# Patient Record
Sex: Female | Born: 1940 | Race: White | Hispanic: No | Marital: Married | Smoking: Never smoker
Health system: Southern US, Community
[De-identification: ages and names within clinical notes are randomized; demographics above are authoritative.]

## PROBLEM LIST (undated history)

## (undated) DIAGNOSIS — D329 Benign neoplasm of meninges, unspecified: Secondary | ICD-10-CM

## (undated) DIAGNOSIS — D649 Anemia, unspecified: Secondary | ICD-10-CM

## (undated) DIAGNOSIS — R569 Unspecified convulsions: Secondary | ICD-10-CM

## (undated) DIAGNOSIS — E119 Type 2 diabetes mellitus without complications: Secondary | ICD-10-CM

## (undated) DIAGNOSIS — M1990 Unspecified osteoarthritis, unspecified site: Secondary | ICD-10-CM

## (undated) DIAGNOSIS — E785 Hyperlipidemia, unspecified: Secondary | ICD-10-CM

## (undated) DIAGNOSIS — M109 Gout, unspecified: Secondary | ICD-10-CM

## (undated) DIAGNOSIS — K219 Gastro-esophageal reflux disease without esophagitis: Secondary | ICD-10-CM

## (undated) DIAGNOSIS — M359 Systemic involvement of connective tissue, unspecified: Secondary | ICD-10-CM

## (undated) DIAGNOSIS — L409 Psoriasis, unspecified: Secondary | ICD-10-CM

## (undated) DIAGNOSIS — E039 Hypothyroidism, unspecified: Secondary | ICD-10-CM

## (undated) DIAGNOSIS — I10 Essential (primary) hypertension: Secondary | ICD-10-CM

## (undated) DIAGNOSIS — C801 Malignant (primary) neoplasm, unspecified: Secondary | ICD-10-CM

## (undated) HISTORY — PX: THYROID SURGERY: SHX805

## (undated) HISTORY — PX: BRAIN MENINGIOMA EXCISION: SHX576

## (undated) HISTORY — PX: BRAIN SURGERY: SHX531

## (undated) HISTORY — PX: ESOPHAGOGASTRODUODENOSCOPY: SHX1529

## (undated) HISTORY — PX: PARTIAL NEPHRECTOMY: SHX414

## (undated) HISTORY — PX: APPENDECTOMY: SHX54

## (undated) HISTORY — PX: BACK SURGERY: SHX140

## (undated) HISTORY — PX: ABDOMINAL HYSTERECTOMY: SHX81

## (undated) HISTORY — PX: CRANIOTOMY: SHX93

## (undated) HISTORY — PX: REDUCTION MAMMAPLASTY: SUR839

## (undated) HISTORY — PX: COLONOSCOPY: SHX174

## (undated) HISTORY — PX: HEMORRHOID SURGERY: SHX153

---

## 2007-08-17 ENCOUNTER — Ambulatory Visit: Payer: Self-pay | Admitting: Gastroenterology

## 2007-10-22 ENCOUNTER — Ambulatory Visit: Payer: Self-pay | Admitting: Family Medicine

## 2008-06-13 ENCOUNTER — Ambulatory Visit: Payer: Self-pay | Admitting: Oncology

## 2008-06-20 ENCOUNTER — Inpatient Hospital Stay: Payer: Self-pay | Admitting: Internal Medicine

## 2008-06-24 ENCOUNTER — Ambulatory Visit: Payer: Self-pay | Admitting: Oncology

## 2008-07-04 DIAGNOSIS — K219 Gastro-esophageal reflux disease without esophagitis: Secondary | ICD-10-CM | POA: Insufficient documentation

## 2008-07-04 DIAGNOSIS — E039 Hypothyroidism, unspecified: Secondary | ICD-10-CM | POA: Insufficient documentation

## 2008-07-04 DIAGNOSIS — E7800 Pure hypercholesterolemia, unspecified: Secondary | ICD-10-CM | POA: Insufficient documentation

## 2008-07-11 ENCOUNTER — Ambulatory Visit: Payer: Self-pay | Admitting: Oncology

## 2008-08-05 ENCOUNTER — Ambulatory Visit: Payer: Self-pay | Admitting: Oncology

## 2008-10-25 ENCOUNTER — Ambulatory Visit: Payer: Self-pay | Admitting: Family Medicine

## 2008-12-27 ENCOUNTER — Ambulatory Visit: Payer: Self-pay | Admitting: Urology

## 2009-02-08 ENCOUNTER — Inpatient Hospital Stay: Payer: Self-pay | Admitting: *Deleted

## 2009-02-10 ENCOUNTER — Ambulatory Visit: Payer: Self-pay | Admitting: Oncology

## 2009-02-17 ENCOUNTER — Ambulatory Visit: Payer: Self-pay | Admitting: Oncology

## 2009-03-02 ENCOUNTER — Ambulatory Visit: Payer: Self-pay | Admitting: Family Medicine

## 2009-03-13 ENCOUNTER — Ambulatory Visit: Payer: Self-pay | Admitting: Oncology

## 2009-04-21 ENCOUNTER — Ambulatory Visit: Payer: Self-pay

## 2009-04-21 ENCOUNTER — Ambulatory Visit: Payer: Self-pay | Admitting: Oncology

## 2009-05-13 ENCOUNTER — Ambulatory Visit: Payer: Self-pay

## 2010-01-11 ENCOUNTER — Ambulatory Visit: Payer: Self-pay | Admitting: Oncology

## 2010-01-16 ENCOUNTER — Ambulatory Visit: Payer: Self-pay | Admitting: Family Medicine

## 2010-01-30 ENCOUNTER — Ambulatory Visit: Payer: Self-pay

## 2010-02-02 ENCOUNTER — Ambulatory Visit: Payer: Self-pay | Admitting: Oncology

## 2010-02-23 ENCOUNTER — Ambulatory Visit: Payer: Self-pay | Admitting: Urology

## 2011-01-21 ENCOUNTER — Ambulatory Visit: Payer: Self-pay | Admitting: Family Medicine

## 2011-01-31 ENCOUNTER — Ambulatory Visit: Payer: Self-pay

## 2011-02-25 ENCOUNTER — Ambulatory Visit: Payer: Self-pay | Admitting: Urology

## 2011-02-27 ENCOUNTER — Ambulatory Visit: Payer: Self-pay | Admitting: Oncology

## 2011-03-14 ENCOUNTER — Ambulatory Visit: Payer: Self-pay | Admitting: Oncology

## 2011-03-14 ENCOUNTER — Ambulatory Visit: Payer: Self-pay | Admitting: Urology

## 2011-03-28 DIAGNOSIS — C649 Malignant neoplasm of unspecified kidney, except renal pelvis: Secondary | ICD-10-CM | POA: Insufficient documentation

## 2011-05-14 DIAGNOSIS — C642 Malignant neoplasm of left kidney, except renal pelvis: Secondary | ICD-10-CM

## 2011-05-14 HISTORY — DX: Malignant neoplasm of left kidney, except renal pelvis: C64.2

## 2011-11-25 ENCOUNTER — Ambulatory Visit: Payer: Self-pay | Admitting: Gastroenterology

## 2012-01-22 ENCOUNTER — Ambulatory Visit: Payer: Self-pay | Admitting: Family Medicine

## 2012-02-19 ENCOUNTER — Ambulatory Visit: Payer: Self-pay

## 2012-02-26 ENCOUNTER — Ambulatory Visit: Payer: Self-pay | Admitting: Internal Medicine

## 2012-03-13 ENCOUNTER — Ambulatory Visit: Payer: Self-pay | Admitting: Internal Medicine

## 2012-04-17 DIAGNOSIS — N3946 Mixed incontinence: Secondary | ICD-10-CM | POA: Insufficient documentation

## 2012-04-17 DIAGNOSIS — N281 Cyst of kidney, acquired: Secondary | ICD-10-CM | POA: Insufficient documentation

## 2012-04-17 DIAGNOSIS — N23 Unspecified renal colic: Secondary | ICD-10-CM | POA: Insufficient documentation

## 2013-01-22 ENCOUNTER — Ambulatory Visit: Payer: Self-pay | Admitting: Family Medicine

## 2014-02-25 ENCOUNTER — Ambulatory Visit: Payer: Self-pay | Admitting: Family Medicine

## 2014-04-25 ENCOUNTER — Emergency Department: Payer: Self-pay | Admitting: Emergency Medicine

## 2014-04-25 LAB — COMPREHENSIVE METABOLIC PANEL
ALBUMIN: 3.3 g/dL — AB (ref 3.4–5.0)
AST: 20 U/L (ref 15–37)
Alkaline Phosphatase: 68 U/L
Anion Gap: 9 (ref 7–16)
BILIRUBIN TOTAL: 0.6 mg/dL (ref 0.2–1.0)
BUN: 21 mg/dL — ABNORMAL HIGH (ref 7–18)
Calcium, Total: 8.4 mg/dL — ABNORMAL LOW (ref 8.5–10.1)
Chloride: 94 mmol/L — ABNORMAL LOW (ref 98–107)
Co2: 28 mmol/L (ref 21–32)
Creatinine: 1.19 mg/dL (ref 0.60–1.30)
EGFR (African American): 57 — ABNORMAL LOW
EGFR (Non-African Amer.): 47 — ABNORMAL LOW
Glucose: 102 mg/dL — ABNORMAL HIGH (ref 65–99)
Osmolality: 266 (ref 275–301)
POTASSIUM: 3.5 mmol/L (ref 3.5–5.1)
SGPT (ALT): 37 U/L
SODIUM: 131 mmol/L — AB (ref 136–145)
Total Protein: 6.5 g/dL (ref 6.4–8.2)

## 2014-04-25 LAB — URINALYSIS, COMPLETE
BLOOD: NEGATIVE
Bilirubin,UR: NEGATIVE
Glucose,UR: NEGATIVE mg/dL (ref 0–75)
Hyaline Cast: 3
Ketone: NEGATIVE
LEUKOCYTE ESTERASE: NEGATIVE
Nitrite: NEGATIVE
PH: 5 (ref 4.5–8.0)
Protein: 30
SPECIFIC GRAVITY: 1.026 (ref 1.003–1.030)
Squamous Epithelial: 1
WBC UR: 3 /HPF (ref 0–5)

## 2014-04-25 LAB — TROPONIN I: Troponin-I: <0.02 ng/mL

## 2014-04-25 LAB — CBC WITH DIFFERENTIAL/PLATELET
BASOS PCT: 0.5 %
Basophil #: 0 10*3/uL (ref 0.0–0.1)
EOS ABS: 0.1 10*3/uL (ref 0.0–0.7)
Eosinophil %: 1.6 %
HCT: 35 % (ref 35.0–47.0)
HGB: 11.6 g/dL — ABNORMAL LOW (ref 12.0–16.0)
LYMPHS ABS: 0.9 10*3/uL — AB (ref 1.0–3.6)
Lymphocyte %: 10.6 %
MCH: 26.9 pg (ref 26.0–34.0)
MCHC: 33.1 g/dL (ref 32.0–36.0)
MCV: 81 fL (ref 80–100)
MONO ABS: 0.7 x10 3/mm (ref 0.2–0.9)
MONOS PCT: 8.7 %
NEUTROS ABS: 6.3 10*3/uL (ref 1.4–6.5)
Neutrophil %: 78.6 %
Platelet: 273 10*3/uL (ref 150–440)
RBC: 4.31 10*6/uL (ref 3.80–5.20)
RDW: 16.5 % — ABNORMAL HIGH (ref 11.5–14.5)
WBC: 8.1 10*3/uL (ref 3.6–11.0)

## 2014-05-02 ENCOUNTER — Ambulatory Visit: Payer: Self-pay | Admitting: Adult Health

## 2015-06-21 ENCOUNTER — Other Ambulatory Visit: Payer: Self-pay | Admitting: Family Medicine

## 2015-06-21 DIAGNOSIS — Z1231 Encounter for screening mammogram for malignant neoplasm of breast: Secondary | ICD-10-CM

## 2015-06-22 ENCOUNTER — Ambulatory Visit
Admission: RE | Admit: 2015-06-22 | Discharge: 2015-06-22 | Disposition: A | Discharge status: Home / Self Care | Payer: Medicare Other | Source: Ambulatory Visit | Attending: Family Medicine | Admitting: Family Medicine

## 2015-06-22 DIAGNOSIS — Z1231 Encounter for screening mammogram for malignant neoplasm of breast: Secondary | ICD-10-CM | POA: Diagnosis not present

## 2015-08-09 ENCOUNTER — Other Ambulatory Visit: Payer: Self-pay | Admitting: Orthopedic Surgery

## 2015-08-09 DIAGNOSIS — M25561 Pain in right knee: Secondary | ICD-10-CM

## 2015-08-09 DIAGNOSIS — M1711 Unilateral primary osteoarthritis, right knee: Secondary | ICD-10-CM

## 2015-08-30 ENCOUNTER — Ambulatory Visit
Admission: RE | Admit: 2015-08-30 | Discharge: 2015-08-30 | Disposition: A | Discharge status: Home / Self Care | Payer: Medicare Other | Source: Ambulatory Visit | Attending: Orthopedic Surgery | Admitting: Orthopedic Surgery

## 2015-08-30 DIAGNOSIS — M2241 Chondromalacia patellae, right knee: Secondary | ICD-10-CM | POA: Insufficient documentation

## 2015-08-30 DIAGNOSIS — S83271A Complex tear of lateral meniscus, current injury, right knee, initial encounter: Secondary | ICD-10-CM | POA: Diagnosis not present

## 2015-08-30 DIAGNOSIS — M1711 Unilateral primary osteoarthritis, right knee: Secondary | ICD-10-CM | POA: Insufficient documentation

## 2015-08-30 DIAGNOSIS — M25561 Pain in right knee: Secondary | ICD-10-CM | POA: Insufficient documentation

## 2015-09-12 ENCOUNTER — Other Ambulatory Visit: Payer: Medicare Other

## 2015-09-12 ENCOUNTER — Encounter: Payer: Self-pay | Admitting: *Deleted

## 2015-09-12 NOTE — Patient Instructions (Signed)
  Your procedure is scheduled on: 09-21-15 (THURSDAY) Report to [facility] To find out your arrival time please call [phone] between 1PM - 3PM on 09-20-15 [facility])  Remember: Instructions that are not followed completely may result in serious medical risk, up to and including death, or upon the discretion of your surgeon and anesthesiologist your surgery may need to be rescheduled.    _X___ 1. Do not eat food or drink liquids after midnight. No gum chewing or hard candies.     _X___ 2. No Alcohol for 24 hours before or after surgery.   ____ 3. Bring all medications with you on the day of surgery if instructed.    _X___ 4. Notify your doctor if there is any change in your medical condition     (cold, fever, infections).     Do not wear jewelry, make-up, hairpins, clips or nail polish.  Do not wear lotions, powders, or perfumes. You may wear deodorant.  Do not shave 48 hours prior to surgery. Men may shave face and neck.  Do not bring valuables to the hospital.    [facility] is not responsible for any belongings or valuables.               Contacts, dentures or bridgework may not be worn into surgery.  Leave your suitcase in the car. After surgery it may be brought to your room.  For patients admitted to the hospital, discharge time is determined by your treatment team.   Patients discharged the day of surgery will not be allowed to drive home.   Please read over the following fact sheets that you were given:      _X___ Take these medicines the morning of surgery with A SIP OF WATER:    1. LEVOTHYROXINE  2. KEPPRA  3. PRILOSEC  4.TAKE AN EXTRA PRILOSEC ON Wednesday NIGHT BEFORE BED  5.  6.  ____ Fleet Enema (as directed)   _X___ Use CHG Soap as directed  ____ Use inhalers on the day of surgery  ____ Stop metformin 2 days prior to surgery    ____ Take 1/2 of usual insulin dose the night before surgery and none on the morning of  surgery.   ____ Stop Coumadin/Plavix/aspirin-N/A  _X___ Stop Anti-inflammatories-NO NSAIDS OR ASPIRIN PRODUCTS-TYLENOL OK TO TAKE    ____ Stop supplements until after surgery.    ____ Bring C-Pap to the hospital.

## 2015-09-15 ENCOUNTER — Ambulatory Visit
Admission: RE | Admit: 2015-09-15 | Discharge: 2015-09-15 | Disposition: A | Discharge status: Home / Self Care | Payer: Medicare Other | Source: Ambulatory Visit | Attending: Orthopedic Surgery | Admitting: Orthopedic Surgery

## 2015-09-15 DIAGNOSIS — Z01812 Encounter for preprocedural laboratory examination: Secondary | ICD-10-CM | POA: Diagnosis not present

## 2015-09-15 DIAGNOSIS — Z01810 Encounter for preprocedural cardiovascular examination: Secondary | ICD-10-CM | POA: Diagnosis present

## 2015-09-15 DIAGNOSIS — I10 Essential (primary) hypertension: Secondary | ICD-10-CM | POA: Diagnosis present

## 2015-09-15 LAB — POTASSIUM: POTASSIUM: 3.8 mmol/L (ref 3.5–5.1)

## 2015-09-21 ENCOUNTER — Encounter: Payer: Self-pay | Admitting: *Deleted

## 2015-09-21 ENCOUNTER — Ambulatory Visit: Payer: Medicare Other | Admitting: Anesthesiology

## 2015-09-21 ENCOUNTER — Encounter
Admission: RE | Disposition: A | Discharge status: Home / Self Care | Payer: Self-pay | Source: Ambulatory Visit | Attending: Orthopedic Surgery

## 2015-09-21 ENCOUNTER — Ambulatory Visit
Admission: RE | Admit: 2015-09-21 | Discharge: 2015-09-21 | Disposition: A | Discharge status: Home / Self Care | Payer: Medicare Other | Source: Ambulatory Visit | Attending: Orthopedic Surgery | Admitting: Orthopedic Surgery

## 2015-09-21 DIAGNOSIS — Z803 Family history of malignant neoplasm of breast: Secondary | ICD-10-CM | POA: Diagnosis not present

## 2015-09-21 DIAGNOSIS — G40409 Other generalized epilepsy and epileptic syndromes, not intractable, without status epilepticus: Secondary | ICD-10-CM | POA: Insufficient documentation

## 2015-09-21 DIAGNOSIS — D649 Anemia, unspecified: Secondary | ICD-10-CM | POA: Insufficient documentation

## 2015-09-21 DIAGNOSIS — Z905 Acquired absence of kidney: Secondary | ICD-10-CM | POA: Diagnosis not present

## 2015-09-21 DIAGNOSIS — Z79899 Other long term (current) drug therapy: Secondary | ICD-10-CM | POA: Diagnosis not present

## 2015-09-21 DIAGNOSIS — Z800 Family history of malignant neoplasm of digestive organs: Secondary | ICD-10-CM | POA: Insufficient documentation

## 2015-09-21 DIAGNOSIS — E785 Hyperlipidemia, unspecified: Secondary | ICD-10-CM | POA: Insufficient documentation

## 2015-09-21 DIAGNOSIS — E119 Type 2 diabetes mellitus without complications: Secondary | ICD-10-CM | POA: Insufficient documentation

## 2015-09-21 DIAGNOSIS — Z801 Family history of malignant neoplasm of trachea, bronchus and lung: Secondary | ICD-10-CM | POA: Diagnosis not present

## 2015-09-21 DIAGNOSIS — Z825 Family history of asthma and other chronic lower respiratory diseases: Secondary | ICD-10-CM | POA: Diagnosis not present

## 2015-09-21 DIAGNOSIS — Z8261 Family history of arthritis: Secondary | ICD-10-CM | POA: Diagnosis not present

## 2015-09-21 DIAGNOSIS — L409 Psoriasis, unspecified: Secondary | ICD-10-CM | POA: Diagnosis not present

## 2015-09-21 DIAGNOSIS — Z8553 Personal history of malignant neoplasm of renal pelvis: Secondary | ICD-10-CM | POA: Diagnosis not present

## 2015-09-21 DIAGNOSIS — S83281A Other tear of lateral meniscus, current injury, right knee, initial encounter: Secondary | ICD-10-CM | POA: Diagnosis not present

## 2015-09-21 DIAGNOSIS — Z888 Allergy status to other drugs, medicaments and biological substances status: Secondary | ICD-10-CM | POA: Insufficient documentation

## 2015-09-21 DIAGNOSIS — E669 Obesity, unspecified: Secondary | ICD-10-CM | POA: Insufficient documentation

## 2015-09-21 DIAGNOSIS — Y939 Activity, unspecified: Secondary | ICD-10-CM | POA: Diagnosis not present

## 2015-09-21 DIAGNOSIS — Z808 Family history of malignant neoplasm of other organs or systems: Secondary | ICD-10-CM | POA: Diagnosis not present

## 2015-09-21 DIAGNOSIS — Z833 Family history of diabetes mellitus: Secondary | ICD-10-CM | POA: Diagnosis not present

## 2015-09-21 DIAGNOSIS — X58XXXA Exposure to other specified factors, initial encounter: Secondary | ICD-10-CM | POA: Insufficient documentation

## 2015-09-21 DIAGNOSIS — I10 Essential (primary) hypertension: Secondary | ICD-10-CM | POA: Diagnosis not present

## 2015-09-21 DIAGNOSIS — Z90710 Acquired absence of both cervix and uterus: Secondary | ICD-10-CM | POA: Insufficient documentation

## 2015-09-21 DIAGNOSIS — Z6829 Body mass index (BMI) 29.0-29.9, adult: Secondary | ICD-10-CM | POA: Diagnosis not present

## 2015-09-21 DIAGNOSIS — S83241A Other tear of medial meniscus, current injury, right knee, initial encounter: Secondary | ICD-10-CM | POA: Diagnosis not present

## 2015-09-21 DIAGNOSIS — Z7984 Long term (current) use of oral hypoglycemic drugs: Secondary | ICD-10-CM | POA: Insufficient documentation

## 2015-09-21 DIAGNOSIS — E890 Postprocedural hypothyroidism: Secondary | ICD-10-CM | POA: Diagnosis not present

## 2015-09-21 HISTORY — DX: Psoriasis, unspecified: L40.9

## 2015-09-21 HISTORY — DX: Anemia, unspecified: D64.9

## 2015-09-21 HISTORY — DX: Hyperlipidemia, unspecified: E78.5

## 2015-09-21 HISTORY — DX: Type 2 diabetes mellitus without complications: E11.9

## 2015-09-21 HISTORY — DX: Gastro-esophageal reflux disease without esophagitis: K21.9

## 2015-09-21 HISTORY — DX: Essential (primary) hypertension: I10

## 2015-09-21 HISTORY — DX: Unspecified convulsions: R56.9

## 2015-09-21 HISTORY — DX: Unspecified osteoarthritis, unspecified site: M19.90

## 2015-09-21 HISTORY — DX: Hypothyroidism, unspecified: E03.9

## 2015-09-21 HISTORY — DX: Malignant (primary) neoplasm, unspecified: C80.1

## 2015-09-21 HISTORY — PX: KNEE ARTHROSCOPY: SHX127

## 2015-09-21 LAB — GLUCOSE, CAPILLARY
GLUCOSE-CAPILLARY: 80 mg/dL (ref 65–99)
GLUCOSE-CAPILLARY: 81 mg/dL (ref 65–99)

## 2015-09-21 SURGERY — ARTHROSCOPY, KNEE
Anesthesia: General | Site: Knee | Laterality: Right

## 2015-09-21 MED ORDER — LACTATED RINGERS IV SOLN
INTRAVENOUS | Status: DC | PRN
  Administered 2015-09-21: 11:00:00 via INTRAVENOUS

## 2015-09-21 MED ORDER — KETOROLAC TROMETHAMINE 30 MG/ML IJ SOLN
INTRAMUSCULAR | Status: DC | PRN
  Administered 2015-09-21: 30 mg via INTRAVENOUS

## 2015-09-21 MED ORDER — BUPIVACAINE-EPINEPHRINE (PF) 0.5% -1:200000 IJ SOLN
INTRAMUSCULAR | Status: AC
  Filled 2015-09-21: qty 30

## 2015-09-21 MED ORDER — SODIUM CHLORIDE 0.9 % IV SOLN
INTRAVENOUS | Status: DC
  Administered 2015-09-21: 11:00:00 via INTRAVENOUS

## 2015-09-21 MED ORDER — FENTANYL CITRATE (PF) 100 MCG/2ML IJ SOLN
INTRAMUSCULAR | Status: DC | PRN
  Administered 2015-09-21: 100 ug via INTRAVENOUS

## 2015-09-21 MED ORDER — PROPOFOL 10 MG/ML IV BOLUS
INTRAVENOUS | Status: DC | PRN
Start: 2015-09-21 — End: 2015-09-21
  Administered 2015-09-21: 40 mg via INTRAVENOUS
  Administered 2015-09-21: 150 mg via INTRAVENOUS

## 2015-09-21 MED ORDER — FENTANYL CITRATE (PF) 100 MCG/2ML IJ SOLN: 25.0000 ug | INTRAMUSCULAR | Status: DC | PRN

## 2015-09-21 MED ORDER — BUPIVACAINE-EPINEPHRINE (PF) 0.5% -1:200000 IJ SOLN
INTRAMUSCULAR | Status: DC | PRN
  Administered 2015-09-21: 20 mL

## 2015-09-21 MED ORDER — FENTANYL CITRATE (PF) 100 MCG/2ML IJ SOLN
25.0000 ug | INTRAMUSCULAR | Status: DC | PRN
  Administered 2015-09-21 (×2): 25 ug via INTRAVENOUS

## 2015-09-21 MED ORDER — LIDOCAINE HCL (CARDIAC) 20 MG/ML IV SOLN
INTRAVENOUS | Status: DC | PRN
  Administered 2015-09-21: 100 mg via INTRAVENOUS

## 2015-09-21 MED ORDER — ONDANSETRON HCL 4 MG/2ML IJ SOLN
4.0000 mg | Freq: Once | INTRAMUSCULAR | Status: AC | PRN
  Administered 2015-09-21: 4 mg via INTRAVENOUS

## 2015-09-21 MED ORDER — HYDROCODONE-ACETAMINOPHEN 5-325 MG PO TABS: 1.0000 | ORAL_TABLET | Freq: Four times a day (QID) | ORAL | Status: DC | PRN

## 2015-09-21 MED ORDER — ONDANSETRON HCL 4 MG/2ML IJ SOLN: 4.0000 mg | Freq: Once | INTRAMUSCULAR | Status: DC | PRN

## 2015-09-21 MED ORDER — FENTANYL CITRATE (PF) 100 MCG/2ML IJ SOLN
INTRAMUSCULAR | Status: AC
  Administered 2015-09-21: 25 ug via INTRAVENOUS
  Filled 2015-09-21: qty 2

## 2015-09-21 SURGICAL SUPPLY — 27 items
BANDAGE ACE 4X5 VEL STRL LF (GAUZE/BANDAGES/DRESSINGS) ×3 IMPLANT
BANDAGE ELASTIC 4 LF NS (GAUZE/BANDAGES/DRESSINGS) ×3 IMPLANT
BLADE FULL RADIUS 3.5 (BLADE) IMPLANT
BLADE INCISOR PLUS 4.5 (BLADE) ×3 IMPLANT
BLADE SHAVER 4.5 DBL SERAT CV (CUTTER) IMPLANT
BLADE SHAVER 4.5X7 STR FR (MISCELLANEOUS) IMPLANT
CHLORAPREP W/TINT 26ML (MISCELLANEOUS) ×3 IMPLANT
CUFF TOURN 24 STER (MISCELLANEOUS) ×3 IMPLANT
CUFF TOURN 30 STER DUAL PORT (MISCELLANEOUS) IMPLANT
CUTTER AGGRESSIVE+ 3.5 (CUTTER) ×3 IMPLANT
GAUZE SPONGE 4X4 12PLY STRL (GAUZE/BANDAGES/DRESSINGS) ×3 IMPLANT
GLOVE SURG ORTHO 9.0 STRL STRW (GLOVE) ×3 IMPLANT
GOWN STRL REUS W/ TWL LRG LVL3 (GOWN DISPOSABLE) ×1 IMPLANT
GOWN STRL REUS W/TWL LRG LVL3 (GOWN DISPOSABLE) ×2
GOWN SURG XXL (GOWNS) ×3 IMPLANT
IV LACTATED RINGER IRRG 3000ML (IV SOLUTION) ×8
IV LR IRRIG 3000ML ARTHROMATIC (IV SOLUTION) ×4 IMPLANT
KIT RM TURNOVER STRD PROC AR (KITS) ×3 IMPLANT
MANIFOLD NEPTUNE II (INSTRUMENTS) ×3 IMPLANT
PACK ARTHROSCOPY KNEE (MISCELLANEOUS) ×3 IMPLANT
SET TUBE SUCT SHAVER OUTFL 24K (TUBING) ×3 IMPLANT
SET TUBE TIP INTRA-ARTICULAR (MISCELLANEOUS) ×3 IMPLANT
SUT ETHILON 4-0 (SUTURE) ×2
SUT ETHILON 4-0 FS2 18XMFL BLK (SUTURE) ×1
SUTURE ETHLN 4-0 FS2 18XMF BLK (SUTURE) ×1 IMPLANT
TUBING ARTHRO INFLOW-ONLY STRL (TUBING) ×3 IMPLANT
WAND HAND CNTRL MULTIVAC 50 (MISCELLANEOUS) ×3 IMPLANT

## 2015-09-21 NOTE — H&P (Signed)
Reviewed paper H+P, will be scanned into chart. No changes noted.  

## 2015-09-21 NOTE — Anesthesia Postprocedure Evaluation (Signed)
Anesthesia Post Note  Patient: Vanessa Esparza  Procedure(s) Performed: Procedure(s) (LRB): ARTHROSCOPY KNEE, LATERAL AND MEDIAL MENISECTOMY (Right)  Patient location during evaluation: PACU Anesthesia Type: General Level of consciousness: awake Pain management: pain level controlled Vital Signs Assessment: post-procedure vital signs reviewed and stable Respiratory status: spontaneous breathing Cardiovascular status: blood pressure returned to baseline Anesthetic complications: no    Last Vitals:  Filed Vitals:   09/21/15 1028 09/21/15 1147  BP: 164/71 103/60  Pulse: 63 68  Temp: 36.5 C 36.1 C  Resp: 20 13    Last Pain:  Filed Vitals:   09/21/15 1154  PainSc: Asleep                 VAN STAVEREN,Umaiza Matusik

## 2015-09-21 NOTE — Anesthesia Procedure Notes (Signed)
Procedure Name: LMA Insertion Date/Time: 09/21/2015 11:20 AM Performed by: Nelda Marseille Pre-anesthesia Checklist: Patient identified, Patient being monitored, Timeout performed, Emergency Drugs available and Suction available Patient Re-evaluated:Patient Re-evaluated prior to inductionOxygen Delivery Method: Circle system utilized Preoxygenation: Pre-oxygenation with 100% oxygen Intubation Type: IV induction Ventilation: Mask ventilation without difficulty LMA: LMA inserted LMA Size: 3.5 Tube type: Oral Number of attempts: 1 Placement Confirmation: positive ETCO2 and breath sounds checked- equal and bilateral Tube secured with: Tape Dental Injury: Teeth and Oropharynx as per pre-operative assessment

## 2015-09-21 NOTE — Transfer of Care (Signed)
Immediate Anesthesia Transfer of Care Note  Patient: Vanessa Esparza  Procedure(s) Performed: Procedure(s): ARTHROSCOPY KNEE, LATERAL AND MEDIAL MENISECTOMY (Right)  Patient Location: PACU  Anesthesia Type:General  Level of Consciousness: sedated  Airway & Oxygen Therapy: Patient Spontanous Breathing and Patient connected to face mask oxygen  Post-op Assessment: Report given to RN and Post -op Vital signs reviewed and stable  Post vital signs: Reviewed and stable  Last Vitals:  Filed Vitals:   09/21/15 1028  BP: 164/71  Pulse: 63  Temp: 36.5 C  Resp: 20    Last Pain: There were no vitals filed for this visit.       Complications: No apparent anesthesia complications

## 2015-09-21 NOTE — Anesthesia Preprocedure Evaluation (Addendum)
Anesthesia Evaluation  Patient identified by MRN, date of birth, ID band Patient awake    Reviewed: Allergy & Precautions, NPO status , Patient's Chart, lab work & pertinent test results  History of Anesthesia Complications Negative for: history of anesthetic complications  Airway Mallampati: III       Dental  (+) Teeth Intact   Pulmonary neg pulmonary ROS,    breath sounds clear to auscultation       Cardiovascular Exercise Tolerance: Good hypertension, Pt. on medications  Rhythm:Regular Rate:Normal     Neuro/Psych Seizures -, Well Controlled,     GI/Hepatic Neg liver ROS, GERD  Medicated,  Endo/Other  diabetes, Well Controlled, Type 2, Oral Hypoglycemic AgentsHypothyroidism   Renal/GU Partial Nephrectomy     Musculoskeletal   Abdominal (+) + obese,   Peds  Hematology  (+) anemia ,   Anesthesia Other Findings   Reproductive/Obstetrics                            Anesthesia Physical Anesthesia Plan  ASA: II  Anesthesia Plan: General   Post-op Pain Management:    Induction: Intravenous  Airway Management Planned: LMA  Additional Equipment:   Intra-op Plan:   Post-operative Plan: Extubation in OR  Informed Consent: I have reviewed the patients History and Physical, chart, labs and discussed the procedure including the risks, benefits and alternatives for the proposed anesthesia with the patient or authorized representative who has indicated his/her understanding and acceptance.     Plan Discussed with: CRNA  Anesthesia Plan Comments:         Anesthesia Quick Evaluation

## 2015-09-21 NOTE — Op Note (Signed)
09/21/2015  11:45 AM  PATIENT:  Vanessa Esparza  75 y.o. female  PRE-OPERATIVE DIAGNOSIS:  LATERAL MENISCUS TEAR, POSSIBLE MEDIAL  POST-OPERATIVE DIAGNOSIS:  LATERAL AND MEDIAL MENISCUS TEAR  PROCEDURE:  Procedure(s): ARTHROSCOPY KNEE, LATERAL AND MEDIAL MENISECTOMY (Right)  SURGEON: Laurene Footman, MD  ASSISTANTS: None  ANESTHESIA:   general  EBL:     BLOOD ADMINISTERED:none  DRAINS: none   LOCAL MEDICATIONS USED:  MARCAINE     SPECIMEN:  No Specimen  DISPOSITION OF SPECIMEN:  N/A  COUNTS:  YES  TOURNIQUET:    IMPLANTS: None  DICTATION: .Dragon Dictation patient was brought to the operating room and after adequate general anesthesia was obtained the right leg was prepped draped in sterile fashion with an arthroscopic leg holder applied. After prepping and draping in the usual sterile manner appropriate patient identification and timeout procedure were completed. An inferior lateral portal was made and then initial inspection the patellofemoral joint appeared normal. Coming around medially and inferior medial portal was made and there is moderate synovitis and a small tear at the middle third that was debrided with use of a ArthriCare wand the anterior cruciate ligament was intact them articular cartilage was relatively normal the medial compartment laterally there was some articular cartilage loss superficially on the femur and extensive fibrillation of the tibial side there was huge tear of the lateral meniscus bucket-handle tear which is displaced and attached this was debrided with a shaver getting the bulk of the meniscus removed and then ArthriCare wand was used to shave the meniscus getting back to stable margins with essentially the entire meniscus having been involved in the tear after adequate debridement and addressing the meniscus tear circumferentially the knee was irrigated until clear the gutters were checked and are no loose bodies after thorough irrigation  argentation was withdrawn. Wounds were closed with simple 4-0 nylon and 10 cc of half percent Sensorcaine with epinephrine infiltrated into each portal. Xeroform 4 x 4 web roll and Ace wrap applied  PLAN OF CARE: Discharge to home after PACU  PATIENT DISPOSITION:  PACU - hemodynamically stable.

## 2015-09-21 NOTE — Discharge Instructions (Signed)
Take aspirin 325 mg a day until walking normally. Keep dressing clean and dry. If bandage slides down the leg, remove entire bandage put a Band-Aid over each of the 2 incisions and then reapply the Ace wrap only. Pain medicine as directed

## 2015-09-22 ENCOUNTER — Encounter: Payer: Self-pay | Admitting: Orthopedic Surgery

## 2016-08-16 ENCOUNTER — Ambulatory Visit (INDEPENDENT_AMBULATORY_CARE_PROVIDER_SITE_OTHER): Payer: Medicare Other | Admitting: Podiatry

## 2016-08-16 ENCOUNTER — Encounter: Payer: Self-pay | Admitting: Podiatry

## 2016-08-16 DIAGNOSIS — L409 Psoriasis, unspecified: Secondary | ICD-10-CM | POA: Diagnosis not present

## 2016-08-16 DIAGNOSIS — E785 Hyperlipidemia, unspecified: Secondary | ICD-10-CM | POA: Insufficient documentation

## 2016-08-16 DIAGNOSIS — M79675 Pain in left toe(s): Secondary | ICD-10-CM | POA: Diagnosis not present

## 2016-08-16 DIAGNOSIS — I10 Essential (primary) hypertension: Secondary | ICD-10-CM | POA: Insufficient documentation

## 2016-08-16 DIAGNOSIS — E079 Disorder of thyroid, unspecified: Secondary | ICD-10-CM | POA: Insufficient documentation

## 2016-08-16 DIAGNOSIS — D649 Anemia, unspecified: Secondary | ICD-10-CM | POA: Insufficient documentation

## 2016-08-16 DIAGNOSIS — E119 Type 2 diabetes mellitus without complications: Secondary | ICD-10-CM | POA: Insufficient documentation

## 2016-08-16 DIAGNOSIS — L603 Nail dystrophy: Secondary | ICD-10-CM

## 2016-08-16 MED ORDER — DOXYCYCLINE HYCLATE 100 MG PO TABS: 100.0000 mg | ORAL_TABLET | Freq: Two times a day (BID) | ORAL | 0 refills | Status: DC

## 2016-08-16 NOTE — Progress Notes (Signed)
   Subjective:    Patient ID: Vanessa Esparza, female    DOB: [birth year], 76 y.o.   MRN: [id number]  HPI    Review of Systems  Skin: Positive for rash.  All other systems reviewed and are negative.      Objective:   Physical Exam        Assessment & Plan:

## 2016-08-16 NOTE — Patient Instructions (Signed)

## 2016-08-16 NOTE — Progress Notes (Signed)
Patient ID: Vanessa Esparza, female   DOB: 1941-04-02, 76 y.o.   MRN: [id number]   Subjective:  Patient presents today as a new patient for evaluation of thickened, painful toenails 2-5 of the left foot. Patient states that they've been thick and painful for several years now. Onset is gradual. Aggravating factors include shoe gear. Patient is unable to walk in shoes without pain. Patient tries to trim her nails however she cannot due to the thickness. She is requesting removal of the toenails. Patient states that she does have a history of psoriasis.    Objective/Physical Exam General: The patient is alert and oriented x3 in no acute distress.  Dermatology: Thickened, discolored nails noted 2-5 of the left foot.  Vascular: Palpable pedal pulses bilaterally. No edema or erythema noted. Capillary refill within normal limits.  Neurological: Epicritic and protective threshold grossly intact bilaterally.   Musculoskeletal Exam: Range of motion within normal limits to all pedal and ankle joints bilateral. Muscle strength 5/5 in all groups bilateral.    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 #2 today the decision was made to perform total permanent nail avulsions to digits 2-3 of the left foot. We will perform total permanent nail avulsions to digits 4-5 left foot on her follow-up appointment in 2 weeks. #3 local anesthesia infiltration was utilized consisting of a 50-50 mixture of 2% lidocaine plain with 0.5% Marcaine plain as a digital block 2-3 left foot. #4 nails were avulsed followed by 782 seconds applications of phenol and alcohol flush. #5 dry sterile dressings applied #6 prescription for doxycycline #7 post-care instructions were provided #8 return to clinic in 2 weeks for removal of toenails digits 4-5 left foot   Edrick Kins, DPM [facility]  Dr. Edrick Kins,  [address]                                        [place], [place] [address]                Office [phone]  Fax [phone]

## 2016-09-03 ENCOUNTER — Ambulatory Visit: Payer: Medicare Other | Admitting: Podiatry

## 2016-09-10 ENCOUNTER — Ambulatory Visit (INDEPENDENT_AMBULATORY_CARE_PROVIDER_SITE_OTHER): Payer: Medicare Other | Admitting: Podiatry

## 2016-09-10 DIAGNOSIS — L409 Psoriasis, unspecified: Secondary | ICD-10-CM | POA: Diagnosis not present

## 2016-09-10 DIAGNOSIS — L603 Nail dystrophy: Secondary | ICD-10-CM

## 2016-09-10 DIAGNOSIS — M79675 Pain in left toe(s): Secondary | ICD-10-CM | POA: Diagnosis not present

## 2016-09-10 MED ORDER — DOXYCYCLINE HYCLATE 100 MG PO TABS: 100.0000 mg | ORAL_TABLET | Freq: Two times a day (BID) | ORAL | 0 refills | Status: DC

## 2016-09-11 NOTE — Progress Notes (Signed)
Patient ID: Vanessa Esparza, female   DOB: [birth year], 76 y.o.   MRN: [id number]   Subjective:  Patient presents today for follow up evaluation of thickened, painful toenails 4-5 of the left foot. Patient states that they've been thick and painful for several years now. Onset is gradual. Aggravating factors include shoe gear. Patient is unable to walk in shoes without pain. Patient tries to trim her nails however she cannot due to the thickness. She is requesting removal of the toenails. She had toenails 2 and 3 removed at last visit and reports some continued tenderness with palpation of the areas. She has been using antibiotic ointment as direct.    Objective/Physical Exam General: The patient is alert and oriented x3 in no acute distress.  Dermatology: Thickened, discolored nails noted 4-5 of the left foot.  Vascular: Palpable pedal pulses bilaterally. No edema or erythema noted. Capillary refill within normal limits.  Neurological: Epicritic and protective threshold grossly intact bilaterally.   Musculoskeletal Exam: Range of motion within normal limits to all pedal and ankle joints bilateral. Muscle strength 5/5 in all groups bilateral.    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 #2 today the decision was made to perform total permanent nail avulsions to digits 4-5 of the left foot. #3 local anesthesia infiltration was utilized consisting of a 50-50 mixture of 2% lidocaine plain with 0.5% Marcaine plain as a digital block to toes 4-5 left foot. #4 nails were avulsed followed by 629 seconds applications of phenol and alcohol flush. #5 dry sterile dressings applied #6 prescription for doxycycline #14 given #7 post-care instructions were provided #8 return to clinic in 2 weeks   Edrick Kins, DPM [facility]  Dr. Edrick Kins, [address]                                          [place], [place] [address]                Office [phone]  Fax [phone]

## 2016-09-17 ENCOUNTER — Other Ambulatory Visit: Payer: Self-pay | Admitting: Gastroenterology

## 2016-09-17 DIAGNOSIS — R1310 Dysphagia, unspecified: Secondary | ICD-10-CM

## 2016-09-20 ENCOUNTER — Ambulatory Visit
Admission: RE | Admit: 2016-09-20 | Discharge: 2016-09-20 | Disposition: A | Discharge status: Home / Self Care | Payer: Medicare Other | Source: Ambulatory Visit | Attending: Gastroenterology | Admitting: Gastroenterology

## 2016-09-20 DIAGNOSIS — K449 Diaphragmatic hernia without obstruction or gangrene: Secondary | ICD-10-CM | POA: Insufficient documentation

## 2016-09-20 DIAGNOSIS — R1310 Dysphagia, unspecified: Secondary | ICD-10-CM | POA: Diagnosis not present

## 2016-09-24 ENCOUNTER — Ambulatory Visit (INDEPENDENT_AMBULATORY_CARE_PROVIDER_SITE_OTHER): Payer: Medicare Other | Admitting: Podiatry

## 2016-09-24 DIAGNOSIS — S91209D Unspecified open wound of unspecified toe(s) with damage to nail, subsequent encounter: Secondary | ICD-10-CM

## 2016-09-24 DIAGNOSIS — S91109D Unspecified open wound of unspecified toe(s) without damage to nail, subsequent encounter: Secondary | ICD-10-CM

## 2016-09-24 DIAGNOSIS — M79676 Pain in unspecified toe(s): Secondary | ICD-10-CM | POA: Diagnosis not present

## 2016-09-25 NOTE — Progress Notes (Signed)
   Subjective: Patient presents today 2 weeks post ingrown nail total permanent nail avulsion procedure. Patient states that the toes and nail fold is feeling much better. She reports mild drainage.  Objective: Skin is warm, dry and supple. Nail and respective nail fold appears to be healing appropriately. Open wound to the associated nail fold with a granular wound base and moderate amount of fibrotic tissue. Minimal drainage noted. Mild erythema around the periungual region likely due to phenol chemical matricectomy.  Assessment: #1 postop permanent total nail avulsion digits 2-5 left #2 open wound periungual nail fold of respective digit.   Plan of care: #1 patient was evaluated  #2 debridement of open wound was performed to the periungual border of the respective toe using a currette. Antibiotic ointment and Band-Aid was applied. #3 patient is to return to clinic on a PRN  basis.   Edrick Kins, DPM [facility]  Dr. Edrick Kins, [address]                                        [place], [place] [address]                Office [phone]  Fax [phone]

## 2016-09-30 ENCOUNTER — Other Ambulatory Visit: Payer: Self-pay | Admitting: Family Medicine

## 2016-09-30 DIAGNOSIS — Z85528 Personal history of other malignant neoplasm of kidney: Secondary | ICD-10-CM | POA: Insufficient documentation

## 2016-09-30 DIAGNOSIS — Z1231 Encounter for screening mammogram for malignant neoplasm of breast: Secondary | ICD-10-CM

## 2016-10-01 DIAGNOSIS — M0609 Rheumatoid arthritis without rheumatoid factor, multiple sites: Secondary | ICD-10-CM | POA: Insufficient documentation

## 2016-10-17 ENCOUNTER — Ambulatory Visit
Admission: RE | Admit: 2016-10-17 | Discharge: 2016-10-17 | Disposition: A | Discharge status: Home / Self Care | Payer: Medicare Other | Source: Ambulatory Visit | Attending: Family Medicine | Admitting: Family Medicine

## 2016-10-17 DIAGNOSIS — Z1231 Encounter for screening mammogram for malignant neoplasm of breast: Secondary | ICD-10-CM | POA: Diagnosis not present

## 2017-01-28 ENCOUNTER — Ambulatory Visit
Admission: RE | Admit: 2017-01-28 | Discharge: 2017-01-28 | Disposition: A | Discharge status: Home / Self Care | Payer: Medicare Other | Source: Ambulatory Visit | Attending: Gastroenterology | Admitting: Gastroenterology

## 2017-01-28 ENCOUNTER — Ambulatory Visit: Payer: Medicare Other | Admitting: Anesthesiology

## 2017-01-28 ENCOUNTER — Encounter
Admission: RE | Disposition: A | Discharge status: Home / Self Care | Payer: Self-pay | Source: Ambulatory Visit | Attending: Gastroenterology

## 2017-01-28 DIAGNOSIS — K449 Diaphragmatic hernia without obstruction or gangrene: Secondary | ICD-10-CM | POA: Insufficient documentation

## 2017-01-28 DIAGNOSIS — K635 Polyp of colon: Secondary | ICD-10-CM | POA: Diagnosis not present

## 2017-01-28 DIAGNOSIS — Z7984 Long term (current) use of oral hypoglycemic drugs: Secondary | ICD-10-CM | POA: Diagnosis not present

## 2017-01-28 DIAGNOSIS — Z79899 Other long term (current) drug therapy: Secondary | ICD-10-CM | POA: Insufficient documentation

## 2017-01-28 DIAGNOSIS — R569 Unspecified convulsions: Secondary | ICD-10-CM | POA: Diagnosis not present

## 2017-01-28 DIAGNOSIS — Q398 Other congenital malformations of esophagus: Secondary | ICD-10-CM | POA: Diagnosis not present

## 2017-01-28 DIAGNOSIS — D123 Benign neoplasm of transverse colon: Secondary | ICD-10-CM | POA: Insufficient documentation

## 2017-01-28 DIAGNOSIS — K5730 Diverticulosis of large intestine without perforation or abscess without bleeding: Secondary | ICD-10-CM | POA: Insufficient documentation

## 2017-01-28 DIAGNOSIS — Z800 Family history of malignant neoplasm of digestive organs: Secondary | ICD-10-CM | POA: Diagnosis not present

## 2017-01-28 DIAGNOSIS — K2950 Unspecified chronic gastritis without bleeding: Secondary | ICD-10-CM | POA: Insufficient documentation

## 2017-01-28 DIAGNOSIS — L409 Psoriasis, unspecified: Secondary | ICD-10-CM | POA: Insufficient documentation

## 2017-01-28 DIAGNOSIS — K640 First degree hemorrhoids: Secondary | ICD-10-CM | POA: Diagnosis not present

## 2017-01-28 DIAGNOSIS — E119 Type 2 diabetes mellitus without complications: Secondary | ICD-10-CM | POA: Insufficient documentation

## 2017-01-28 DIAGNOSIS — R1310 Dysphagia, unspecified: Secondary | ICD-10-CM | POA: Insufficient documentation

## 2017-01-28 DIAGNOSIS — Z1211 Encounter for screening for malignant neoplasm of colon: Secondary | ICD-10-CM | POA: Diagnosis not present

## 2017-01-28 DIAGNOSIS — K219 Gastro-esophageal reflux disease without esophagitis: Secondary | ICD-10-CM | POA: Insufficient documentation

## 2017-01-28 DIAGNOSIS — I10 Essential (primary) hypertension: Secondary | ICD-10-CM | POA: Diagnosis not present

## 2017-01-28 DIAGNOSIS — K319 Disease of stomach and duodenum, unspecified: Secondary | ICD-10-CM | POA: Insufficient documentation

## 2017-01-28 DIAGNOSIS — E039 Hypothyroidism, unspecified: Secondary | ICD-10-CM | POA: Insufficient documentation

## 2017-01-28 HISTORY — PX: ESOPHAGOGASTRODUODENOSCOPY (EGD) WITH PROPOFOL: SHX5813

## 2017-01-28 HISTORY — PX: COLONOSCOPY WITH PROPOFOL: SHX5780

## 2017-01-28 LAB — GLUCOSE, CAPILLARY: Glucose-Capillary: 108 mg/dL — ABNORMAL HIGH (ref 65–99)

## 2017-01-28 SURGERY — COLONOSCOPY WITH PROPOFOL
Anesthesia: General

## 2017-01-28 MED ORDER — GLYCOPYRROLATE 0.2 MG/ML IJ SOLN
INTRAMUSCULAR | Status: DC | PRN
  Administered 2017-01-28: 0.2 mg via INTRAVENOUS

## 2017-01-28 MED ORDER — LIDOCAINE HCL (PF) 2 % IJ SOLN
INTRAMUSCULAR | Status: AC
  Filled 2017-01-28: qty 4

## 2017-01-28 MED ORDER — PHENYLEPHRINE HCL 10 MG/ML IJ SOLN
INTRAMUSCULAR | Status: DC | PRN
  Administered 2017-01-28: 200 ug via INTRAVENOUS
  Administered 2017-01-28: 100 ug via INTRAVENOUS
  Administered 2017-01-28 (×2): 200 ug via INTRAVENOUS

## 2017-01-28 MED ORDER — GLYCOPYRROLATE 0.2 MG/ML IJ SOLN
INTRAMUSCULAR | Status: AC
  Filled 2017-01-28: qty 1

## 2017-01-28 MED ORDER — SODIUM CHLORIDE 0.9 % IV SOLN
INTRAVENOUS | Status: DC
Start: 2017-01-28 — End: 2017-01-28

## 2017-01-28 MED ORDER — PROPOFOL 500 MG/50ML IV EMUL
INTRAVENOUS | Status: AC
  Filled 2017-01-28: qty 50

## 2017-01-28 MED ORDER — LIDOCAINE HCL (CARDIAC) 20 MG/ML IV SOLN
INTRAVENOUS | Status: DC | PRN
Start: 2017-01-28 — End: 2017-01-28
  Administered 2017-01-28: 80 mg via INTRAVENOUS

## 2017-01-28 MED ORDER — SODIUM CHLORIDE 0.9 % IV SOLN
INTRAVENOUS | Status: DC
Start: 2017-01-28 — End: 2017-01-28
  Administered 2017-01-28: 08:00:00 via INTRAVENOUS

## 2017-01-28 MED ORDER — PROPOFOL 500 MG/50ML IV EMUL
INTRAVENOUS | Status: DC | PRN
  Administered 2017-01-28: 150 ug/kg/min via INTRAVENOUS

## 2017-01-28 MED ORDER — PROPOFOL 10 MG/ML IV BOLUS
INTRAVENOUS | Status: DC | PRN
  Administered 2017-01-28: 80 mg via INTRAVENOUS

## 2017-01-28 NOTE — Anesthesia Postprocedure Evaluation (Signed)
Anesthesia Post Note  Patient: Vanessa Esparza  Procedure(s) Performed: Procedure(s) (LRB): COLONOSCOPY WITH PROPOFOL (N/A) ESOPHAGOGASTRODUODENOSCOPY (EGD) WITH PROPOFOL (N/A)  Patient location during evaluation: Endoscopy Anesthesia Type: General Level of consciousness: awake and alert Pain management: pain level controlled Vital Signs Assessment: post-procedure vital signs reviewed and stable Respiratory status: spontaneous breathing, nonlabored ventilation, respiratory function stable and patient connected to nasal cannula oxygen Cardiovascular status: blood pressure returned to baseline and stable Postop Assessment: no apparent nausea or vomiting Anesthetic complications: no     Last Vitals:  Vitals:   01/28/17 1007 01/28/17 1017  BP: (!) 120/56 130/73  Pulse: 75 68  Resp: 14 16  Temp:    SpO2: 100% 97%    Last Pain:  Vitals:   01/28/17 0947  TempSrc: Tympanic                 Martha Clan

## 2017-01-28 NOTE — Anesthesia Preprocedure Evaluation (Signed)
Anesthesia Evaluation  Patient identified by MRN, date of birth, ID band Patient awake    Reviewed: Allergy & Precautions, H&P , NPO status , Patient's Chart, lab work & pertinent test results, reviewed documented beta blocker date and time   History of Anesthesia Complications Negative for: history of anesthetic complications  Airway Mallampati: I  TM Distance: >3 FB Neck ROM: full    Dental  (+) Dental Advidsory Given, Caps, Teeth Intact, Missing   Pulmonary neg pulmonary ROS,           Cardiovascular Exercise Tolerance: Good hypertension, (-) angina(-) CAD, (-) Past MI, (-) Cardiac Stents and (-) CABG (-) dysrhythmias (-) Valvular Problems/Murmurs     Neuro/Psych Seizures -,  negative psych ROS   GI/Hepatic Neg liver ROS, GERD  ,  Endo/Other  diabetes, Well ControlledHypothyroidism   Renal/GU Renal disease  negative genitourinary   Musculoskeletal   Abdominal   Peds  Hematology negative hematology ROS (+)   Anesthesia Other Findings Past Medical History: No date: Anemia No date: Arthritis No date: Cancer ([place])     Comment:  RENAL CELL No date: Diabetes mellitus without complication (HCC) No date: GERD (gastroesophageal reflux disease) No date: Hyperlipidemia No date: Hypertension No date: Hypothyroidism No date: Psoriasis No date: Seizures ([place])     Comment:  LAST SEIZURE ON 02-08-09   Reproductive/Obstetrics negative OB ROS                             Anesthesia Physical Anesthesia Plan  ASA: III  Anesthesia Plan: General   Post-op Pain Management:    Induction: Intravenous  PONV Risk Score and Plan: 3 and Propofol infusion  Airway Management Planned: Natural Airway and Nasal Cannula  Additional Equipment:   Intra-op Plan:   Post-operative Plan:   Informed Consent: I have reviewed the patients History and Physical, chart, labs and discussed the procedure  including the risks, benefits and alternatives for the proposed anesthesia with the patient or authorized representative who has indicated his/her understanding and acceptance.   Dental Advisory Given  Plan Discussed with: Anesthesiologist, CRNA and Surgeon  Anesthesia Plan Comments:         Anesthesia Quick Evaluation

## 2017-01-28 NOTE — Anesthesia Post-op Follow-up Note (Signed)
Anesthesia QCDR form completed.        

## 2017-01-28 NOTE — H&P (Signed)
Outpatient short stay form Pre-procedure 01/28/2017 8:29 AM Vanessa Sails MD  Primary Physician: Dr. [name]  Reason for visit:  EGD and colonoscopy  History of present illness:  Patient is a 76 year old female presenting today as above. She has family history of colon cancer primary relative, father. Her last colonoscopy was about 5 years ago. Has been having some very mild dysphagia issues. A barium swallow indicated a possible narrowing at the GE junction. She does not regurgitate foods. She does take a proton pump inhibitor.  She takes no aspirin or blood thinning agents. She tolerated her prep well.    Current Facility-Administered Medications:  .  0.9 %  sodium chloride infusion, , Intravenous, Continuous, Vanessa Sails, MD, Last Rate: 20 mL/hr at 01/28/17 0747 .  0.9 %  sodium chloride infusion, , Intravenous, Continuous, Vanessa Sails, MD  Prescriptions Prior to Admission  Medication Sig Dispense Refill Last Dose  . ALLOPURINOL PO Take by mouth daily.   01/26/2017  . colchicine 0.6 MG tablet Take 0.6 mg by mouth 2 (two) times daily.   01/26/2017  . levETIRAcetam (KEPPRA) 500 MG tablet Take 500 mg by mouth 2 (two) times daily.   01/28/2017 at Unknown time  . levothyroxine (SYNTHROID, LEVOTHROID) 150 MCG tablet Take 150 mcg by mouth daily before breakfast.   01/28/2017 at Unknown time  . lisinopril-hydrochlorothiazide (PRINZIDE,ZESTORETIC) 20-25 MG tablet Take 1 tablet by mouth daily.   01/28/2017 at Unknown time  . methotrexate 2.5 MG tablet Take 10 mg by mouth once a week.   01/24/2017  . omeprazole (PRILOSEC) 20 MG capsule Take 20 mg by mouth every morning.   01/28/2017 at Unknown time  . calcium carbonate (OS-CAL) 600 MG TABS tablet Take 600 mg by mouth daily.   01/25/2017  . doxycycline (VIBRA-TABS) 100 MG tablet Take 1 tablet (100 mg total) by mouth 2 (two) times daily. (Patient not taking: Reported on 01/28/2017) 14 tablet 0 Completed Course at Unknown time  .  folic acid (FOLVITE) 1 MG tablet Take 1 mg by mouth daily.   01/21/2017  . glipiZIDE (GLUCOTROL) 5 MG tablet Take 5 mg by mouth daily before breakfast.   01/26/2017     Allergies  Allergen Reactions  . Statins Other (See Comments)    BLOATED FEELING, CHEST FULLNESS     Past Medical History:  Diagnosis Date  . Anemia   . Arthritis   . Cancer (HCC)    RENAL CELL  . Diabetes mellitus without complication ([place])   . GERD (gastroesophageal reflux disease)   . Hyperlipidemia   . Hypertension   . Hypothyroidism   . Psoriasis   . Seizures ([place])    LAST SEIZURE ON 02-08-09    Review of systems:      Physical Exam    Heart and lungs: Regular rate and rhythm without rub or gallop, lungs are bilaterally clear.    HEENT: Normocephalic atraumatic eyes are anicteric    Other:     Pertinant exam for procedure: Soft nontender nondistended bowel sounds positive normoactive.    Planned proceedures: EGD, colonoscopy and indicated procedures. I have discussed the risks benefits and complications of procedures to include not limited to bleeding, infection, perforation and the risk of sedation and the patient wishes to proceed.    Vanessa Sails, MD Gastroenterology 01/28/2017  8:29 AM

## 2017-01-28 NOTE — Transfer of Care (Signed)
Immediate Anesthesia Transfer of Care Note  Patient: Vanessa Esparza  Procedure(s) Performed: Procedure(s): COLONOSCOPY WITH PROPOFOL (N/A) ESOPHAGOGASTRODUODENOSCOPY (EGD) WITH PROPOFOL (N/A)  Patient Location: Endoscopy Unit  Anesthesia Type:General  Level of Consciousness: sedated  Airway & Oxygen Therapy: Patient Spontanous Breathing and Patient connected to nasal cannula oxygen  Post-op Assessment: Report given to RN and Post -op Vital signs reviewed and stable  Post vital signs: Reviewed and stable  Last Vitals:  Vitals:   01/28/17 0752  BP: (!) 151/73  Pulse: 74  Resp: 20  Temp: (!) 36 C  SpO2: 100%    Last Pain:  Vitals:   01/28/17 0752  TempSrc: Tympanic         Complications: No apparent anesthesia complications

## 2017-01-28 NOTE — Op Note (Signed)
[facility] Gastroenterology Patient Name: Vanessa Esparza Procedure Date: 01/28/2017 8:32 AM MRN: [id number] Account #: [id number] Date of Birth: January 15, 1941 Admit Type: Outpatient Age: 76 Room: [facility] ENDO ROOM 3 Gender: Female Note Status: Finalized Procedure:            Upper GI endoscopy Indications:          Dysphagia Providers:            Lollie Sails, MD Referring MD:         Irven Easterly. Kary Kos, MD (Referring MD) Medicines:            Monitored Anesthesia Care Complications:        No immediate complications. Procedure:            Pre-Anesthesia Assessment:                       - ASA Grade Assessment: III - A patient with severe                        systemic disease.                       After obtaining informed consent, the endoscope was                        passed under direct vision. Throughout the procedure,                        the patient's blood pressure, pulse, and oxygen                        saturations were monitored continuously. The Endoscope                        was introduced through the mouth, and advanced to the                        third part of duodenum. The upper GI endoscopy was                        accomplished without difficulty. The patient tolerated                        the procedure well. Findings:      The middle third of the esophagus and lower third of the esophagus were       moderately tortuous.      A small hiatal hernia was present.      The Z-line was variable. Biopsies were taken with a cold forceps for       histology.      Patchy mild inflammation characterized by congestion (edema), erythema       and linear erosions was found in the gastric body.      Patchy moderate inflammation characterized by erosions, erythema and       granularity was found at the incisura. Biopsies were taken with a cold       forceps for histology.      The cardia and gastric fundus were normal on retroflexion otherwise.      The examined duodenum was normal. Impression:           - Tortuous esophagus.                       -  Small hiatal hernia.                       - Z-line variable. Biopsied.                       - Erosive gastritis.                       - Erosive gastritis. Biopsied.                       - Normal examined duodenum. Recommendation:       - Use Protonix (pantoprazole) 40 mg PO daily daily.                       - Await pathology results.                       - Return to GI clinic in 6 weeks. Procedure Code(s):    --- Professional ---                       [phone], Esophagogastroduodenoscopy, flexible, transoral;                        with biopsy, single or multiple Diagnosis Code(s):    --- Professional ---                       Q39.9, Congenital malformation of esophagus, unspecified                       K44.9, Diaphragmatic hernia without obstruction or                        gangrene                       K22.8, Other specified diseases of esophagus                       K29.60, Other gastritis without bleeding                       R13.10, Dysphagia, unspecified CPT copyright 2016 American Medical Association. All rights reserved. The codes documented in this report are preliminary and upon coder review may  be revised to meet current compliance requirements. Lollie Sails, MD 01/28/2017 9:09:59 AM This report has been signed electronically. Number of Addenda: 0 Note Initiated On: 01/28/2017 8:32 AM      [facility]

## 2017-01-28 NOTE — Op Note (Signed)
[facility] Gastroenterology Patient Name: Vanessa Esparza Procedure Date: 01/28/2017 8:32 AM MRN: [id number] Account #: [id number] Date of Birth: 1940-08-19 Admit Type: Outpatient Age: 76 Room: [facility] ENDO ROOM 3 Gender: Female Note Status: Finalized Procedure:            Colonoscopy Indications:          Family history of colon cancer in a first-degree                        relative Providers:            Lollie Sails, MD Referring MD:         Irven Easterly. Kary Kos, MD (Referring MD) Medicines:            Monitored Anesthesia Care Complications:        No immediate complications. Procedure:            Pre-Anesthesia Assessment:                       - ASA Grade Assessment: III - A patient with severe                        systemic disease.                       After obtaining informed consent, the colonoscope was                        passed under direct vision. Throughout the procedure,                        the patient's blood pressure, pulse, and oxygen                        saturations were monitored continuously. The                        Colonoscope was introduced through the anus and                        advanced to the the cecum, identified by appendiceal                        orifice and ileocecal valve. The colonoscopy was                        performed with moderate difficulty. Successful                        completion of the procedure was aided by lavage. The                        quality of the bowel preparation was good except the                        cecum was poor. Findings:      A 5 mm polyp was found in the splenic flexure. The polyp was sessile.       The polyp was removed with a cold snare. Resection and retrieval were       complete.      A 2  mm polyp was found in the distal sigmoid colon. The polyp was       sessile. The polyp was removed with a cold biopsy forceps. Resection and       retrieval were complete.  Multiple small-mouthed diverticula were found in the sigmoid colon,       descending colon and transverse colon.      Non-bleeding internal hemorrhoids were found during retroflexion and       during anoscopy. The hemorrhoids were small and Grade I (internal       hemorrhoids that do not prolapse). Impression:           - One 5 mm polyp at the splenic flexure, removed with a                        cold snare. Resected and retrieved.                       - One 2 mm polyp in the distal sigmoid colon, removed                        with a cold biopsy forceps. Resected and retrieved.                       - Diverticulosis in the sigmoid colon, in the                        descending colon and in the transverse colon.                       - Non-bleeding internal hemorrhoids. Recommendation:       - Await pathology results.                       - Telephone GI clinic for pathology results in 1 week. Procedure Code(s):    --- Professional ---                       [phone], Colonoscopy, flexible; with removal of tumor(s),                        polyp(s), or other lesion(s) by snare technique                       45380, 34, Colonoscopy, flexible; with biopsy, single                        or multiple Diagnosis Code(s):    --- Professional ---                       D12.3, Benign neoplasm of transverse colon (hepatic                        flexure or splenic flexure)                       D12.5, Benign neoplasm of sigmoid colon                       K64.0, First degree hemorrhoids  Z80.0, Family history of malignant neoplasm of                        digestive organs                       K57.30, Diverticulosis of large intestine without                        perforation or abscess without bleeding CPT copyright 2016 American Medical Association. All rights reserved. The codes documented in this report are preliminary and upon coder review may  be revised to meet current  compliance requirements. Lollie Sails, MD 01/28/2017 9:46:03 AM This report has been signed electronically. Number of Addenda: 0 Note Initiated On: 01/28/2017 8:32 AM Scope Withdrawal Time: 0 hours 16 minutes 4 seconds  Total Procedure Duration: 0 hours 28 minutes 7 seconds       [facility]

## 2017-01-29 ENCOUNTER — Encounter: Payer: Self-pay | Admitting: Gastroenterology

## 2017-01-30 LAB — SURGICAL PATHOLOGY

## 2017-02-09 ENCOUNTER — Emergency Department
Admission: EM | Admit: 2017-02-09 | Discharge: 2017-02-09 | Disposition: A | Discharge status: Home / Self Care | Payer: Medicare Other | Attending: Emergency Medicine | Admitting: Emergency Medicine

## 2017-02-09 ENCOUNTER — Encounter: Payer: Self-pay | Admitting: Emergency Medicine

## 2017-02-09 ENCOUNTER — Emergency Department: Payer: Medicare Other

## 2017-02-09 DIAGNOSIS — E039 Hypothyroidism, unspecified: Secondary | ICD-10-CM | POA: Diagnosis not present

## 2017-02-09 DIAGNOSIS — R509 Fever, unspecified: Secondary | ICD-10-CM

## 2017-02-09 DIAGNOSIS — I10 Essential (primary) hypertension: Secondary | ICD-10-CM | POA: Insufficient documentation

## 2017-02-09 DIAGNOSIS — Z79899 Other long term (current) drug therapy: Secondary | ICD-10-CM | POA: Diagnosis not present

## 2017-02-09 DIAGNOSIS — E119 Type 2 diabetes mellitus without complications: Secondary | ICD-10-CM | POA: Insufficient documentation

## 2017-02-09 DIAGNOSIS — M791 Myalgia: Secondary | ICD-10-CM | POA: Diagnosis present

## 2017-02-09 DIAGNOSIS — Z85528 Personal history of other malignant neoplasm of kidney: Secondary | ICD-10-CM | POA: Diagnosis not present

## 2017-02-09 LAB — URINALYSIS, COMPLETE (UACMP) WITH MICROSCOPIC
BACTERIA UA: NONE SEEN
Bilirubin Urine: NEGATIVE
Glucose, UA: NEGATIVE mg/dL
KETONES UR: 5 mg/dL — AB
Nitrite: NEGATIVE
PROTEIN: 100 mg/dL — AB
Specific Gravity, Urine: 1.025 (ref 1.005–1.030)
pH: 5 (ref 5.0–8.0)

## 2017-02-09 LAB — CBC WITH DIFFERENTIAL/PLATELET
Basophils Absolute: 0 10*3/uL (ref 0–0.1)
Basophils Relative: 1 %
EOS PCT: 0 %
Eosinophils Absolute: 0 10*3/uL (ref 0–0.7)
HEMATOCRIT: 35.4 % (ref 35.0–47.0)
Hemoglobin: 12.4 g/dL (ref 12.0–16.0)
LYMPHS PCT: 11 %
Lymphs Abs: 0.7 10*3/uL — ABNORMAL LOW (ref 1.0–3.6)
MCH: 26.8 pg (ref 26.0–34.0)
MCHC: 34.9 g/dL (ref 32.0–36.0)
MCV: 76.8 fL — AB (ref 80.0–100.0)
MONO ABS: 0.4 10*3/uL (ref 0.2–0.9)
MONOS PCT: 6 %
NEUTROS ABS: 5.4 10*3/uL (ref 1.4–6.5)
Neutrophils Relative %: 82 %
PLATELETS: 376 10*3/uL (ref 150–440)
RBC: 4.62 MIL/uL (ref 3.80–5.20)
RDW: 15.5 % — AB (ref 11.5–14.5)
WBC: 6.6 10*3/uL (ref 3.6–11.0)

## 2017-02-09 LAB — BASIC METABOLIC PANEL
ANION GAP: 13 (ref 5–15)
BUN: 26 mg/dL — AB (ref 6–20)
CALCIUM: 8.8 mg/dL — AB (ref 8.9–10.3)
CO2: 28 mmol/L (ref 22–32)
Chloride: 91 mmol/L — ABNORMAL LOW (ref 101–111)
Creatinine, Ser: 1.39 mg/dL — ABNORMAL HIGH (ref 0.44–1.00)
GFR calc Af Amer: 42 mL/min — ABNORMAL LOW (ref >60)
GFR calc non Af Amer: 36 mL/min — ABNORMAL LOW (ref >60)
GLUCOSE: 175 mg/dL — AB (ref 65–99)
POTASSIUM: 3.2 mmol/L — AB (ref 3.5–5.1)
Sodium: 132 mmol/L — ABNORMAL LOW (ref 135–145)

## 2017-02-09 LAB — INFLUENZA PANEL BY PCR (TYPE A & B)
INFLAPCR: NEGATIVE
Influenza B By PCR: NEGATIVE

## 2017-02-09 MED ORDER — ACETAMINOPHEN 325 MG PO TABS
650.0000 mg | ORAL_TABLET | Freq: Once | ORAL | Status: AC
  Administered 2017-02-09: 650 mg via ORAL
  Filled 2017-02-09: qty 2

## 2017-02-09 MED ORDER — SODIUM CHLORIDE 0.9 % IV BOLUS (SEPSIS)
500.0000 mL | Freq: Once | INTRAVENOUS | Status: AC
  Administered 2017-02-09: 500 mL via INTRAVENOUS

## 2017-02-09 NOTE — ED Notes (Signed)
Pt states feels much better.

## 2017-02-09 NOTE — ED Provider Notes (Signed)
[facility] Emergency Department Provider Note ____________________________________________   First MD Initiated Contact with Patient 02/09/17 1558     (approximate)  I have reviewed the triage vital signs and the nursing notes.   HISTORY  Chief Complaint Generalized Body Aches    HPI Vanessa Esparza is a 76 y.o. female With past medical history as noted who presents with body aches for approximately one week, gradual onset, generalized to her whole body and her joints, associated with low-grade fevers of about 99 over the same period, and also associated with mild frontal headache. Patient reports some urinary frequency but denies any other focal symptoms. She denies cough or shortness of breath, and has no GI symptoms. Patient saw her primary care doctor this week who thought that it may be a viral syndrome, but she is concerned because her symptoms are not improving.  Past Medical History:  Diagnosis Date  . Anemia   . Arthritis   . Cancer (HCC)    RENAL CELL  . Diabetes mellitus without complication ([place])   . GERD (gastroesophageal reflux disease)   . Hyperlipidemia   . Hypertension   . Hypothyroidism   . Psoriasis   . Seizures ([place])    LAST SEIZURE ON 02-08-09    Patient Active Problem List   Diagnosis Date Noted  . Anemia 08/16/2016  . Diabetes mellitus type 2, uncomplicated ([place]) 40/98/1191  . Hyperlipidemia, unspecified 08/16/2016  . Hypertension 08/16/2016  . Thyroid disease 08/16/2016  . Acquired cyst of kidney 04/17/2012  . Mixed urge and stress incontinence 04/17/2012  . Renal colic 47/82/9562  . Malignant neoplasm of kidney ([place]) 03/28/2011  . 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 Esophageal reflux 07/04/2008  . Hypercholesterolemia 07/04/2008  . Hypertension, benign 07/04/2008  . Hypothyroidism (acquired) 07/04/2008    Past Surgical History:  Procedure Laterality Date  . ABDOMINAL HYSTERECTOMY    . BACK SURGERY       LUMBAR  . BRAIN MENINGIOMA EXCISION    . BRAIN SURGERY     CRANIOTOMY WITH EXCISION OF SUPRATENTORIAL MENINGIOMA  . COLONOSCOPY    . COLONOSCOPY WITH PROPOFOL N/A 01/28/2017   Procedure: COLONOSCOPY WITH PROPOFOL;  Surgeon: Lollie Sails, MD;  Location[facility] ENDOSCOPY;  Service: Endoscopy;  Laterality: N/A;  . ESOPHAGOGASTRODUODENOSCOPY    . ESOPHAGOGASTRODUODENOSCOPY (EGD) WITH PROPOFOL N/A 01/28/2017   Procedure: ESOPHAGOGASTRODUODENOSCOPY (EGD) WITH PROPOFOL;  Surgeon: Lollie Sails, MD;  Location: [facility] ENDOSCOPY;  Service: Endoscopy;  Laterality: N/A;  . HEMORRHOID SURGERY    . KNEE ARTHROSCOPY Right 09/21/2015   Procedure: ARTHROSCOPY KNEE, LATERAL AND MEDIAL MENISECTOMY;  Surgeon: Hessie Knows, MD;  Location: [facility] ORS;  Service: Orthopedics;  Laterality: Right;  . PARTIAL NEPHRECTOMY Left    DUE TO RENAL CELL CARCINOMA  . REDUCTION MAMMAPLASTY Bilateral   . THYROID SURGERY      Prior to Admission medications   Medication Sig Start Date End Date Taking? Authorizing Provider  allopurinol (ZYLOPRIM) 100 MG tablet Take by mouth daily.   Yes [provider]  calcium carbonate (OS-CAL) 600 MG TABS tablet Take 600 mg by mouth daily.   Yes [provider]  colchicine 0.6 MG tablet Take 0.6 mg by mouth 2 (two) times daily.   Yes [provider]  folic acid (FOLVITE) 1 MG tablet Take 1 mg by mouth daily.   Yes [provider]  glipiZIDE (GLUCOTROL) 5 MG tablet Take 5 mg by mouth daily before breakfast.   Yes  [provider]  levETIRAcetam (KEPPRA) 500 MG tablet Take 500 mg by mouth 2 (two) times daily.   Yes [provider]  levothyroxine (SYNTHROID, LEVOTHROID) 150 MCG tablet Take 150 mcg by mouth daily before breakfast.   Yes [provider]  lisinopril-hydrochlorothiazide (PRINZIDE,ZESTORETIC) 20-25 MG tablet Take 1 tablet by mouth daily.   Yes [provider]  methotrexate 2.5 MG tablet Take 10 mg by mouth  once a week.   Yes [provider]  omeprazole (PRILOSEC) 20 MG capsule Take 20 mg by mouth every morning.   Yes [provider]  doxycycline (VIBRA-TABS) 100 MG tablet Take 1 tablet (100 mg total) by mouth 2 (two) times daily. Patient not taking: Reported on 02/09/2017 09/10/16   Edrick Kins, DPM    Allergies Statins  Family History  Problem Relation Age of Onset  . Breast cancer Other   . Breast cancer Mother     Social History Social History  Substance Use Topics  . Smoking status: Never Smoker  . Smokeless tobacco: Never Used  . Alcohol use No    Review of Systems  Constitutional: Positive for low-grade fever Eyes: No redness. ENT: No sore throat. Cardiovascular: Denies chest pain. Respiratory: Denies shortness of breath or cough. Gastrointestinal: No nausea, no vomiting.  No diarrhea.  Genitourinary: Positive for urinary frequency. Musculoskeletal: Positive for myalgias. Skin: Negative for rash. Neurological: Positive for headache.   ____________________________________________   PHYSICAL EXAM:  VITAL SIGNS: ED Triage Vitals  Enc Vitals Group     BP 02/09/17 1550 (!) 132/51     Pulse Rate 02/09/17 1548 (!) 110     Resp 02/09/17 1548 16     Temp 02/09/17 1548 99.3 F (37.4 C)     Temp Source 02/09/17 1548 Oral     SpO2 02/09/17 1548 93 %     Weight --      Height --      Head Circumference --      Peak Flow --      Pain Score --      Pain Loc --      Pain Edu? --      Excl. in [place]? --     Constitutional: Alert and oriented. Well appearing and in no acute distress. Eyes: Conjunctivae are normal.   Head: Atraumatic.  Nose: No congestion/rhinnorhea. Mouth/Throat: Mucous membranes are slightly dry.  Neck: Normal range of motion. No meningeal signs.  Cardiovascular: Normal rate, regular rhythm. Grossly normal heart sounds.  Good peripheral circulation. Respiratory: Normal respiratory effort.  No retractions. Lungs  CTAB. Gastrointestinal: Soft and nontender. No distention.  Genitourinary: No CVA tenderness. Musculoskeletal: No lower extremity edema.  Extremities warm and well perfused.  Neurologic:  Normal speech and language. No gross focal neurologic deficits are appreciated.  Skin:  Skin is warm and dry. No rash noted. Psychiatric: Mood and affect are normal. Speech and behavior are normal.  ____________________________________________   LABS (all labs ordered are listed, but only abnormal results are displayed)  Labs Reviewed  BASIC METABOLIC PANEL - Abnormal; Notable for the following:       Result Value   Sodium 132 (*)    Potassium 3.2 (*)    Chloride 91 (*)    Glucose, Bld 175 (*)    BUN 26 (*)    Creatinine, Ser 1.39 (*)    Calcium 8.8 (*)    GFR calc non Af Amer 36 (*)    GFR calc Af Amer 42 (*)  All other components within normal limits  CBC WITH DIFFERENTIAL/PLATELET - Abnormal; Notable for the following:    MCV 76.8 (*)    RDW 15.5 (*)    Lymphs Abs 0.7 (*)    All other components within normal limits  URINALYSIS, COMPLETE (UACMP) WITH MICROSCOPIC - Abnormal; Notable for the following:    Color, Urine YELLOW (*)    APPearance HAZY (*)    Hgb urine dipstick SMALL (*)    Ketones, ur 5 (*)    Protein, ur 100 (*)    Leukocytes, UA TRACE (*)    Squamous Epithelial / LPF 0-5 (*)    All other components within normal limits  INFLUENZA PANEL BY PCR (TYPE A & B)   ____________________________________________  EKG   ____________________________________________  RADIOLOGY    ____________________________________________   PROCEDURES  Procedure(s) performed: No    Critical Care performed: No ____________________________________________   INITIAL IMPRESSION / ASSESSMENT AND PLAN / ED COURSE  Pertinent labs & imaging results that were available during my care of the patient were reviewed by me and considered in my medical decision making (see chart for  details).  76 year old female with diabetes, hypertension, and other past medical history as noted presents with one week of generalized body aches with low-grade fevers at home. Patient reports mild frontal headache, and some urinary frequency and dysuria but denies other focal symptoms. On exam, patient has borderline temperature and heart rate, but other vital signs are normal. She is relatively well-appearing. Exam is otherwise unremarkable.  differential includes viral syndrome, flu, UTI, less likely pneumonia.  She has no meningeal signs, and generalized symptoms, with no clinical evidence of meningitis.  Plan: basic labs, CXR, UA, fluids, APAP and reassess.  Clinical Course as of Feb 09 1938  Sun Feb 09, 2017  1819 GFR, Est African American: (!) 42 [SS]    Clinical Course User Index [SS] Arta Silence, MD   ----------------------------------------- 7:38 PM on 02/09/2017 -----------------------------------------  Patient's chest x-ray and UA are negative. No elevated WBC. Influenza also negative.BMP reveals slightly low sodium and potassium, although these are only minimally changed from patient's baseline. Creatinine is also slightly elevated. No indication for acute intervention.  At this time given patient's well appearance, negative infectious workup, and normal vital signs, she is safe for discharge home. Overall still suspect most likely viral syndrome. Patient given thorough return precautions, and I answered all of her questions. She agrees to follow-up with her primary care doctor this week, and will take Tylenol for fever and aches.  ____________________________________________   FINAL CLINICAL IMPRESSION(S) / ED DIAGNOSES  Final diagnoses:  Febrile illness      NEW MEDICATIONS STARTED DURING THIS VISIT:  New Prescriptions   No medications on file     Note:  This document was prepared using Dragon voice recognition software and may include unintentional  dictation errors.    Arta Silence, MD 02/09/17 [phone]

## 2017-02-09 NOTE — ED Triage Notes (Signed)
Pt states that she has been having chills and body aches for the past week. Was seen by her PCP and told that she has a virus but she is not feeling any better. Pt tachycardic in triage at 110.

## 2017-02-09 NOTE — ED Notes (Signed)
Pt brought back to room 25 for generalized weakness and chills x 1 week with stable vss. States no one else in the home has been ill. Saw pcp on weds and states "they found nothing wrong".

## 2017-02-09 NOTE — Discharge Instructions (Signed)
Return to the ER for new or worsening fevers, high fevers, weakness, difficulty breathing, urinary symptoms, vomiting, confusion or change in mental status, worsening headache or neck stiffness, or any other new or worsening symptoms that concern you.  Follow up with your primary care doctor.

## 2017-09-23 ENCOUNTER — Other Ambulatory Visit: Payer: Self-pay | Admitting: Family Medicine

## 2017-09-23 DIAGNOSIS — Z1231 Encounter for screening mammogram for malignant neoplasm of breast: Secondary | ICD-10-CM

## 2017-10-22 ENCOUNTER — Ambulatory Visit
Admission: RE | Admit: 2017-10-22 | Discharge: 2017-10-22 | Disposition: A | Discharge status: Home / Self Care | Payer: Medicare Other | Source: Ambulatory Visit | Attending: Family Medicine | Admitting: Family Medicine

## 2017-10-22 DIAGNOSIS — Z1231 Encounter for screening mammogram for malignant neoplasm of breast: Secondary | ICD-10-CM | POA: Insufficient documentation

## 2018-03-26 ENCOUNTER — Encounter: Payer: Self-pay | Admitting: Urology

## 2018-03-26 ENCOUNTER — Ambulatory Visit: Payer: Medicare Other | Admitting: Urology

## 2018-03-26 ENCOUNTER — Other Ambulatory Visit: Payer: Self-pay

## 2018-03-26 VITALS — BP 128/79 | HR 79 | Ht 70.0 in | Wt 198.0 lb

## 2018-03-26 DIAGNOSIS — Z85528 Personal history of other malignant neoplasm of kidney: Secondary | ICD-10-CM

## 2018-03-26 NOTE — Progress Notes (Signed)
03/26/2018 8:49 PM   Vanessa Esparza May 01, 1941 [id number]  Referring provider: [name], MD [address] [facility] Vanessa Esparza [id number]  Chief Complaint  Patient presents with  . Establish Care    history of left kidney cancer 2013   Urologic history: 1. 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  She last saw Dr. Jacqlyn Larsen in August 2018.  Renal ultrasound at that time showed no evidence of recurrent disease.  She presently has no complaints.  She denies flank/abdominal or pelvic pain.  She denies weight loss, fever, chills, night sweats.  Creatinine April 2019 was normal at 1.0.  Comprehensive metabolic panel March 8938 showed no significant abnormalities.  PMH: Past Medical History:  Diagnosis Date  . Anemia   . Arthritis   . Cancer (HCC)    RENAL CELL  . Diabetes mellitus without complication ([place])   . GERD (gastroesophageal reflux disease)   . Hyperlipidemia   . Hypertension   . Hypothyroidism   . Psoriasis   . Seizures ([place])    LAST SEIZURE ON 02-08-09    Surgical History: Past Surgical History:  Procedure Laterality Date  . ABDOMINAL HYSTERECTOMY    . BACK SURGERY     LUMBAR  . BRAIN MENINGIOMA EXCISION    . BRAIN SURGERY     CRANIOTOMY WITH EXCISION OF SUPRATENTORIAL MENINGIOMA  . COLONOSCOPY    . COLONOSCOPY WITH PROPOFOL N/A 01/28/2017   Procedure: COLONOSCOPY WITH PROPOFOL;  Surgeon: Lollie Sails, MD;  Location: [facility] ENDOSCOPY;  Service: Endoscopy;  Laterality: N/A;  . ESOPHAGOGASTRODUODENOSCOPY    . ESOPHAGOGASTRODUODENOSCOPY (EGD) WITH PROPOFOL N/A 01/28/2017   Procedure: ESOPHAGOGASTRODUODENOSCOPY (EGD) WITH PROPOFOL;  Surgeon: Lollie Sails, MD;  Location: [facility] ENDOSCOPY;  Service: Endoscopy;  Laterality: N/A;  . HEMORRHOID SURGERY    . KNEE ARTHROSCOPY Right 09/21/2015   Procedure:  ARTHROSCOPY KNEE, LATERAL AND MEDIAL MENISECTOMY;  Surgeon: Hessie Knows, MD;  Location: [facility] ORS;  Service: Orthopedics;  Laterality: Right;  . PARTIAL NEPHRECTOMY Left    DUE TO RENAL CELL CARCINOMA  . REDUCTION MAMMAPLASTY Bilateral   . THYROID SURGERY      Home Medications:  Allergies as of 03/26/2018      Reactions   Methotrexate Derivatives Swelling   Tongue swelling   Statins Other (See Comments)   BLOATED FEELING, CHEST FULLNESS      Medication List        Accurate as of 03/26/18  8:49 PM. Always use your most recent med list.          allopurinol 100 MG tablet Commonly known as:  ZYLOPRIM Take by mouth daily.   calcium carbonate 600 MG Tabs tablet Commonly known as:  OS-CAL Take 600 mg by mouth daily.   colchicine 0.6 MG tablet Take 0.6 mg by mouth 2 (two) times daily.   folic acid 1 MG tablet Commonly known as:  FOLVITE Take 1 mg by mouth daily.   glipiZIDE 5 MG tablet Commonly known as:  GLUCOTROL Take 5 mg by mouth daily before breakfast.   glucose blood test strip USE 1 STRIP TO CHECK GLUCOSE TWICE DAILY   levETIRAcetam 500 MG tablet Commonly known as:  KEPPRA Take 500 mg by mouth 2 (two) times daily.   levothyroxine 150 MCG tablet Commonly known as:  SYNTHROID, LEVOTHROID Take 150 mcg by mouth daily before breakfast.  lisinopril-hydrochlorothiazide 20-25 MG tablet Commonly known as:  PRINZIDE,ZESTORETIC Take 1 tablet by mouth daily.   omeprazole 20 MG capsule Commonly known as:  PRILOSEC Take 20 mg by mouth every morning.       Allergies:  Allergies  Allergen Reactions  . Methotrexate Derivatives Swelling    Tongue swelling  . Statins Other (See Comments)    BLOATED FEELING, CHEST FULLNESS    Family History: Family History  Problem Relation Age of Onset  . Breast cancer Other   . Breast cancer Mother     Social History:  reports that she has never smoked. She has never used smokeless tobacco. She reports that she does not  drink alcohol or use drugs.  ROS: UROLOGY Frequent Urination?: No Hard to postpone urination?: No Burning/pain with urination?: No Get up at night to urinate?: No Leakage of urine?: No Urine stream starts and stops?: No Trouble starting stream?: No Do you have to strain to urinate?: No Blood in urine?: No Urinary tract infection?: No Sexually transmitted disease?: No Injury to kidneys or bladder?: No Painful intercourse?: No Weak stream?: No Currently pregnant?: No Vaginal bleeding?: No Last menstrual period?: n  Gastrointestinal Nausea?: No Vomiting?: No Indigestion/heartburn?: No Diarrhea?: No Constipation?: No  Constitutional Fever: No Night sweats?: No Weight loss?: No Fatigue?: No  Skin Skin rash/lesions?: No Itching?: No  Eyes Blurred vision?: No Double vision?: No  Ears/Nose/Throat Sore throat?: No Sinus problems?: No  Hematologic/Lymphatic Swollen glands?: No Easy bruising?: No  Cardiovascular Leg swelling?: No Chest pain?: No  Respiratory Cough?: No Shortness of breath?: No  Endocrine Excessive thirst?: No  Musculoskeletal Back pain?: No Joint pain?: No  Neurological Headaches?: No Dizziness?: No  Psychologic Depression?: No Anxiety?: No  Physical Exam: BP 128/79   Pulse 79   Ht 5\' 10"  (1.778 m)   Wt 198 lb (89.8 kg)   BMI 28.41 kg/m   Constitutional:  Alert and oriented, No acute distress. HEENT: Gray AT, moist mucus membranes.  Trachea midline, no masses. Cardiovascular: No clubbing, cyanosis, or edema. Respiratory: Normal respiratory effort, no increased work of breathing. GI: Abdomen is soft, nontender, nondistended, no abdominal masses GU: No CVA tenderness Lymph: No cervical or inguinal lymphadenopathy. Skin: No rashes, bruises or suspicious lesions. Neurologic: Grossly intact, no focal deficits, moving all 4 extremities. Psychiatric: Normal mood and affect.   Assessment & Plan:   77 year old female with a  history of pT1a renal cell carcinoma.  She is almost 7 years out from her surgery.  Routine follow-up abdominal imaging and chest x-ray no longer required.  Would recommend continuing annual follow-up.  For peace of mind she would like to have a periodic renal ultrasound and will have her follow-up in 1 year with a renal ultrasound prior to that visit.  Abbie Sons, [place] [address] [place], [place] [address] [phone]

## 2018-05-29 ENCOUNTER — Other Ambulatory Visit: Payer: Self-pay | Admitting: Orthopedic Surgery

## 2018-05-29 DIAGNOSIS — G8929 Other chronic pain: Secondary | ICD-10-CM

## 2018-05-29 DIAGNOSIS — M25561 Pain in right knee: Principal | ICD-10-CM

## 2018-06-05 ENCOUNTER — Ambulatory Visit: Payer: Medicare Other

## 2018-06-17 ENCOUNTER — Other Ambulatory Visit: Payer: Self-pay

## 2018-06-17 ENCOUNTER — Encounter
Admission: RE | Admit: 2018-06-17 | Discharge: 2018-06-17 | Disposition: A | Discharge status: Home / Self Care | Payer: Medicare Other | Source: Ambulatory Visit | Attending: Orthopedic Surgery | Admitting: Orthopedic Surgery

## 2018-06-17 DIAGNOSIS — R9431 Abnormal electrocardiogram [ECG] [EKG]: Secondary | ICD-10-CM | POA: Insufficient documentation

## 2018-06-17 DIAGNOSIS — I444 Left anterior fascicular block: Secondary | ICD-10-CM | POA: Insufficient documentation

## 2018-06-17 DIAGNOSIS — I517 Cardiomegaly: Secondary | ICD-10-CM | POA: Insufficient documentation

## 2018-06-17 DIAGNOSIS — I10 Essential (primary) hypertension: Secondary | ICD-10-CM | POA: Diagnosis not present

## 2018-06-17 DIAGNOSIS — Z01818 Encounter for other preprocedural examination: Secondary | ICD-10-CM | POA: Diagnosis present

## 2018-06-17 HISTORY — DX: Unspecified convulsions: R56.9

## 2018-06-17 HISTORY — DX: Benign neoplasm of meninges, unspecified: D32.9

## 2018-06-17 HISTORY — DX: Gout, unspecified: M10.9

## 2018-06-17 LAB — CBC
HCT: 35 % — ABNORMAL LOW (ref 36.0–46.0)
Hemoglobin: 11.1 g/dL — ABNORMAL LOW (ref 12.0–15.0)
MCH: 24.8 pg — ABNORMAL LOW (ref 26.0–34.0)
MCHC: 31.7 g/dL (ref 30.0–36.0)
MCV: 78.3 fL — ABNORMAL LOW (ref 80.0–100.0)
NRBC: 0 % (ref 0.0–0.2)
PLATELETS: 367 10*3/uL (ref 150–400)
RBC: 4.47 MIL/uL (ref 3.87–5.11)
RDW: 13.8 % (ref 11.5–15.5)
WBC: 6 10*3/uL (ref 4.0–10.5)

## 2018-06-17 LAB — URINALYSIS, COMPLETE (UACMP) WITH MICROSCOPIC
Bacteria, UA: NONE SEEN
Bilirubin Urine: NEGATIVE
GLUCOSE, UA: NEGATIVE mg/dL
Hgb urine dipstick: NEGATIVE
Ketones, ur: NEGATIVE mg/dL
Leukocytes, UA: NEGATIVE
Nitrite: NEGATIVE
Protein, ur: NEGATIVE mg/dL
Specific Gravity, Urine: 1.014 (ref 1.005–1.030)
pH: 5 (ref 5.0–8.0)

## 2018-06-17 LAB — BASIC METABOLIC PANEL
ANION GAP: 7 (ref 5–15)
BUN: 19 mg/dL (ref 8–23)
CALCIUM: 8.4 mg/dL — AB (ref 8.9–10.3)
CO2: 32 mmol/L (ref 22–32)
CREATININE: 0.92 mg/dL (ref 0.44–1.00)
Chloride: 98 mmol/L (ref 98–111)
GLUCOSE: 115 mg/dL — AB (ref 70–99)
Potassium: 3.6 mmol/L (ref 3.5–5.1)
Sodium: 137 mmol/L (ref 135–145)

## 2018-06-17 LAB — SEDIMENTATION RATE: SED RATE: 34 mm/h — AB (ref 0–30)

## 2018-06-17 LAB — PROTIME-INR
INR: 1.02
Prothrombin Time: 13.3 seconds (ref 11.4–15.2)

## 2018-06-17 LAB — SURGICAL PCR SCREEN
MRSA, PCR: NEGATIVE
Staphylococcus aureus: POSITIVE — AB

## 2018-06-17 LAB — TYPE AND SCREEN
ABO/RH(D): O POS
Antibody Screen: NEGATIVE

## 2018-06-17 LAB — APTT: aPTT: 35 seconds (ref 24–36)

## 2018-06-17 NOTE — Patient Instructions (Signed)
Your procedure is scheduled on: 06/30/18 Tues Report to Same Day Surgery 2nd floor medical mall [facility] Entrance-take elevator on left to 2nd floor.  Check in with surgery information desk.) To find out your arrival time please call [phone] between 1PM - 3PM on 06/29/18 Mon  Remember: Instructions that are not followed completely may result in serious medical risk, up to and including death, or upon the discretion of your surgeon and anesthesiologist your surgery may need to be rescheduled.    _x___ 1. Do not eat food after midnight the night before your procedure. You may drink clear liquids up to 2 hours before you are scheduled to arrive at the hospital for your procedure.  Do not drink clear liquids within 2 hours of your scheduled arrival to the hospital.  Clear liquids include  --Water or Apple juice without pulp  --Clear carbohydrate beverage such as ClearFast or Gatorade  --Black Coffee or Clear Tea (No milk, no creamers, do not add anything to                  the coffee or Tea Type 1 and type 2 diabetics should only drink water.   ____Ensure clear carbohydrate drink on the way to the hospital for bariatric patients  ____Ensure clear carbohydrate drink 3 hours before surgery for Dr Dwyane Luo patients if physician instructed.   No gum chewing or hard candies.     __x__ 2. No Alcohol for 24 hours before or after surgery.   __x__3. No Smoking or e-cigarettes for 24 prior to surgery.  Do not use any chewable tobacco products for at least 6 hour prior to surgery   ____  4. Bring all medications with you on the day of surgery if instructed.    __x__ 5. Notify your doctor if there is any change in your medical condition     (cold, fever, infections).    x___6. On the morning of surgery brush your teeth with toothpaste and water.  You may rinse your mouth with mouth wash if you wish.  Do not swallow any toothpaste or mouthwash.   Do not wear jewelry, make-up, hairpins,  clips or nail polish.  Do not wear lotions, powders, or perfumes. You may wear deodorant.  Do not shave 48 hours prior to surgery. Men may shave face and neck.  Do not bring valuables to the hospital.    [facility] is not responsible for any belongings or valuables.               Contacts, dentures or bridgework may not be worn into surgery.  Leave your suitcase in the car. After surgery it may be brought to your room.  For patients admitted to the hospital, discharge time is determined by your                       treatment team.  _  Patients discharged the day of surgery will not be allowed to drive home.  You will need someone to drive you home and stay with you the night of your procedure.    Please read over the following fact sheets that you were given:   [facility] Preparing for Surgery and or MRSA Information   _x___ Take anti-hypertensive listed below, cardiac, seizure, asthma,     anti-reflux and psychiatric medicines. These include:  1. allopurinol (ZYLOPRIM) 100 MG tablet  2.ALLEGRA) 180 MG tablet  3.levETIRAcetam (KEPPRA) 500 MG tablet  4.levothyroxine (SYNTHROID,  LEVOTHROID) 150 MCG tablet  5.omeprazole (PRILOSEC) 40 MG capsule  6.  ____Fleets enema or Magnesium Citrate as directed.   _x___ Use CHG Soap or sage wipes as directed on instruction sheet   ____ Use inhalers on the day of surgery and bring to hospital day of surgery  ____ Stop Metformin and Janumet 2 days prior to surgery.    ____ Take 1/2 of usual insulin dose the night before surgery and none on the morning     surgery.   _x___ Follow recommendations from Cardiologist, Pulmonologist or PCP regarding          stopping Aspirin, Coumadin, Plavix ,Eliquis, Effient, or Pradaxa, and Pletal.  X____Stop Anti-inflammatories such as Advil, Aleve, Ibuprofen, Motrin, Naproxen, Naprosyn, Goodies powders or aspirin products. OK to take Tylenol and                          Celebrex.   _x___ Stop supplements until  after surgery.  But may continue Vitamin D, Vitamin B,       and multivitamin.   ____ Bring C-Pap to the hospital.

## 2018-06-17 NOTE — Pre-Procedure Instructions (Signed)
Called Dr Menz's office for orders. 

## 2018-06-17 NOTE — Pre-Procedure Instructions (Signed)
Called Dr Rudene Christians for orders.

## 2018-06-19 LAB — URINE CULTURE: Culture: NO GROWTH

## 2018-06-29 MED ORDER — CEFAZOLIN SODIUM-DEXTROSE 2-4 GM/100ML-% IV SOLN
2.0000 g | Freq: Once | INTRAVENOUS | Status: AC
  Administered 2018-06-30: 2 g via INTRAVENOUS

## 2018-06-30 ENCOUNTER — Inpatient Hospital Stay: Payer: Medicare Other | Admitting: Certified Registered"

## 2018-06-30 ENCOUNTER — Inpatient Hospital Stay: Payer: Medicare Other

## 2018-06-30 ENCOUNTER — Other Ambulatory Visit: Payer: Self-pay

## 2018-06-30 ENCOUNTER — Encounter
Admission: AD | Disposition: A | Discharge status: Skilled Nursing Facility | Payer: Self-pay | Source: Ambulatory Visit | Attending: Orthopedic Surgery

## 2018-06-30 ENCOUNTER — Inpatient Hospital Stay
Admission: AD | Admit: 2018-06-30 | Discharge: 2018-07-03 | DRG: 470 | Disposition: A | Discharge status: Skilled Nursing Facility | Payer: Medicare Other | Source: Ambulatory Visit | Attending: Orthopedic Surgery | Admitting: Orthopedic Surgery

## 2018-06-30 ENCOUNTER — Encounter: Payer: Self-pay | Admitting: *Deleted

## 2018-06-30 DIAGNOSIS — E785 Hyperlipidemia, unspecified: Secondary | ICD-10-CM | POA: Diagnosis present

## 2018-06-30 DIAGNOSIS — Z85528 Personal history of other malignant neoplasm of kidney: Secondary | ICD-10-CM

## 2018-06-30 DIAGNOSIS — Z79899 Other long term (current) drug therapy: Secondary | ICD-10-CM

## 2018-06-30 DIAGNOSIS — E876 Hypokalemia: Secondary | ICD-10-CM | POA: Diagnosis not present

## 2018-06-30 DIAGNOSIS — Z79890 Hormone replacement therapy (postmenopausal): Secondary | ICD-10-CM | POA: Diagnosis not present

## 2018-06-30 DIAGNOSIS — Z86011 Personal history of benign neoplasm of the brain: Secondary | ICD-10-CM

## 2018-06-30 DIAGNOSIS — K219 Gastro-esophageal reflux disease without esophagitis: Secondary | ICD-10-CM | POA: Diagnosis present

## 2018-06-30 DIAGNOSIS — Z836 Family history of other diseases of the respiratory system: Secondary | ICD-10-CM | POA: Diagnosis not present

## 2018-06-30 DIAGNOSIS — D649 Anemia, unspecified: Secondary | ICD-10-CM | POA: Diagnosis present

## 2018-06-30 DIAGNOSIS — Z800 Family history of malignant neoplasm of digestive organs: Secondary | ICD-10-CM | POA: Diagnosis not present

## 2018-06-30 DIAGNOSIS — I10 Essential (primary) hypertension: Secondary | ICD-10-CM | POA: Diagnosis present

## 2018-06-30 DIAGNOSIS — Z8261 Family history of arthritis: Secondary | ICD-10-CM

## 2018-06-30 DIAGNOSIS — Z96651 Presence of right artificial knee joint: Secondary | ICD-10-CM

## 2018-06-30 DIAGNOSIS — E039 Hypothyroidism, unspecified: Secondary | ICD-10-CM | POA: Diagnosis present

## 2018-06-30 DIAGNOSIS — D62 Acute posthemorrhagic anemia: Secondary | ICD-10-CM | POA: Diagnosis not present

## 2018-06-30 DIAGNOSIS — E119 Type 2 diabetes mellitus without complications: Secondary | ICD-10-CM | POA: Diagnosis present

## 2018-06-30 DIAGNOSIS — Z7984 Long term (current) use of oral hypoglycemic drugs: Secondary | ICD-10-CM | POA: Diagnosis not present

## 2018-06-30 DIAGNOSIS — Z801 Family history of malignant neoplasm of trachea, bronchus and lung: Secondary | ICD-10-CM | POA: Diagnosis not present

## 2018-06-30 DIAGNOSIS — M1711 Unilateral primary osteoarthritis, right knee: Principal | ICD-10-CM | POA: Diagnosis present

## 2018-06-30 DIAGNOSIS — Z888 Allergy status to other drugs, medicaments and biological substances status: Secondary | ICD-10-CM | POA: Diagnosis not present

## 2018-06-30 DIAGNOSIS — M109 Gout, unspecified: Secondary | ICD-10-CM | POA: Diagnosis present

## 2018-06-30 DIAGNOSIS — M069 Rheumatoid arthritis, unspecified: Secondary | ICD-10-CM | POA: Diagnosis present

## 2018-06-30 DIAGNOSIS — G8929 Other chronic pain: Secondary | ICD-10-CM | POA: Diagnosis present

## 2018-06-30 DIAGNOSIS — Z803 Family history of malignant neoplasm of breast: Secondary | ICD-10-CM | POA: Diagnosis not present

## 2018-06-30 DIAGNOSIS — Z833 Family history of diabetes mellitus: Secondary | ICD-10-CM

## 2018-06-30 DIAGNOSIS — Z808 Family history of malignant neoplasm of other organs or systems: Secondary | ICD-10-CM

## 2018-06-30 DIAGNOSIS — G8918 Other acute postprocedural pain: Secondary | ICD-10-CM

## 2018-06-30 HISTORY — PX: TOTAL KNEE ARTHROPLASTY: SHX125

## 2018-06-30 LAB — CBC
HCT: 38.5 % (ref 36.0–46.0)
Hemoglobin: 12 g/dL (ref 12.0–15.0)
MCH: 24.4 pg — AB (ref 26.0–34.0)
MCHC: 31.2 g/dL (ref 30.0–36.0)
MCV: 78.4 fL — ABNORMAL LOW (ref 80.0–100.0)
Platelets: 431 10*3/uL — ABNORMAL HIGH (ref 150–400)
RBC: 4.91 MIL/uL (ref 3.87–5.11)
RDW: 13.6 % (ref 11.5–15.5)
WBC: 6.8 10*3/uL (ref 4.0–10.5)
nRBC: 0 % (ref 0.0–0.2)

## 2018-06-30 LAB — ABO/RH: ABO/RH(D): O POS

## 2018-06-30 LAB — GLUCOSE, CAPILLARY
Glucose-Capillary: 123 mg/dL — ABNORMAL HIGH (ref 70–99)
Glucose-Capillary: 128 mg/dL — ABNORMAL HIGH (ref 70–99)
Glucose-Capillary: 119 mg/dL — ABNORMAL HIGH (ref 70–99)
Glucose-Capillary: 142 mg/dL — ABNORMAL HIGH (ref 70–99)
Glucose-Capillary: 134 mg/dL — ABNORMAL HIGH (ref 70–99)

## 2018-06-30 LAB — CREATININE, SERUM
Creatinine, Ser: 0.82 mg/dL (ref 0.44–1.00)
GFR calc Af Amer: >60 mL/min (ref >60)
GFR calc non Af Amer: >60 mL/min (ref >60)

## 2018-06-30 SURGERY — ARTHROPLASTY, KNEE, TOTAL
Anesthesia: Spinal | Laterality: Right

## 2018-06-30 MED ORDER — CALCIUM CARBONATE ANTACID 500 MG PO CHEW
1500.0000 mg | CHEWABLE_TABLET | Freq: Every day | ORAL | Status: DC
  Administered 2018-06-30 – 2018-07-03 (×4): 1500 mg via ORAL
  Filled 2018-06-30 (×3): qty 8

## 2018-06-30 MED ORDER — DIPHENHYDRAMINE HCL 12.5 MG/5ML PO ELIX: 12.5000 mg | ORAL_SOLUTION | ORAL | Status: DC | PRN

## 2018-06-30 MED ORDER — METHOCARBAMOL 1000 MG/10ML IJ SOLN
500.0000 mg | Freq: Four times a day (QID) | INTRAVENOUS | Status: DC | PRN
  Filled 2018-06-30: qty 5

## 2018-06-30 MED ORDER — METOCLOPRAMIDE HCL 5 MG/ML IJ SOLN
5.0000 mg | Freq: Three times a day (TID) | INTRAMUSCULAR | Status: DC | PRN
  Administered 2018-06-30: 10 mg via INTRAVENOUS
  Filled 2018-06-30: qty 2

## 2018-06-30 MED ORDER — FOLIC ACID 1 MG PO TABS: 1.0000 mg | ORAL_TABLET | Freq: Every day | ORAL | Status: DC | PRN

## 2018-06-30 MED ORDER — MAGNESIUM HYDROXIDE 400 MG/5ML PO SUSP: 30.0000 mL | Freq: Every day | ORAL | Status: DC | PRN

## 2018-06-30 MED ORDER — SODIUM CHLORIDE 0.9 % IV SOLN
INTRAVENOUS | Status: DC | PRN
  Administered 2018-06-30: 60 mL

## 2018-06-30 MED ORDER — BISACODYL 5 MG PO TBEC: 5.0000 mg | DELAYED_RELEASE_TABLET | Freq: Every day | ORAL | Status: DC | PRN

## 2018-06-30 MED ORDER — ENOXAPARIN SODIUM 30 MG/0.3ML ~~LOC~~ SOLN
30.0000 mg | Freq: Two times a day (BID) | SUBCUTANEOUS | Status: DC
  Administered 2018-07-01 – 2018-07-03 (×5): 30 mg via SUBCUTANEOUS
  Filled 2018-06-30 (×7): qty 0.3

## 2018-06-30 MED ORDER — CEFAZOLIN SODIUM-DEXTROSE 2-4 GM/100ML-% IV SOLN
2.0000 g | Freq: Four times a day (QID) | INTRAVENOUS | Status: AC
  Administered 2018-06-30 (×2): 2 g via INTRAVENOUS
  Filled 2018-06-30 (×2): qty 100

## 2018-06-30 MED ORDER — FENTANYL CITRATE (PF) 100 MCG/2ML IJ SOLN
INTRAMUSCULAR | Status: AC
  Filled 2018-06-30: qty 2

## 2018-06-30 MED ORDER — LEVETIRACETAM 500 MG PO TABS
500.0000 mg | ORAL_TABLET | Freq: Two times a day (BID) | ORAL | Status: DC
  Administered 2018-06-30 – 2018-07-03 (×6): 500 mg via ORAL
  Filled 2018-06-30 (×7): qty 1

## 2018-06-30 MED ORDER — ONDANSETRON HCL 4 MG/2ML IJ SOLN
4.0000 mg | Freq: Four times a day (QID) | INTRAMUSCULAR | Status: DC | PRN
  Administered 2018-06-30 – 2018-07-02 (×2): 4 mg via INTRAVENOUS
  Filled 2018-06-30 (×2): qty 2

## 2018-06-30 MED ORDER — LISINOPRIL 20 MG PO TABS
20.0000 mg | ORAL_TABLET | Freq: Every day | ORAL | Status: DC
  Administered 2018-07-01 – 2018-07-03 (×3): 20 mg via ORAL
  Filled 2018-06-30 (×3): qty 1

## 2018-06-30 MED ORDER — DOCUSATE SODIUM 100 MG PO CAPS
100.0000 mg | ORAL_CAPSULE | Freq: Two times a day (BID) | ORAL | Status: DC
  Administered 2018-06-30 – 2018-07-02 (×6): 100 mg via ORAL
  Filled 2018-06-30 (×7): qty 1

## 2018-06-30 MED ORDER — HYDROCODONE-ACETAMINOPHEN 5-325 MG PO TABS
1.0000 | ORAL_TABLET | ORAL | Status: DC | PRN
  Administered 2018-06-30 – 2018-07-01 (×2): 2 via ORAL
  Filled 2018-06-30: qty 2
  Filled 2018-06-30: qty 1
  Filled 2018-06-30: qty 2

## 2018-06-30 MED ORDER — INSULIN ASPART 100 UNIT/ML ~~LOC~~ SOLN
0.0000 [IU] | Freq: Three times a day (TID) | SUBCUTANEOUS | Status: DC
  Administered 2018-06-30 – 2018-07-02 (×3): 2 [IU] via SUBCUTANEOUS
  Filled 2018-06-30 (×3): qty 1

## 2018-06-30 MED ORDER — CEFAZOLIN SODIUM-DEXTROSE 2-4 GM/100ML-% IV SOLN
INTRAVENOUS | Status: AC
  Filled 2018-06-30: qty 100

## 2018-06-30 MED ORDER — LISINOPRIL-HYDROCHLOROTHIAZIDE 20-25 MG PO TABS: 1.0000 | ORAL_TABLET | Freq: Every day | ORAL | Status: DC

## 2018-06-30 MED ORDER — HYDROCODONE-ACETAMINOPHEN 7.5-325 MG PO TABS
1.0000 | ORAL_TABLET | ORAL | Status: DC | PRN
  Administered 2018-07-01 – 2018-07-02 (×3): 2 via ORAL
  Filled 2018-06-30 (×3): qty 2

## 2018-06-30 MED ORDER — SODIUM CHLORIDE 0.9 % IV SOLN
INTRAVENOUS | Status: DC
  Administered 2018-06-30: 07:00:00 via INTRAVENOUS

## 2018-06-30 MED ORDER — ONDANSETRON HCL 4 MG/2ML IJ SOLN
INTRAMUSCULAR | Status: AC
  Filled 2018-06-30: qty 2

## 2018-06-30 MED ORDER — GLIPIZIDE 5 MG PO TABS
5.0000 mg | ORAL_TABLET | Freq: Every day | ORAL | Status: DC
  Administered 2018-07-01 – 2018-07-03 (×3): 5 mg via ORAL
  Filled 2018-06-30 (×3): qty 1

## 2018-06-30 MED ORDER — ALUM & MAG HYDROXIDE-SIMETH 200-200-20 MG/5ML PO SUSP: 30.0000 mL | ORAL | Status: DC | PRN

## 2018-06-30 MED ORDER — MORPHINE SULFATE (PF) 2 MG/ML IV SOLN
0.5000 mg | INTRAVENOUS | Status: DC | PRN
  Administered 2018-06-30: 1 mg via INTRAVENOUS
  Filled 2018-06-30: qty 1

## 2018-06-30 MED ORDER — ONDANSETRON HCL 4 MG/2ML IJ SOLN
4.0000 mg | Freq: Once | INTRAMUSCULAR | Status: AC | PRN
  Administered 2018-06-30: 4 mg via INTRAVENOUS

## 2018-06-30 MED ORDER — MAGNESIUM CITRATE PO SOLN: 1.0000 | Freq: Once | ORAL | Status: DC | PRN

## 2018-06-30 MED ORDER — ACETAMINOPHEN 325 MG PO TABS
325.0000 mg | ORAL_TABLET | Freq: Four times a day (QID) | ORAL | Status: DC | PRN
  Administered 2018-07-03 (×2): 650 mg via ORAL
  Filled 2018-06-30 (×2): qty 2

## 2018-06-30 MED ORDER — METHOCARBAMOL 500 MG PO TABS
500.0000 mg | ORAL_TABLET | Freq: Four times a day (QID) | ORAL | Status: DC | PRN
  Administered 2018-06-30 – 2018-07-01 (×2): 500 mg via ORAL
  Filled 2018-06-30 (×2): qty 1

## 2018-06-30 MED ORDER — PROPOFOL 10 MG/ML IV BOLUS
INTRAVENOUS | Status: AC
  Filled 2018-06-30: qty 40

## 2018-06-30 MED ORDER — PROPOFOL 500 MG/50ML IV EMUL
INTRAVENOUS | Status: AC
  Filled 2018-06-30: qty 50

## 2018-06-30 MED ORDER — LEVOTHYROXINE SODIUM 150 MCG PO TABS
150.0000 ug | ORAL_TABLET | Freq: Every day | ORAL | Status: DC
  Administered 2018-07-01 – 2018-07-03 (×3): 150 ug via ORAL
  Filled 2018-06-30 (×2): qty 3
  Filled 2018-06-30 (×3): qty 1
  Filled 2018-06-30: qty 3

## 2018-06-30 MED ORDER — ACETAMINOPHEN 10 MG/ML IV SOLN
INTRAVENOUS | Status: AC
  Filled 2018-06-30: qty 100

## 2018-06-30 MED ORDER — MENTHOL 3 MG MT LOZG: 1.0000 | LOZENGE | OROMUCOSAL | Status: DC | PRN

## 2018-06-30 MED ORDER — PROPOFOL 500 MG/50ML IV EMUL
INTRAVENOUS | Status: DC | PRN
  Administered 2018-06-30: 75 ug/kg/min via INTRAVENOUS

## 2018-06-30 MED ORDER — TRAMADOL HCL 50 MG PO TABS
50.0000 mg | ORAL_TABLET | Freq: Four times a day (QID) | ORAL | Status: DC
  Administered 2018-06-30 – 2018-07-02 (×9): 50 mg via ORAL
  Filled 2018-06-30 (×10): qty 1

## 2018-06-30 MED ORDER — PHENOL 1.4 % MT LIQD: 1.0000 | OROMUCOSAL | Status: DC | PRN

## 2018-06-30 MED ORDER — ALLOPURINOL 100 MG PO TABS
100.0000 mg | ORAL_TABLET | Freq: Every day | ORAL | Status: DC
  Administered 2018-07-01 – 2018-07-03 (×3): 100 mg via ORAL
  Filled 2018-06-30 (×3): qty 1

## 2018-06-30 MED ORDER — HYDROCHLOROTHIAZIDE 25 MG PO TABS
25.0000 mg | ORAL_TABLET | Freq: Every day | ORAL | Status: DC
  Administered 2018-07-01 – 2018-07-03 (×3): 25 mg via ORAL
  Filled 2018-06-30 (×3): qty 1

## 2018-06-30 MED ORDER — BUPIVACAINE HCL (PF) 0.5 % IJ SOLN
INTRAMUSCULAR | Status: DC | PRN
  Administered 2018-06-30: 3 mL

## 2018-06-30 MED ORDER — COLCHICINE 0.6 MG PO TABS: 0.6000 mg | ORAL_TABLET | Freq: Every day | ORAL | Status: DC | PRN

## 2018-06-30 MED ORDER — PROPOFOL 10 MG/ML IV BOLUS
INTRAVENOUS | Status: DC | PRN
  Administered 2018-06-30: 30 mg via INTRAVENOUS

## 2018-06-30 MED ORDER — MORPHINE SULFATE 10 MG/ML IJ SOLN
INTRAMUSCULAR | Status: DC | PRN
  Administered 2018-06-30: 10 mg via SUBCUTANEOUS

## 2018-06-30 MED ORDER — ACETAMINOPHEN 10 MG/ML IV SOLN
INTRAVENOUS | Status: DC | PRN
  Administered 2018-06-30: 1000 mg via INTRAVENOUS

## 2018-06-30 MED ORDER — PANTOPRAZOLE SODIUM 40 MG PO TBEC
40.0000 mg | DELAYED_RELEASE_TABLET | Freq: Every day | ORAL | Status: DC
  Administered 2018-07-01 – 2018-07-03 (×3): 40 mg via ORAL
  Filled 2018-06-30 (×3): qty 1

## 2018-06-30 MED ORDER — ACETAMINOPHEN 500 MG PO TABS
500.0000 mg | ORAL_TABLET | Freq: Four times a day (QID) | ORAL | Status: AC
  Administered 2018-06-30 – 2018-07-01 (×4): 500 mg via ORAL
  Filled 2018-06-30 (×4): qty 1

## 2018-06-30 MED ORDER — LORATADINE 10 MG PO TABS
10.0000 mg | ORAL_TABLET | Freq: Every day | ORAL | Status: DC
  Administered 2018-07-01 – 2018-07-03 (×3): 10 mg via ORAL
  Filled 2018-06-30 (×3): qty 1

## 2018-06-30 MED ORDER — BUPIVACAINE-EPINEPHRINE (PF) 0.25% -1:200000 IJ SOLN
INTRAMUSCULAR | Status: DC | PRN
  Administered 2018-06-30: 30 mL

## 2018-06-30 MED ORDER — METOCLOPRAMIDE HCL 10 MG PO TABS: 5.0000 mg | ORAL_TABLET | Freq: Three times a day (TID) | ORAL | Status: DC | PRN

## 2018-06-30 MED ORDER — FENTANYL CITRATE (PF) 100 MCG/2ML IJ SOLN: 25.0000 ug | INTRAMUSCULAR | Status: DC | PRN

## 2018-06-30 MED ORDER — SODIUM CHLORIDE 0.9 % IV SOLN
INTRAVENOUS | Status: DC
  Administered 2018-06-30 – 2018-07-01 (×2): via INTRAVENOUS

## 2018-06-30 MED ORDER — FENTANYL CITRATE (PF) 100 MCG/2ML IJ SOLN
INTRAMUSCULAR | Status: DC | PRN
  Administered 2018-06-30 (×2): 50 ug via INTRAVENOUS
  Administered 2018-06-30: 75 ug via INTRAVENOUS
  Administered 2018-06-30: 25 ug via INTRAVENOUS

## 2018-06-30 MED ORDER — ZOLPIDEM TARTRATE 5 MG PO TABS: 5.0000 mg | ORAL_TABLET | Freq: Every evening | ORAL | Status: DC | PRN

## 2018-06-30 MED ORDER — ONDANSETRON HCL 4 MG PO TABS: 4.0000 mg | ORAL_TABLET | Freq: Four times a day (QID) | ORAL | Status: DC | PRN

## 2018-06-30 SURGICAL SUPPLY — 61 items
BANDAGE ACE 6X5 VEL STRL LF (GAUZE/BANDAGES/DRESSINGS) ×2 IMPLANT
BLADE SAW 1 (BLADE) ×2 IMPLANT
CANISTER SUCT 1200ML W/VALVE (MISCELLANEOUS) ×2 IMPLANT
CANISTER SUCT 3000ML PPV (MISCELLANEOUS) ×4 IMPLANT
CEMENT HV SMART SET (Cement) ×4 IMPLANT
CHLORAPREP W/TINT 26ML (MISCELLANEOUS) ×4 IMPLANT
COOLER POLAR GLACIER W/PUMP (MISCELLANEOUS) ×2 IMPLANT
COVER WAND RF STERILE (DRAPES) ×2 IMPLANT
CUFF TOURN 24 STER (MISCELLANEOUS) ×2 IMPLANT
CUFF TOURN 30 STER DUAL PORT (MISCELLANEOUS) IMPLANT
DRAPE SHEET LG 3/4 BI-LAMINATE (DRAPES) ×4 IMPLANT
ELECT CAUTERY BLADE 6.4 (BLADE) ×2 IMPLANT
ELECT REM PT RETURN 9FT ADLT (ELECTROSURGICAL) ×2
ELECTRODE REM PT RTRN 9FT ADLT (ELECTROSURGICAL) ×1 IMPLANT
FEMORAL COMP SZ4 RIGHT SPHERE (Femur) ×2 IMPLANT
GAUZE PETRO XEROFOAM 1X8 (MISCELLANEOUS) ×2 IMPLANT
GAUZE SPONGE 4X4 12PLY STRL (GAUZE/BANDAGES/DRESSINGS) ×2 IMPLANT
GLOVE BIOGEL PI IND STRL 9 (GLOVE) ×1 IMPLANT
GLOVE BIOGEL PI INDICATOR 9 (GLOVE) ×1
GLOVE INDICATOR 8.0 STRL GRN (GLOVE) ×2 IMPLANT
GLOVE SURG ORTHO 8.0 STRL STRW (GLOVE) ×2 IMPLANT
GLOVE SURG SYN 9.0  PF PI (GLOVE) ×1
GLOVE SURG SYN 9.0 PF PI (GLOVE) ×1 IMPLANT
GOWN SRG 2XL LVL 4 RGLN SLV (GOWNS) ×1 IMPLANT
GOWN STRL NON-REIN 2XL LVL4 (GOWNS) ×1
GOWN STRL REUS W/ TWL LRG LVL3 (GOWN DISPOSABLE) ×1 IMPLANT
GOWN STRL REUS W/ TWL XL LVL3 (GOWN DISPOSABLE) ×1 IMPLANT
GOWN STRL REUS W/TWL LRG LVL3 (GOWN DISPOSABLE) ×1
GOWN STRL REUS W/TWL XL LVL3 (GOWN DISPOSABLE) ×1
HOLDER FOLEY CATH W/STRAP (MISCELLANEOUS) ×2 IMPLANT
HOOD PEEL AWAY FLYTE STAYCOOL (MISCELLANEOUS) ×4 IMPLANT
INSERT TIBIAL SZ4 RIGHT 10MM (Insert) ×2 IMPLANT
KIT TURNOVER KIT A (KITS) ×2 IMPLANT
KNIFE SCULPS 14X20 (INSTRUMENTS) ×2 IMPLANT
NDL SAFETY ECLIPSE 18X1.5 (NEEDLE) ×1 IMPLANT
NEEDLE HYPO 18GX1.5 SHARP (NEEDLE) ×1
NEEDLE SPNL 18GX3.5 QUINCKE PK (NEEDLE) ×2 IMPLANT
NEEDLE SPNL 20GX3.5 QUINCKE YW (NEEDLE) ×2 IMPLANT
NS IRRIG 1000ML POUR BTL (IV SOLUTION) ×2 IMPLANT
PACK TOTAL KNEE (MISCELLANEOUS) ×2 IMPLANT
PAD WRAPON POLAR KNEE (MISCELLANEOUS) ×1 IMPLANT
PATELLA RESURFACING MEDACTA SZ (Bone Implant) ×2 IMPLANT
PULSAVAC PLUS IRRIG FAN TIP (DISPOSABLE) ×2
SCALPEL PROTECTED #10 DISP (BLADE) ×4 IMPLANT
SOL .9 NS 3000ML IRR  AL (IV SOLUTION) ×1
SOL .9 NS 3000ML IRR UROMATIC (IV SOLUTION) ×1 IMPLANT
STAPLER SKIN PROX 35W (STAPLE) ×2 IMPLANT
STEM EXTENSION 11MMX30MM (Stem) ×2 IMPLANT
SUCTION FRAZIER HANDLE 10FR (MISCELLANEOUS) ×1
SUCTION TUBE FRAZIER 10FR DISP (MISCELLANEOUS) ×1 IMPLANT
SUT DVC 2 QUILL PDO  T11 36X36 (SUTURE) ×1
SUT DVC 2 QUILL PDO T11 36X36 (SUTURE) ×1 IMPLANT
SUT V-LOC 90 ABS DVC 3-0 CL (SUTURE) ×2 IMPLANT
SYR 20CC LL (SYRINGE) ×2 IMPLANT
SYR 50ML LL SCALE MARK (SYRINGE) ×4 IMPLANT
TIP FAN IRRIG PULSAVAC PLUS (DISPOSABLE) ×1 IMPLANT
TOWEL OR 17X26 4PK STRL BLUE (TOWEL DISPOSABLE) ×2 IMPLANT
TOWER CARTRIDGE SMART MIX (DISPOSABLE) ×2 IMPLANT
TRAY FOLEY MTR SLVR 16FR STAT (SET/KITS/TRAYS/PACK) ×2 IMPLANT
TRAY TIBIAL FIXED T314 RIGHT (Miscellaneous) ×2 IMPLANT
WRAPON POLAR PAD KNEE (MISCELLANEOUS) ×2

## 2018-06-30 NOTE — Clinical Social Work Note (Signed)
CSW received referral for SNF.  PT recommending home health, plan is to discharge home with home health.  CSW to sign off please re-consult if social work needs arise.  Jones Broom. [place], MSW, [place]

## 2018-06-30 NOTE — H&P (Signed)
Reviewed paper H+P, will be scanned into chart. No changes noted.  

## 2018-06-30 NOTE — Anesthesia Procedure Notes (Signed)
Spinal  Patient location during procedure: OR Start time: 06/30/2018 7:15 AM End time: 06/30/2018 7:25 AM Staffing Anesthesiologist: Alvin Critchley, MD Resident/CRNA: Mashell Sieben, Einar Grad, CRNA Performed: resident/CRNA  Preanesthetic Checklist Completed: patient identified, site marked, surgical consent, pre-op evaluation, timeout performed, IV checked, risks and benefits discussed and monitors and equipment checked Spinal Block Patient position: sitting Prep: DuraPrep Patient monitoring: heart rate, cardiac monitor, continuous pulse ox and blood pressure Approach: midline Location: L3-4 Injection technique: single-shot Needle Needle type: Sprotte  Needle gauge: 24 G Needle length: 9 cm Assessment Sensory level: T8

## 2018-06-30 NOTE — Anesthesia Preprocedure Evaluation (Signed)
Anesthesia Evaluation  Patient identified by MRN, date of birth, ID band Patient awake    Reviewed: Allergy & Precautions, H&P , NPO status , Patient's Chart, lab work & pertinent test results, reviewed documented beta blocker date and time   History of Anesthesia Complications Negative for: history of anesthetic complications  Airway Mallampati: I  TM Distance: >3 FB Neck ROM: full    Dental  (+) Dental Advidsory Given, Caps, Teeth Intact, Missing   Pulmonary neg pulmonary ROS,           Cardiovascular Exercise Tolerance: Good hypertension, (-) angina(-) CAD, (-) Past MI, (-) Cardiac Stents and (-) CABG (-) dysrhythmias (-) Valvular Problems/Murmurs     Neuro/Psych Seizures -,  negative psych ROS   GI/Hepatic Neg liver ROS, GERD  ,  Endo/Other  diabetes, Well ControlledHypothyroidism   Renal/GU Renal disease  negative genitourinary   Musculoskeletal  (+) Arthritis , Rheumatoid disorders,    Abdominal   Peds  Hematology negative hematology ROS (+) anemia ,   Anesthesia Other Findings Past Medical History: No date: Anemia No date: Arthritis No date: Cancer ([place])     Comment:  RENAL CELL No date: Diabetes mellitus without complication (HCC) No date: GERD (gastroesophageal reflux disease) No date: Hyperlipidemia No date: Hypertension No date: Hypothyroidism No date: Psoriasis No date: Seizures ([place])     Comment:  LAST SEIZURE ON 02-08-09   Reproductive/Obstetrics negative OB ROS                             Anesthesia Physical  Anesthesia Plan  ASA: III  Anesthesia Plan: Spinal   Post-op Pain Management:    Induction: Intravenous  PONV Risk Score and Plan: 3  Airway Management Planned: Nasal Cannula  Additional Equipment:   Intra-op Plan:   Post-operative Plan:   Informed Consent: I have reviewed the patients History and Physical, chart, labs and discussed the  procedure including the risks, benefits and alternatives for the proposed anesthesia with the patient or authorized representative who has indicated his/her understanding and acceptance.     Dental Advisory Given  Plan Discussed with: Anesthesiologist, CRNA and Surgeon  Anesthesia Plan Comments:         Anesthesia Quick Evaluation

## 2018-06-30 NOTE — Op Note (Signed)
06/30/2018  9:39 AM  PATIENT:  Vanessa Esparza  78 y.o. female  PRE-OPERATIVE DIAGNOSIS:  CHRONIC PAIN RIGHT KNEE, osteoarthritis  POST-OPERATIVE DIAGNOSIS:  primary osteoarthritis right knee  PROCEDURE:  Procedure(s): TOTAL KNEE ARTHROPLASTY-RIGHT (Right)  SURGEON: Laurene Footman, MD  ASSISTANTS: Rachelle Hora, PA-C  ANESTHESIA:   spinal  EBL:  Total I/O In: -  Out: 175 [Urine:125; Blood:50]  BLOOD ADMINISTERED:none  DRAINS: none   LOCAL MEDICATIONS USED:  MARCAINE    and Exparel and morphine SPECIMEN:  No Specimen  DISPOSITION OF SPECIMEN:  N/A  COUNTS:  YES  TOURNIQUET:   Total Tourniquet Time Documented: Thigh (Right) - 75 minutes Total: Thigh (Right) - 75 minutes   IMPLANTS: Medacta GM K sphere 4 right femur 3TI4 tibial baseplate with short stem and 10 mm insert, 3 patella all components cemented  DICTATION: .Dragon Dictation patient was brought to the operating room and after adequate spinal anesthesia was obtained the right leg was prepped and draped in sterile fashion.  After patient identification and timeout procedures were completed tourniquet was raised to 275,000,000 m of mercury and a midline skin incision was made followed by medial parapatellar arthrotomy.  Inspection revealed exposed bone in all compartments with eburnated bone in the lateral compartment patellofemoral joint.  The ACL and fat pad were excised all the anterior and the menisci and proximal tibia cut carried out using the extra medullary guide.  Next the distal femur drill hole was made and the intramedullary guide used for distal femoral cut resecting additional 4 mm because of an extensive flexion contracture.  The femur sized to a size 4 anterior posterior and chamfer cuts made.  With trial tibial baseplate placed proximal tibial preparation was carried out with proximal tibia reaming and placement of the keel punch with short stem the 4 femur was placed in a 10 mm insert gave good stability  and near full extension osteophytes were removed off the posterior femur and full extension could then be obtained the trochlear groove cut was made for the distal femur for the trochlear groove and the patella was cut using a freehand technique after first measured with a caliper and measured a size 3 patella after 3 drill holes were made the bony surfaces were thoroughly irrigated and dried and the tibial component cemented into place first followed by the polyethylene component and torque screw tightened with a torque screwdriver the 4 femur was placed and the knee held in extension patellar button clamped into place.  After the cemented set excess cement was removed and the patella was stable through range of motion.  Full extension was obtained.  Tourniquet was let down and hemostasis checked electrocautery.  The arthrotomy was repaired with a heavy Quill suture followed by a 3 OV lock subcutaneous skin closure followed by staples and incisional wound VAC.  PLAN OF CARE: Admit to inpatient   PATIENT DISPOSITION:  PACU - hemodynamically stable.

## 2018-06-30 NOTE — Evaluation (Signed)
Physical Therapy Evaluation Patient Details Name: Vanessa Esparza MRN: [id number] DOB: 02/24/1941 Today's Date: 06/30/2018   History of Present Illness  Pt is a 78 yo female diagnosed with R knee OA and is s/p elective R TKA.  PMH includes: seizures (last one 02/08/09), anemia, renal cell CA, DM, HTN, and hypothyroidism.      Clinical Impression  Pt presents with deficits in strength, transfers, mobility, gait, R knee ROM, and activity tolerance but did well during the session especially considering POD#0 status.  Pt required only min A during sup to/from sit and was able to stand from an elevated EOB with CGA and cues for sequencing.  Pt was able to take several steps at the EOB with a RW and good stability but was ultimately limited by nausea, nsg notifed.  Pt should progress well while in acute care and  will benefit from HHPT services upon discharge to safely address above deficits for decreased caregiver assistance and eventual return to PLOF.      Follow Up Recommendations Home health PT;Supervision for mobility/OOB    Equipment Recommendations  None recommended by PT    Recommendations for Other Services       Precautions / Restrictions Precautions Precautions: Knee;Fall Required Braces or Orthoses: (Pt able to complete Ind RLE SLRs without extensor lag) Restrictions Weight Bearing Restrictions: Yes RLE Weight Bearing: Weight bearing as tolerated      Mobility  Bed Mobility Overal bed mobility: Needs Assistance Bed Mobility: Supine to Sit;Sit to Supine     Supine to sit: Min assist Sit to supine: Min assist   General bed mobility comments: Min A for RLE in/out of bed with min verbal cues for general sequencing.   Transfers Overall transfer level: Needs assistance Equipment used: Rolling walker (2 wheeled) Transfers: Sit to/from Stand Sit to Stand: From elevated surface;Min guard         General transfer comment: Mod verbal cues for general transfer sequencing    Ambulation/Gait Ambulation/Gait assistance: Min guard Gait Distance (Feet): 3 Feet Assistive device: Rolling walker (2 wheeled) Gait Pattern/deviations: Step-to pattern;Trunk flexed;Decreased step length - left;Decreased stance time - right;Antalgic Gait velocity: decreased   General Gait Details: Several forward/backward and side steps at the EOB with heavy lean on the RW but steady without LOB or knee buckling  Stairs            Wheelchair Mobility    Modified Rankin (Stroke Patients Only)       Balance Overall balance assessment: No apparent balance deficits (not formally assessed)                                           Pertinent Vitals/Pain Pain Assessment: 0-10 Pain Score: 4  Pain Location: R knee Pain Descriptors / Indicators: Aching;Sore Pain Intervention(s): Monitored during session;Premedicated before session;Repositioned    Home Living Family/patient expects to be discharged to:: Private residence Living Arrangements: Spouse/significant other Available Help at Discharge: Family;Available 24 hours/day;Other (Comment)(Pt's spouse has had multiple orthopedic surgeries and would not be able to provide much physical assistance, children all out of town.) Type of Home: House Home Access: Stairs to enter Entrance Stairs-Rails: Right Entrance Stairs-Number of Steps: 2 Home Layout: One level [place]: Grab bars - toilet;Other (comment);Walker - 2 wheels;Grab bars - tub/shower Additional Comments: Toilet is elevated height with grab bar and arm rests; pt has a shower  seat in storage in the home    Prior Function Level of Independence: Independent         Comments: Pt ind with amb community distances without an AD, no fall history, Ind with ADLs     Hand Dominance   Dominant Hand: Right    Extremity/Trunk Assessment   Upper Extremity Assessment Upper Extremity Assessment: Defer to OT evaluation    Lower Extremity  Assessment Lower Extremity Assessment: Generalized weakness;RLE deficits/detail RLE Deficits / Details: R hip flex strength >/= 3/5 with pt able to perform Ind SLRs without extensor lag; B ankle DF/PF strong against manual resistance with full AROM RLE: Unable to fully assess due to pain RLE Sensation: WNL       Communication   Communication: No difficulties  Cognition Arousal/Alertness: Awake/alert Behavior During Therapy: WFL for tasks assessed/performed Overall Cognitive Status: Within Functional Limits for tasks assessed                                        General Comments      Exercises Total Joint Exercises Ankle Circles/Pumps: AROM;Both;10 reps;Strengthening Quad Sets: AROM;Strengthening;Right;5 reps;10 reps Hip ABduction/ADduction: AROM;AAROM;Both;10 reps Straight Leg Raises: AROM;AAROM;Both;10 reps Long Arc Quad: AROM;Strengthening;Right;10 reps;15 reps Knee Flexion: AROM;Strengthening;Right;10 reps;15 reps Goniometric ROM: R knee AROM: 12-76 deg Marching in Standing: AROM;Both;5 reps;Standing Other Exercises Other Exercises: Pt/family education on RLE positioning to encourage R knee ext PROM Other Exercises: HEP education per handout   Assessment/Plan    PT Assessment Patient needs continued PT services  PT Problem List Decreased strength;Decreased range of motion;Decreased activity tolerance;Decreased balance;Decreased mobility;Decreased knowledge of use of DME       PT Treatment Interventions DME instruction;Gait training;Stair training;Functional mobility training;Therapeutic activities;Therapeutic exercise;Balance training;Patient/family education    PT Goals (Current goals can be found in the Care Plan section)  Acute Rehab PT Goals Patient Stated Goal: To walk again PT Goal Formulation: With patient Time For Goal Achievement: 07/13/18 Potential to Achieve Goals: Good    Frequency BID   Barriers to discharge         Co-evaluation               AM-PAC PT "6 Clicks" Mobility  Outcome Measure Help needed turning from your back to your side while in a flat bed without using bedrails?: A Little Help needed moving from lying on your back to sitting on the side of a flat bed without using bedrails?: A Little Help needed moving to and from a bed to a chair (including a wheelchair)?: A Little Help needed standing up from a chair using your arms (e.g., wheelchair or bedside chair)?: A Little Help needed to walk in hospital room?: A Lot Help needed climbing 3-5 steps with a railing? : A Lot 6 Click Score: 16    End of Session Equipment Utilized During Treatment: Gait belt Activity Tolerance: Other (comment)(Nausea, nursing notified) Patient left: in bed;with SCD's reapplied;with call bell/phone within reach;with bed alarm set;with family/visitor present;Other (comment)(Polar care donned to R knee) Nurse Communication: Mobility status;Other (comment)(Pt requesting nausea meds) PT Visit Diagnosis: Muscle weakness (generalized) (M62.81);Other abnormalities of gait and mobility (R26.89)    Time: 1610-9604 PT Time Calculation (min) (ACUTE ONLY): 45 min   Charges:   PT Evaluation $PT Eval Low Complexity: 1 Low PT Treatments $Therapeutic Exercise: 8-22 mins $Therapeutic Activity: 8-22 mins        D. Scott  Heath Tesler PT, DPT 06/30/18, 4:12 PM

## 2018-06-30 NOTE — Anesthesia Post-op Follow-up Note (Signed)
Anesthesia QCDR form completed.        

## 2018-06-30 NOTE — Transfer of Care (Signed)
Immediate Anesthesia Transfer of Care Note  Patient: Vanessa Esparza  Procedure(s) Performed: TOTAL KNEE ARTHROPLASTY-RIGHT (Right )  Patient Location: PACU  Anesthesia Type:Spinal  Level of Consciousness: awake, alert  and oriented  Airway & Oxygen Therapy: Patient Spontanous Breathing and Patient connected to nasal cannula oxygen  Post-op Assessment: Report given to RN and Post -op Vital signs reviewed and stable  Post vital signs: Reviewed and stable  Last Vitals:  Vitals Value Taken Time  BP    Temp    Pulse    Resp    SpO2      Last Pain:  Vitals:   06/30/18 0622  TempSrc: Tympanic      Patients Stated Pain Goal: 0 (25/05/39 7673)  Complications: No apparent anesthesia complications

## 2018-06-30 NOTE — Progress Notes (Signed)
15 minute call to floor. 

## 2018-06-30 NOTE — Progress Notes (Signed)
Patient resting in bed. IVF infusing. Foley patent and draining clear yellow urine. Pain controlled with pain meds. Bone foam in place. Call bell in reach, bed lowest position. Continue to monitor.

## 2018-07-01 LAB — CBC
HCT: 26.7 % — ABNORMAL LOW (ref 36.0–46.0)
Hemoglobin: 8.5 g/dL — ABNORMAL LOW (ref 12.0–15.0)
MCH: 24.5 pg — ABNORMAL LOW (ref 26.0–34.0)
MCHC: 31.8 g/dL (ref 30.0–36.0)
MCV: 76.9 fL — ABNORMAL LOW (ref 80.0–100.0)
NRBC: 0 % (ref 0.0–0.2)
Platelets: 272 10*3/uL (ref 150–400)
RBC: 3.47 MIL/uL — ABNORMAL LOW (ref 3.87–5.11)
RDW: 13.4 % (ref 11.5–15.5)
WBC: 4.9 10*3/uL (ref 4.0–10.5)

## 2018-07-01 LAB — GLUCOSE, CAPILLARY
Glucose-Capillary: 105 mg/dL — ABNORMAL HIGH (ref 70–99)
Glucose-Capillary: 144 mg/dL — ABNORMAL HIGH (ref 70–99)
Glucose-Capillary: 113 mg/dL — ABNORMAL HIGH (ref 70–99)
Glucose-Capillary: 128 mg/dL — ABNORMAL HIGH (ref 70–99)

## 2018-07-01 LAB — BASIC METABOLIC PANEL
Anion gap: 8 (ref 5–15)
BUN: 13 mg/dL (ref 8–23)
CO2: 29 mmol/L (ref 22–32)
Calcium: 7.6 mg/dL — ABNORMAL LOW (ref 8.9–10.3)
Chloride: 98 mmol/L (ref 98–111)
Creatinine, Ser: 0.8 mg/dL (ref 0.44–1.00)
GFR calc non Af Amer: >60 mL/min (ref >60)
Glucose, Bld: 108 mg/dL — ABNORMAL HIGH (ref 70–99)
Potassium: 3.5 mmol/L (ref 3.5–5.1)
Sodium: 135 mmol/L (ref 135–145)

## 2018-07-01 MED ORDER — FE FUMARATE-B12-VIT C-FA-IFC PO CAPS
1.0000 | ORAL_CAPSULE | Freq: Three times a day (TID) | ORAL | Status: DC
  Administered 2018-07-01 – 2018-07-03 (×7): 1 via ORAL
  Filled 2018-07-01 (×9): qty 1

## 2018-07-01 NOTE — NC FL2 (Signed)
[place] LEVEL OF CARE SCREENING TOOL     IDENTIFICATION  Patient Name: Vanessa Esparza Birthdate: [birth year] Sex: female Admission Date (Current Location): 06/30/2018  [place] and [place] Number:  Engineering geologist and Address:  [facility], [address] [place], [place] [address]      Provider Number: [id number]  Attending Physician Name and Address:  Hessie Knows, MD  Relative Name and Phone Number:       Current Level of Care: Hospital Recommended Level of Care: [place] Prior Approval Number:    Date Approved/Denied:   PASRR Number: [id number])  Discharge Plan: SNF    Current Diagnoses: Patient Active Problem List   Diagnosis Date Noted  . S/P TKR (total knee replacement) using cement, right 06/30/2018  . 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 Personal history of renal cell carcinoma 09/30/2016  . Anemia 08/16/2016  . Diabetes mellitus type 2, uncomplicated ([place]) 62/83/6629  . Hyperlipidemia, unspecified 08/16/2016  . Hypertension 08/16/2016  . Thyroid disease 08/16/2016  . Acquired cyst of kidney 04/17/2012  . Mixed urge and stress incontinence 04/17/2012  . Renal colic 47/65/4650  . Malignant neoplasm of kidney ([place]) 03/28/2011  . Benign neoplasm of brain ([place]) 07/04/2008  . Esophageal reflux 07/04/2008  . Hypercholesterolemia 07/04/2008  . Hypertension, benign 07/04/2008  . Hypothyroidism (acquired) 07/04/2008    Orientation RESPIRATION BLADDER Height & Weight     Self, Time, Situation  Normal Continent Weight: 191 lb (86.6 kg) Height:  5\' 10"  (177.8 cm)  BEHAVIORAL SYMPTOMS/MOOD NEUROLOGICAL BOWEL NUTRITION STATUS      Continent Diet(Diet: Carb Modified. )  AMBULATORY STATUS COMMUNICATION OF NEEDS Skin   Extensive Assist Verbally Surgical wounds(Incision: Right Knee. )                       Personal Care Assistance Level of  Assistance  Bathing, Feeding, Dressing Bathing Assistance: Limited assistance Feeding assistance: Independent Dressing Assistance: Limited assistance     Functional Limitations Info  Sight, Hearing, Speech Sight Info: Adequate Hearing Info: Adequate Speech Info: Adequate    SPECIAL CARE FACTORS FREQUENCY  PT (By licensed PT), OT (By licensed OT)     PT Frequency: (5) OT Frequency: (5)            Contractures      Additional Factors Info  Code Status, Allergies Code Status Info: (Full Code. ) Allergies Info: (Arava Leflunomide, Methotrexate Derivatives, Statins)           Current Medications (07/01/2018):  This is the current hospital active medication list Current Facility-Administered Medications  Medication Dose Route Frequency Provider Last Rate Last Dose  . 0.9 %  sodium chloride infusion   Intravenous Continuous Hessie Knows, MD 75 mL/hr at 07/01/18 [phone]    . acetaminophen (TYLENOL) tablet 325-650 mg  325-650 mg Oral Q6H PRN Hessie Knows, MD      . allopurinol (ZYLOPRIM) tablet 100 mg  100 mg Oral Daily Hessie Knows, MD   100 mg at 07/01/18 1000  . alum & mag hydroxide-simeth (MAALOX/MYLANTA) 200-200-20 MG/5ML suspension 30 mL  30 mL Oral Q4H PRN Hessie Knows, MD      . bisacodyl (DULCOLAX) EC tablet 5 mg  5 mg Oral Daily PRN Hessie Knows, MD      . calcium carbonate (TUMS - dosed in mg elemental calcium) chewable tablet 1,500 mg  1,500 mg Oral Daily Hessie Knows, MD  1,500 mg at 07/01/18 0958  . colchicine tablet 0.6 mg  0.6 mg Oral Daily PRN Hessie Knows, MD      . diphenhydrAMINE (BENADRYL) 12.5 MG/5ML elixir 12.5-25 mg  12.5-25 mg Oral Q4H PRN Hessie Knows, MD      . docusate sodium (COLACE) capsule 100 mg  100 mg Oral BID Hessie Knows, MD   100 mg at 07/01/18 0958  . enoxaparin (LOVENOX) injection 30 mg  30 mg Subcutaneous Q12H Hessie Knows, MD   30 mg at 07/01/18 0957  . ferrous [id number] C-folic acid (TRINSICON / FOLTRIN) capsule 1  capsule  1 capsule Oral TID Johney Maine, MD   1 capsule at 96/75/91 6384  . folic acid (FOLVITE) tablet 1 mg  1 mg Oral Daily PRN Hessie Knows, MD      . glipiZIDE (GLUCOTROL) tablet 5 mg  5 mg Oral QAC breakfast Hessie Knows, MD   5 mg at 07/01/18 0958  . lisinopril (PRINIVIL,ZESTRIL) tablet 20 mg  20 mg Oral Daily Hessie Knows, MD   20 mg at 07/01/18 6659   And  . hydrochlorothiazide (HYDRODIURIL) tablet 25 mg  25 mg Oral Daily Hessie Knows, MD   25 mg at 07/01/18 0958  . HYDROcodone-acetaminophen (NORCO) 7.5-325 MG per tablet 1-2 tablet  1-2 tablet Oral Q4H PRN Hessie Knows, MD      . HYDROcodone-acetaminophen (NORCO/VICODIN) 5-325 MG per tablet 1-2 tablet  1-2 tablet Oral Q4H PRN Hessie Knows, MD   2 tablet at 07/01/18 1421  . insulin aspart (novoLOG) injection 0-15 Units  0-15 Units Subcutaneous TID WC Hessie Knows, MD   2 Units at 07/01/18 1155  . levETIRAcetam (KEPPRA) tablet 500 mg  500 mg Oral BID Hessie Knows, MD   500 mg at 07/01/18 0958  . levothyroxine (SYNTHROID, LEVOTHROID) tablet 150 mcg  150 mcg Oral QAC breakfast Hessie Knows, MD   150 mcg at 07/01/18 0650  . loratadine (CLARITIN) tablet 10 mg  10 mg Oral Daily Hessie Knows, MD   10 mg at 07/01/18 1000  . magnesium citrate solution 1 Bottle  1 Bottle Oral Once PRN Hessie Knows, MD      . magnesium hydroxide (MILK OF MAGNESIA) suspension 30 mL  30 mL Oral Daily PRN Hessie Knows, MD      . menthol-cetylpyridinium (CEPACOL) lozenge 3 mg  1 lozenge Oral PRN Hessie Knows, MD       Or  . phenol (CHLORASEPTIC) mouth spray 1 spray  1 spray Mouth/Throat PRN Hessie Knows, MD      . methocarbamol (ROBAXIN) tablet 500 mg  500 mg Oral Q6H PRN Hessie Knows, MD   500 mg at 07/01/18 1421   Or  . methocarbamol (ROBAXIN) 500 mg in dextrose 5 % 50 mL IVPB  500 mg Intravenous Q6H PRN Hessie Knows, MD      . metoCLOPramide (REGLAN) tablet 5-10 mg  5-10 mg Oral Q8H PRN Hessie Knows, MD       Or  . metoCLOPramide (REGLAN)  injection 5-10 mg  5-10 mg Intravenous Q8H PRN Hessie Knows, MD   10 mg at 06/30/18 1216  . morphine 2 MG/ML injection 0.5-1 mg  0.5-1 mg Intravenous Q2H PRN Hessie Knows, MD   1 mg at 06/30/18 1552  . ondansetron (ZOFRAN) tablet 4 mg  4 mg Oral Q6H PRN Hessie Knows, MD       Or  . ondansetron [facility]) injection 4 mg  4 mg Intravenous Q6H PRN Hessie Knows, MD  4 mg at 06/30/18 1545  . pantoprazole (PROTONIX) EC tablet 40 mg  40 mg Oral Daily Hessie Knows, MD   40 mg at 07/01/18 0959  . traMADol (ULTRAM) tablet 50 mg  50 mg Oral Q6H Hessie Knows, MD   50 mg at 07/01/18 1155  . zolpidem (AMBIEN) tablet 5 mg  5 mg Oral QHS PRN Hessie Knows, MD         Discharge Medications: Please see discharge summary for a list of discharge medications.  Relevant Imaging Results:  Relevant Lab Results:   Additional Information (SSN: [id number])  Mirren Gest, Veronia Beets, LCSW

## 2018-07-01 NOTE — Clinical Social Work Note (Signed)
Clinical Social Work Assessment  Patient Details  Name: Vanessa Esparza MRN: [id number] Date of Birth: 1940-06-06  Date of referral:  07/01/18               Reason for consult:  Facility Placement                Permission sought to share information with:  Chartered certified accountant granted to share information::  Yes, Verbal Permission Granted  Name::      [place]::   [place]   Relationship::     Contact Information:     Housing/Transportation Living arrangements for the past 2 months:  [place] of Information:  Patient, Adult Children, Spouse Patient Interpreter Needed:  None Criminal Activity/Legal Involvement Pertinent to Current Situation/Hospitalization:  No - Comment as needed Significant Relationships:  Adult Children, Spouse Lives with:  Spouse Do you feel safe going back to the place where you live?  Yes Need for family participation in patient care:  Yes (Comment)  Care giving concerns:  Patient lives in [place] with her husband Vanessa Esparza.    Social Worker assessment / plan:  Holiday representative (CSW) received SNF consult. PT is recommending SNF. CSW met with patient and her husband Vanessa Esparza and son Vanessa Esparza were at bedside. Patient was alert and oriented X4 and was laying in the bed. CSW introduced self and explained role of CSW department. Per patient she lives in [place] with her husband. CSW explained SNF process and that [facility] will have to approve it. Patient is agreeable to SNF search in [facility] FL2 complete and faxed out. CSW started [facility] SNF authorization through [place]. CSW will continue to follow and assist as needed.   Employment status:  Disabled (Comment on whether or not currently receiving Disability), Retired Nurse, adult PT Recommendations:  [place] / Referral to community resources:  [place]  Patient/Family's Response to care:  Patient and her husband are agreeable to [facility] in [place].   Patient/Family's Understanding of and Emotional Response to Diagnosis, Current Treatment, and Prognosis:  Patient and her husband were very pleasant and thanked CSW for assistance.   Emotional Assessment Appearance:  Appears stated age Attitude/Demeanor/Rapport:    Affect (typically observed):  Accepting, Adaptable, Pleasant Orientation:  Oriented to Self, Oriented to Place, Oriented to  Time, Oriented to Situation Alcohol / Substance use:  Not Applicable Psych involvement (Current and /or in the community):  No (Comment)  Discharge Needs  Concerns to be addressed:  Discharge Planning Concerns Readmission within the last 30 days:  No Current discharge risk:  Dependent with Mobility Barriers to Discharge:  Continued Medical Work up   [employer], Veronia Beets, LCSW 07/01/2018, 4:39 PM

## 2018-07-01 NOTE — Evaluation (Signed)
Occupational Therapy Evaluation Patient Details Name: Vanessa Esparza MRN: [id number] DOB: 17-Oct-1940 Today's Date: 07/01/2018    History of Present Illness Pt is a 78 yo female diagnosed with R knee OA and is s/p elective R TKA.  PMH includes: seizures (last one 02/08/09), anemia, renal cell CA, DM, HTN, and hypothyroidism.     Clinical Impression   Pt seen for OT evaluation this date, POD#1 from above surgery. Pt was independent in all ADLs prior to surgery and is eager to return to PLOF with less pain and improved safety and independence. Pt currently requires minimal assist for LB dressing while in seated position due to pain and limited AROM of R knee. Pt/family instructed in polar care mgt, falls prevention strategies, home/routines modifications, DME/AE for LB bathing and dressing tasks, and compression stocking mgt. Pt would benefit from skilled OT services including additional instruction in dressing techniques with or without assistive devices for dressing and bathing skills to support recall and carryover prior to discharge and ultimately to maximize safety, independence, and minimize falls risk and caregiver burden. Do not currently anticipate any OT needs following this hospitalization.      Follow Up Recommendations  No OT follow up    Equipment Recommendations  None recommended by OT    Recommendations for Other Services       Precautions / Restrictions Precautions Precautions: Knee;Fall Restrictions Weight Bearing Restrictions: Yes RLE Weight Bearing: Weight bearing as tolerated      Mobility Bed Mobility Overal bed mobility: Needs Assistance Bed Mobility: Supine to Sit     Supine to sit: Supervision     General bed mobility comments: additional time/effort to perform but able to do without physical assist  Transfers Overall transfer level: Needs assistance Equipment used: Rolling walker (2 wheeled) Transfers: Sit to/from Stand Sit to Stand: From elevated  surface;Min guard         General transfer comment: initially unable to complete with bed in lowest position, when bed elevated, pt able to perform with CGA and verbal cues for hand/foot placement     Balance Overall balance assessment: Needs assistance Sitting-balance support: No upper extremity supported;Feet supported Sitting balance-Leahy Scale: Good     Standing balance support: No upper extremity supported;During functional activity Standing balance-Leahy Scale: Fair Standing balance comment: able to let go of RW to complete donning of undergarments over hips without LOB                           ADL either performed or assessed with clinical judgement   ADL Overall ADL's : Needs assistance/impaired                                       General ADL Comments: Min A for initiating LB dressing over feet and for LB bathing; CGA for transfers from elevated services for ADL tasks     Vision Baseline Vision/History: Wears glasses Wears Glasses: At all times Patient Visual Report: No change from baseline       Perception     Praxis      Pertinent Vitals/Pain Pain Assessment: 0-10 Pain Score: 4  Pain Location: R knee with mobility, 0/10 at rest Pain Descriptors / Indicators: Aching;Sore Pain Intervention(s): Limited activity within patient's tolerance;Monitored during session;Repositioned;Ice applied;Premedicated before session     Hand Dominance Right   Extremity/Trunk Assessment Upper Extremity  Assessment Upper Extremity Assessment: Overall WFL for tasks assessed   Lower Extremity Assessment Lower Extremity Assessment: Generalized weakness;RLE deficits/detail RLE Deficits / Details: R hip flex strength >/= 3/5 with pt able to perform Ind SLRs without extensor lag; B ankle DF/PF strong against manual resistance with full AROM RLE Sensation: WNL   Cervical / Trunk Assessment Cervical / Trunk Assessment: Normal   Communication  Communication Communication: No difficulties   Cognition Arousal/Alertness: Awake/alert Behavior During Therapy: WFL for tasks assessed/performed Overall Cognitive Status: Within Functional Limits for tasks assessed                                     General Comments       Exercises Other Exercises Other Exercises: pt/family educated in polar care mgt and compression stocking mgt Other Exercises: pt instructed in home/routines modifications, AE/DME for self care tasks, and falls prevention strategies   Shoulder Instructions      Home Living Family/patient expects to be discharged to:: Private residence Living Arrangements: Spouse/significant other Available Help at Discharge: Family;Available 24 hours/day;Other (Comment)(Pt's spouse has had multiple orthopedic surgeries and would not be able to provide much physical assistance, children all out of town.) Type of Home: House Home Access: Stairs to enter [employer] of Steps: 2 Entrance Stairs-Rails: Right Home Layout: One level     Bathroom Shower/Tub: Teacher, early years/pre: Handicapped height     [place]: Grab bars - toilet;Other (comment);Walker - 2 wheels;Bedside commode;Shower seat;Grab bars - tub/shower   Additional Comments: Toilet is elevated height with grab bar and arm rests; pt has a shower seat in storage in the home      Prior Functioning/Environment Level of Independence: Independent        Comments: Pt ind with amb community distances without an AD, no fall history, Ind with ADLs        OT Problem List: Decreased strength;Decreased range of motion;Impaired balance (sitting and/or standing);Pain;Decreased knowledge of use of DME or AE      OT Treatment/Interventions: Self-care/ADL training;Balance training;Therapeutic exercise;Therapeutic activities;DME and/or AE instruction;Patient/family education    OT Goals(Current goals can be found in the care plan  section) Acute Rehab OT Goals Patient Stated Goal: To walk again OT Goal Formulation: With patient/family Time For Goal Achievement: 07/15/18 Potential to Achieve Goals: Good ADL Goals Pt Will Perform Lower Body Dressing: with supervision;sit to/from stand;with adaptive equipment(AE as needed, LRAD for in standing) Pt Will Transfer to Toilet: with supervision;ambulating(LRAD for amb, comfort height toilet w/ rails) Additional ADL Goal #1: Pt will independently instruct family in compression stocking mgt. Additional ADL Goal #2: Pt will independently instruct family in polar care mgt.  OT Frequency: Min 1X/week   Barriers to D/C:            Co-evaluation              AM-PAC OT "6 Clicks" Daily Activity     Outcome Measure Help from another person eating meals?: None Help from another person taking care of personal grooming?: None Help from another person toileting, which includes using toliet, bedpan, or urinal?: A Little Help from another person bathing (including washing, rinsing, drying)?: A Little Help from another person to put on and taking off regular upper body clothing?: None Help from another person to put on and taking off regular lower body clothing?: A Little 6 Click Score: 21  End of Session Equipment Utilized During Treatment: Gait belt;Rolling walker  Activity Tolerance: Patient tolerated treatment well Patient left: in chair;with call bell/phone within reach;with SCD's reapplied;Other (comment)(polar care in place, rolled towel under R ankle)  OT Visit Diagnosis: Other abnormalities of gait and mobility (R26.89);Muscle weakness (generalized) (M62.81);Pain Pain - Right/Left: Right Pain - part of body: Knee                Time: 1484-0397 OT Time Calculation (min): 34 min Charges:  OT General Charges $OT Visit: 1 Visit OT Evaluation $OT Eval Low Complexity: 1 Low OT Treatments $Self Care/Home Management : 23-37 mins  Jeni Salles, MPH, MS,  OTR/L ascom [phone] 07/01/18, 10:02 AM

## 2018-07-01 NOTE — Progress Notes (Signed)
   Subjective: 1 Day Post-Op Procedure(s) (LRB): TOTAL KNEE ARTHROPLASTY-RIGHT (Right) Patient reports pain as mild.   Patient is well, and has had no acute complaints or problems Denies any CP, SOB, ABD pain. We will continue therapy today.  Plan is to go Home after hospital stay.  Objective: Vital signs in last 24 hours: Temp:  [97.4 F (36.3 C)-98.2 F (36.8 C)] 98.1 F (36.7 C) (02/19 0745) Pulse Rate:  [59-84] 70 (02/19 0745) Resp:  [14-18] 18 (02/19 0745) BP: (114-169)/(58-84) 133/59 (02/19 0745) SpO2:  [95 %-99 %] 95 % (02/19 0745)  Intake/Output from previous day: 02/18 0701 - 02/19 0700 In: 311.1 [I.V.:211.1; IV Piggyback:100] Out: 1225 [XNATF:5732; Blood:50] Intake/Output this shift: No intake/output data recorded.  Recent Labs    06/30/18 0632 07/01/18 0359  HGB 12.0 8.5*   Recent Labs    06/30/18 0632 07/01/18 0359  WBC 6.8 4.9  RBC 4.91 3.47*  HCT 38.5 26.7*  PLT 431* 272   Recent Labs    06/30/18 0632 07/01/18 0359  NA  --  135  K  --  3.5  CL  --  98  CO2  --  29  BUN  --  13  CREATININE 0.82 0.80  GLUCOSE  --  108*  CALCIUM  --  7.6*   No results for input(s): LABPT, INR in the last 72 hours.  EXAM General - Patient is Alert, Appropriate and Oriented Extremity - Neurovascular intact Sensation intact distally Intact pulses distally Dorsiflexion/Plantar flexion intact No cellulitis present Compartment soft Dressing - dressing C/D/I and no drainage, Praveena intact without drainage. Motor Function - intact, moving foot and toes well on exam.   Past Medical History:  Diagnosis Date  . Anemia   . Arthritis   . Cancer (HCC)    RENAL CELL  . Diabetes mellitus without complication ([place])   . GERD (gastroesophageal reflux disease)   . Gout   . Hyperlipidemia   . Hypertension   . Hypothyroidism   . Meningioma ([place])   . Psoriasis   . Seizure ([place])   . Seizures ([place])    LAST SEIZURE ON 02-08-09    Assessment/Plan:   1 Day  Post-Op Procedure(s) (LRB): TOTAL KNEE ARTHROPLASTY-RIGHT (Right) Active Problems:   S/P TKR (total knee replacement) using cement, right   Acute post op blood loss anemia   Estimated body mass index is 27.41 kg/m as calculated from the following:   Height as of this encounter: 5\' 10"  (1.778 m).   Weight as of this encounter: 86.6 kg. Advance diet Up with therapy  Acute post op blood loss anemia -hemoglobin 8.5.  Recheck labs in the morning.  Start iron supplementation. Vital signs are stable Pain well controlled Needs bowel movement Care management to assist with discharge.  Patient prefers to go home with home health PT  DVT Prophylaxis - Lovenox, TED hose and SCDs Weight-Bearing as tolerated to right leg   T. Rachelle Hora, PA-C [place] 07/01/2018, 7:58 AM

## 2018-07-01 NOTE — Clinical Social Work Placement (Signed)
   CLINICAL SOCIAL WORK PLACEMENT  NOTE  Date:  07/01/2018  Patient Details  Name: Vanessa Esparza MRN: [id number] Date of Birth: 1940/12/30  Clinical Social Work is seeking post-discharge placement for this patient at the [place] level of care (*CSW will initial, date and re-position this form in  chart as items are completed):  Yes   Patient/family provided with [place] Work Department's list of facilities offering this level of care within the geographic area requested by the patient (or if unable, by the patient's family).  Yes   Patient/family informed of their freedom to choose among providers that offer the needed level of care, that participate in Medicare, Medicaid or managed care program needed by the patient, have an available bed and are willing to accept the patient.  Yes   Patient/family informed of [place]'s ownership interest in [facility] and [facility], as well as of the fact that they are under no obligation to receive care at these facilities.  PASRR submitted to EDS on 07/01/18     PASRR number received on 07/01/18     Existing PASRR number confirmed on       FL2 transmitted to all facilities in geographic area requested by pt/family on 07/01/18     FL2 transmitted to all facilities within larger geographic area on       Patient informed that his/her managed care company has contracts with or will negotiate with certain facilities, including the following:            Patient/family informed of bed offers received.  Patient chooses bed at       Physician recommends and patient chooses bed at      Patient to be transferred to   on  .  Patient to be transferred to facility by       Patient family notified on   of transfer.  Name of family member notified:        PHYSICIAN       Additional Comment:    _______________________________________________ Jhonnie Aliano, Veronia Beets, LCSW 07/01/2018, 4:38 PM

## 2018-07-01 NOTE — Progress Notes (Signed)
Physical Therapy Treatment Patient Details Name: Vanessa Esparza MRN: [id number] DOB: [birth year] Today's Date: 07/01/2018    History of Present Illness Pt is a 78 yo female diagnosed with R knee OA and is s/p elective R TKA.  PMH includes: seizures (last one 02/08/09), anemia, renal cell CA, DM, HTN, and hypothyroidism.      PT Comments    Pt presents with deficits in strength, transfers, mobility, gait, R knee ROM, and activity tolerance and is making very slow progress towards goals.  Pt required physical assistance with bed mobility tasks and required the EOB to be elevated in order to stand without assistance.  Pt was only able to amb 20' with a RW and CGA before requiring to return to sitting secondary to pain and fatigue.  Pt's ambulated with slow, effortful steps with an antalgic, step-to pattern and cues for proper sequencing.  Pt is not progressing as quickly as was anticipated and would not be safe to return to her prior living situation at this time.  Pt will benefit from PT services in a SNF setting upon discharge to safely address above deficits for decreased caregiver assistance and eventual return to PLOF.     Follow Up Recommendations  SNF     Equipment Recommendations  None recommended by PT    Recommendations for Other Services       Precautions / Restrictions Precautions Precautions: Knee;Fall Restrictions Weight Bearing Restrictions: Yes RLE Weight Bearing: Weight bearing as tolerated    Mobility  Bed Mobility Overal bed mobility: Needs Assistance         Sit to supine: Min assist   General bed mobility comments: Min A during sit to sup for RLE into bed  Transfers Overall transfer level: Needs assistance Equipment used: Rolling walker (2 wheeled) Transfers: Sit to/from Stand Sit to Stand: Min assist         General transfer comment: Min A and cues for sequencing to stand from the recliner  Ambulation/Gait Ambulation/Gait assistance: Min  guard Gait Distance (Feet): 20 Feet Assistive device: Rolling walker (2 wheeled) Gait Pattern/deviations: Step-to pattern;Antalgic;Decreased step length - left;Decreased stance time - right Gait velocity: decreased   General Gait Details: Antalgic, step-to pattern with slow, effortful cadence   Stairs             Wheelchair Mobility    Modified Rankin (Stroke Patients Only)       Balance Overall balance assessment: Needs assistance Sitting-balance support: No upper extremity supported;Feet supported Sitting balance-Leahy Scale: Good     Standing balance support: During functional activity;Bilateral upper extremity supported Standing balance-Leahy Scale: Good                              Cognition Arousal/Alertness: Awake/alert Behavior During Therapy: WFL for tasks assessed/performed Overall Cognitive Status: Within Functional Limits for tasks assessed                                        Exercises Total Joint Exercises Ankle Circles/Pumps: AROM;Both;10 reps;Strengthening Quad Sets: AROM;Strengthening;Right;5 reps;10 reps Gluteal Sets: Strengthening;Both;10 reps Heel Slides: AROM;Left;10 reps Hip ABduction/ADduction: AROM;AAROM;Both;10 reps Straight Leg Raises: AROM;AAROM;Both;10 reps Long Arc Quad: AROM;Right;10 reps;15 reps Knee Flexion: AROM;Right;10 reps;15 reps Goniometric ROM: R knee AROM: 12-70 deg, limited by pain Marching in Standing: AROM;Both;Standing;10 reps Other Exercises Other Exercises: Pt education/review on proper  positioning to encourage R knee ext PROM Other Exercises: HEP education/review per handout    General Comments        Pertinent Vitals/Pain Pain Assessment: 0-10 Pain Score: 7  Pain Location: R knee Pain Descriptors / Indicators: Aching;Sore Pain Intervention(s): RN gave pain meds during session;Monitored during session;Limited activity within patient's tolerance    Home Living                       Prior Function            PT Goals (current goals can now be found in the care plan section) Progress towards PT goals: Progressing toward goals    Frequency    BID      PT Plan Discharge plan needs to be updated    Co-evaluation              AM-PAC PT "6 Clicks" Mobility   Outcome Measure  Help needed turning from your back to your side while in a flat bed without using bedrails?: A Little Help needed moving from lying on your back to sitting on the side of a flat bed without using bedrails?: A Little Help needed moving to and from a bed to a chair (including a wheelchair)?: A Little Help needed standing up from a chair using your arms (e.g., wheelchair or bedside chair)?: A Little Help needed to walk in hospital room?: A Little Help needed climbing 3-5 steps with a railing? : A Lot 6 Click Score: 17    End of Session Equipment Utilized During Treatment: Gait belt Activity Tolerance: Patient limited by pain Patient left: in bed;with SCD's reapplied;with call bell/phone within reach;with bed alarm set;with family/visitor present;Other (comment)(Polar care to R knee; pt declined up in chair) Nurse Communication: Mobility status;Patient requests pain meds;Other (comment)(Pt c/o nausea, requested nausea meds) PT Visit Diagnosis: Muscle weakness (generalized) (M62.81);Other abnormalities of gait and mobility (R26.89)     Time: 1219-7588 PT Time Calculation (min) (ACUTE ONLY): 38 min  Charges:  $Gait Training: 8-22 mins $Therapeutic Exercise: 8-22 mins $Therapeutic Activity: 8-22 mins                     D. Scott Trula Frede PT, DPT 07/01/18, 3:01 PM

## 2018-07-01 NOTE — Care Management Note (Signed)
Case Management Note  Patient Details  Name: Vanessa Esparza MRN: [id number] Date of Birth: 1940-07-01  Subjective/Objective:                  Met with the patient to discuss DC plan and needs The patient has a RW and BSC at home Lives with family [facility] list provided to the patient per CMS.gov [facility] PCP is [employer] is [facility] on [address] check the price of Lovenox before DC I explained the the patient that I will get the price before she leaves Pateitn drives herself but husband will provide the back up transportation   Action/Plan: [facility] list provided to the patient and she has chosen [facility], notified [employer], retail at [facility]    Expected Discharge Date:                  Expected Discharge Plan:     In-House Referral:     Discharge planning Services  CM Consult  Post Acute Care Choice:  Home Health Choice offered to:     DME Arranged:    DME Agency:     HH Arranged:  PT [place]:  [place]  Status of Service:  In process, will continue to follow  If discussed at Long Length of Stay Meetings, dates discussed:    Additional Comments:  Su Hilt, RN 07/01/2018, 9:50 AM

## 2018-07-01 NOTE — Anesthesia Postprocedure Evaluation (Signed)
Anesthesia Post Note  Patient: Vanessa Esparza  Procedure(s) Performed: TOTAL KNEE ARTHROPLASTY-RIGHT (Right )  Patient location during evaluation: Nursing Unit Anesthesia Type: Spinal Level of consciousness: awake, awake and alert and oriented Pain management: pain level controlled Vital Signs Assessment: post-procedure vital signs reviewed and stable Respiratory status: spontaneous breathing, nonlabored ventilation and respiratory function stable Cardiovascular status: blood pressure returned to baseline and stable Postop Assessment: no headache and no backache Anesthetic complications: no     Last Vitals:  Vitals:   06/30/18 2005 07/01/18 0451  BP: 135/68 (!) 114/58  Pulse: 72 66  Resp: 17 14  Temp: 36.6 C 36.6 C  SpO2: 99% 98%    Last Pain:  Vitals:   07/01/18 0649  TempSrc:   PainSc: 4                  [employer]

## 2018-07-01 NOTE — Progress Notes (Signed)
Physical Therapy Treatment Patient Details Name: Vanessa Esparza MRN: [id number] DOB: 03-14-1941 Today's Date: 07/01/2018    History of Present Illness Pt is a 78 yo female diagnosed with R knee OA and is s/p elective R TKA.  PMH includes: seizures (last one 02/08/09), anemia, renal cell CA, DM, HTN, and hypothyroidism.      PT Comments    Pt presents with deficits in strength, transfers, mobility, gait, balance, R knee ROM, and activity tolerance.  Pt required min A with sit to sup for RLE management.  Pt required min A to stand from the recliner with min verbal cues for hand placement and LE positioning.  Pt was able to amb 30' with a slow, effortful cadence with antalgic step-to pattern.  Will continue to recommend HHPT at discharge but pt will need to make considerable progress towards goals prior to discharge secondary to having 2 steps to enter the home and limited physical assistance available at home.  Will continue to assess for possible recommendation change to SNF as appropriate pending pt progress.     Follow Up Recommendations  Home health PT;Supervision for mobility/OOB     Equipment Recommendations  None recommended by PT    Recommendations for Other Services       Precautions / Restrictions Precautions Precautions: Knee;Fall Restrictions Weight Bearing Restrictions: Yes RLE Weight Bearing: Weight bearing as tolerated    Mobility  Bed Mobility Overal bed mobility: Needs Assistance Bed Mobility: Supine to Sit     Supine to sit: Supervision Sit to supine: Min assist   General bed mobility comments: Min A during sit to sup for RLE into bed  Transfers Overall transfer level: Needs assistance Equipment used: Rolling walker (2 wheeled) Transfers: Sit to/from Stand Sit to Stand: Min assist         General transfer comment: Min A and cues for sequencing to stand from the recliner  Ambulation/Gait Ambulation/Gait assistance: Min guard Gait Distance (Feet):  30 Feet Assistive device: Rolling walker (2 wheeled) Gait Pattern/deviations: Step-to pattern;Antalgic;Decreased step length - left;Decreased stance time - right Gait velocity: decreased   General Gait Details: Antalgic, step-to pattern with slow, effortful cadence   Stairs             Wheelchair Mobility    Modified Rankin (Stroke Patients Only)       Balance Overall balance assessment: Needs assistance Sitting-balance support: No upper extremity supported;Feet supported Sitting balance-Leahy Scale: Good     Standing balance support: During functional activity;Bilateral upper extremity supported Standing balance-Leahy Scale: Good Standing balance comment: able to let go of RW to complete donning of undergarments over hips without LOB                            Cognition Arousal/Alertness: Awake/alert Behavior During Therapy: WFL for tasks assessed/performed Overall Cognitive Status: Within Functional Limits for tasks assessed                                        Exercises Total Joint Exercises Ankle Circles/Pumps: AROM;Both;10 reps;Strengthening Quad Sets: AROM;Strengthening;Right;5 reps;10 reps Gluteal Sets: Strengthening;Both;10 reps Straight Leg Raises: AROM;AAROM;10 reps;Right Long Arc Quad: AROM;Strengthening;Right;10 reps;15 reps Knee Flexion: AROM;Strengthening;Right;10 reps;15 reps Goniometric ROM: R knee AROM: 12-70 deg, limited by pain Marching in Standing: AROM;Both;Standing;10 reps Other Exercises Other Exercises: Pt educated on 90 deg right turns to  prevent CKC twisting on the R knee Other Exercises: pt/family educated in polar care mgt and compression stocking mgt Other Exercises: pt instructed in home/routines modifications, AE/DME for self care tasks, and falls prevention strategies    General Comments        Pertinent Vitals/Pain Pain Assessment: 0-10 Pain Score: 5  Pain Location: R knee Pain Descriptors /  Indicators: Aching;Sore Pain Intervention(s): Monitored during session;Premedicated before session;Limited activity within patient's tolerance    Home Living Family/patient expects to be discharged to:: Private residence Living Arrangements: Spouse/significant other Available Help at Discharge: Family;Available 24 hours/day;Other (Comment)(Pt's spouse has had multiple orthopedic surgeries and would not be able to provide much physical assistance, children all out of town.) Type of Home: House Home Access: Stairs to enter Entrance Stairs-Rails: Right Home Layout: One level Home Equipment: Grab bars - toilet;Other (comment);Walker - 2 wheels;Bedside commode;Shower seat;Grab bars - tub/shower Additional Comments: Toilet is elevated height with grab bar and arm rests; pt has a shower seat in storage in the home    Prior Function Level of Independence: Independent      Comments: Pt ind with amb community distances without an AD, no fall history, Ind with ADLs   PT Goals (current goals can now be found in the care plan section) Acute Rehab PT Goals Patient Stated Goal: To walk again Progress towards PT goals: Progressing toward goals    Frequency    BID      PT Plan Current plan remains appropriate    Co-evaluation              AM-PAC PT "6 Clicks" Mobility   Outcome Measure  Help needed turning from your back to your side while in a flat bed without using bedrails?: A Little Help needed moving from lying on your back to sitting on the side of a flat bed without using bedrails?: A Little Help needed moving to and from a bed to a chair (including a wheelchair)?: A Little Help needed standing up from a chair using your arms (e.g., wheelchair or bedside chair)?: A Little Help needed to walk in hospital room?: A Little Help needed climbing 3-5 steps with a railing? : A Lot 6 Click Score: 17    End of Session Equipment Utilized During Treatment: Gait belt Activity  Tolerance: Patient limited by pain Patient left: in bed;with SCD's reapplied;with call bell/phone within reach;with bed alarm set;with family/visitor present;Other (comment)(Polar care to R knee, pt declined up in chair) Nurse Communication: Mobility status PT Visit Diagnosis: Muscle weakness (generalized) (M62.81);Other abnormalities of gait and mobility (R26.89)     Time: 1014-1040 PT Time Calculation (min) (ACUTE ONLY): 26 min  Charges:  $Gait Training: 8-22 mins $Therapeutic Exercise: 8-22 mins                     D. Scott Nimrit Kehres PT, DPT 07/01/18, 11:54 AM

## 2018-07-02 LAB — CBC
HCT: 26.2 % — ABNORMAL LOW (ref 36.0–46.0)
Hemoglobin: 8.3 g/dL — ABNORMAL LOW (ref 12.0–15.0)
MCH: 24.5 pg — ABNORMAL LOW (ref 26.0–34.0)
MCHC: 31.7 g/dL (ref 30.0–36.0)
MCV: 77.3 fL — ABNORMAL LOW (ref 80.0–100.0)
Platelets: 259 10*3/uL (ref 150–400)
RBC: 3.39 MIL/uL — ABNORMAL LOW (ref 3.87–5.11)
RDW: 13.6 % (ref 11.5–15.5)
WBC: 6.2 10*3/uL (ref 4.0–10.5)
nRBC: 0 % (ref 0.0–0.2)

## 2018-07-02 LAB — GLUCOSE, CAPILLARY
Glucose-Capillary: 103 mg/dL — ABNORMAL HIGH (ref 70–99)
Glucose-Capillary: 110 mg/dL — ABNORMAL HIGH (ref 70–99)
Glucose-Capillary: 134 mg/dL — ABNORMAL HIGH (ref 70–99)
Glucose-Capillary: 118 mg/dL — ABNORMAL HIGH (ref 70–99)

## 2018-07-02 LAB — BASIC METABOLIC PANEL
Anion gap: 7 (ref 5–15)
BUN: 11 mg/dL (ref 8–23)
CHLORIDE: 97 mmol/L — AB (ref 98–111)
CO2: 29 mmol/L (ref 22–32)
CREATININE: 0.64 mg/dL (ref 0.44–1.00)
Calcium: 7.7 mg/dL — ABNORMAL LOW (ref 8.9–10.3)
GFR calc Af Amer: >60 mL/min (ref >60)
GFR calc non Af Amer: >60 mL/min (ref >60)
Glucose, Bld: 74 mg/dL (ref 70–99)
Potassium: 3.1 mmol/L — ABNORMAL LOW (ref 3.5–5.1)
Sodium: 133 mmol/L — ABNORMAL LOW (ref 135–145)

## 2018-07-02 MED ORDER — ENOXAPARIN SODIUM 40 MG/0.4ML ~~LOC~~ SOLN: 40.0000 mg | SUBCUTANEOUS | 0 refills | Status: DC

## 2018-07-02 MED ORDER — POTASSIUM CHLORIDE 20 MEQ PO PACK
20.0000 meq | PACK | Freq: Three times a day (TID) | ORAL | Status: AC
  Administered 2018-07-02 (×2): 20 meq via ORAL
  Filled 2018-07-02 (×3): qty 1

## 2018-07-02 MED ORDER — BISACODYL 10 MG RE SUPP
10.0000 mg | Freq: Once | RECTAL | Status: AC
  Administered 2018-07-02: 10 mg via RECTAL
  Filled 2018-07-02: qty 1

## 2018-07-02 MED ORDER — POTASSIUM CHLORIDE CRYS ER 20 MEQ PO TBCR
20.0000 meq | EXTENDED_RELEASE_TABLET | Freq: Two times a day (BID) | ORAL | Status: DC
  Administered 2018-07-02 – 2018-07-03 (×2): 20 meq via ORAL
  Filled 2018-07-02 (×2): qty 1

## 2018-07-02 MED ORDER — BISACODYL 5 MG PO TBEC: 5.0000 mg | DELAYED_RELEASE_TABLET | Freq: Every day | ORAL | 0 refills | Status: DC | PRN

## 2018-07-02 MED ORDER — HYDROCODONE-ACETAMINOPHEN 5-325 MG PO TABS: 1.0000 | ORAL_TABLET | ORAL | 0 refills | Status: DC | PRN

## 2018-07-02 NOTE — Care Management (Signed)
Called [facility] at [phone] to check the price of Lovenox  $8.50, notified the patient of the cost

## 2018-07-02 NOTE — Discharge Summary (Addendum)
Physician Discharge Summary  Patient ID: Vanessa Esparza MRN: [id number] DOB/AGE: 1940/11/05 78 y.o.  Admit date: 06/30/2018 Discharge date: 07/03/2018 Admission Diagnoses:  CHRONIC PAIN RIGHT KNEE   Discharge Diagnoses: Patient Active Problem List   Diagnosis Date Noted  . S/P TKR (total knee replacement) using cement, right 06/30/2018  . 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 Personal history of renal cell carcinoma 09/30/2016  . Anemia 08/16/2016  . Diabetes mellitus type 2, uncomplicated ([place]) 94/49/6759  . Hyperlipidemia, unspecified 08/16/2016  . Hypertension 08/16/2016  . Thyroid disease 08/16/2016  . Acquired cyst of kidney 04/17/2012  . Mixed urge and stress incontinence 04/17/2012  . Renal colic 16/38/4665  . Malignant neoplasm of kidney ([place]) 03/28/2011  . Benign neoplasm of brain ([place]) 07/04/2008  . Esophageal reflux 07/04/2008  . Hypercholesterolemia 07/04/2008  . Hypertension, benign 07/04/2008  . Hypothyroidism (acquired) 07/04/2008    Past Medical History:  Diagnosis Date  . Anemia   . Arthritis   . Cancer (HCC)    RENAL CELL  . Diabetes mellitus without complication ([place])   . GERD (gastroesophageal reflux disease)   . Gout   . Hyperlipidemia   . Hypertension   . Hypothyroidism   . Meningioma ([place])   . Psoriasis   . Seizure ([place])   . Seizures ([place])    LAST SEIZURE ON 02-08-09     Transfusion: none   Consultants (if any):   Discharged Condition: Improved  Hospital Course: Vanessa Esparza is an 78 y.o. female who was admitted 06/30/2018 with a diagnosis of right knee osteoarthritis and went to the operating room on 06/30/2018 and underwent the above named procedures.    Surgeries: Procedure(s): TOTAL KNEE ARTHROPLASTY-RIGHT on 06/30/2018 Patient tolerated the surgery well. Taken to PACU where she was stabilized and then transferred to the orthopedic floor.  Started on Lovenox 30 mg q 24 hrs. Foot  pumps applied bilaterally at 80 mm. Heels elevated on bed with rolled towels. No evidence of DVT. Negative [place]. Physical therapy started on day #1 for gait training and transfer. OT started day #1 for ADL and assisted devices.  Patient's foley was d/c on day #1. Patient's IV  was d/c on day #2.  On post op day #3 patient was stable and ready for discharge to SNF  Implants: Medacta GM K sphere 4 right femur 3TI4 tibial baseplate with short stem and 10 mm insert, 3 patella all components cemented  She was given perioperative antibiotics:  Anti-infectives (From admission, onward)   Start     Dose/Rate Route Frequency Ordered Stop   06/30/18 1400  ceFAZolin (ANCEF) IVPB 2g/100 mL premix     2 g 200 mL/hr over 30 Minutes Intravenous Every 6 hours 06/30/18 1047 06/30/18 2200   06/30/18 0612  ceFAZolin (ANCEF) 2-4 GM/100ML-% IVPB    Note to Pharmacy:  Register, Karen   : cabinet override      06/30/18 0612 06/30/18 0739   06/29/18 2215  ceFAZolin (ANCEF) IVPB 2g/100 mL premix     2 g 200 mL/hr over 30 Minutes Intravenous  Once 06/29/18 2210 06/30/18 0739    .  She was given sequential compression devices, early ambulation, and Lovenox, teds for DVT prophylaxis.  She benefited maximally from the hospital stay and there were no complications.    Recent vital signs:  Vitals:   07/01/18 2324 07/02/18 0744  BP: (!) 123/57 (!) 121/55  Pulse: 84 81  Resp: 16  Temp: 97.9 F (36.6 C) 99.3 F (37.4 C)  SpO2: 95% 94%    Recent laboratory studies:  Lab Results  Component Value Date   HGB 8.3 (L) 07/02/2018   HGB 8.5 (L) 07/01/2018   HGB 12.0 06/30/2018   Lab Results  Component Value Date   WBC 6.2 07/02/2018   PLT 259 07/02/2018   Lab Results  Component Value Date   INR 1.02 06/17/2018   Lab Results  Component Value Date   NA 133 (L) 07/02/2018   K 3.1 (L) 07/02/2018   CL 97 (L) 07/02/2018   CO2 29 07/02/2018   BUN 11 07/02/2018   CREATININE 0.64 07/02/2018   GLUCOSE  74 07/02/2018    Discharge Medications:   Allergies as of 07/02/2018      Reactions   Arava [leflunomide]    Swelling tongue and ulcers in mouth    Methotrexate Derivatives Swelling   Tongue swelling   Statins Other (See Comments)   BLOATED FEELING, CHEST FULLNESS      Medication List    STOP taking these medications   ibuprofen 200 MG tablet Commonly known as:  ADVIL,MOTRIN   naproxen sodium 220 MG tablet Commonly known as:  ALEVE     TAKE these medications   allopurinol 100 MG tablet Commonly known as:  ZYLOPRIM Take 100 mg by mouth daily.   bisacodyl 5 MG EC tablet Commonly known as:  DULCOLAX Take 1 tablet (5 mg total) by mouth daily as needed for moderate constipation.   calcium carbonate 600 MG Tabs tablet Commonly known as:  OS-CAL Take 600 mg by mouth daily.   colchicine 0.6 MG tablet Take 0.6 mg by mouth daily as needed (gout flare).   enoxaparin 40 MG/0.4ML injection Commonly known as:  LOVENOX Inject 0.4 mLs (40 mg total) into the skin daily for 14 days.   fexofenadine 180 MG tablet Commonly known as:  ALLEGRA Take 180 mg by mouth daily as needed for allergies or rhinitis.   folic acid 681 MCG tablet Commonly known as:  FOLVITE Take 800 mcg by mouth daily as needed (upset stomach).   glipiZIDE 5 MG tablet Commonly known as:  GLUCOTROL Take 5 mg by mouth daily before breakfast.   glucose blood test strip USE 1 STRIP TO CHECK GLUCOSE TWICE DAILY   HYDROcodone-acetaminophen 5-325 MG tablet Commonly known as:  NORCO/VICODIN Take 1-2 tablets by mouth every 4 (four) hours as needed for moderate pain (pain score 4-6).   levETIRAcetam 500 MG tablet Commonly known as:  KEPPRA Take 500 mg by mouth 2 (two) times daily.   levothyroxine 150 MCG tablet Commonly known as:  SYNTHROID, LEVOTHROID Take 150 mcg by mouth daily before breakfast.   lisinopril-hydrochlorothiazide 20-25 MG tablet Commonly known as:  PRINZIDE,ZESTORETIC Take 1 tablet by mouth  daily.   omeprazole 40 MG capsule Commonly known as:  PRILOSEC Take 40 mg by mouth daily.            Durable Medical Equipment  (From admission, onward)         Start     Ordered   06/30/18 1048  DME Walker rolling  Once    Question:  Patient needs a walker to treat with the following condition  Answer:  S/P TKR (total knee replacement) using cement, right   06/30/18 1047   06/30/18 1048  DME 3 n 1  Once     06/30/18 1047   06/30/18 1048  DME Bedside commode  Once  Question:  Patient needs a bedside commode to treat with the following condition  Answer:  S/P TKR (total knee replacement) using cement, right   06/30/18 1047          Diagnostic Studies: Dg Knee 1-2 Views Right  Result Date: 06/30/2018 CLINICAL DATA:  Post right knee replacement EXAM: RIGHT KNEE - 1-2 VIEW COMPARISON:  MRI 08/30/2015 FINDINGS: Interval knee arthroplasty. Alignment is normal. Expected postoperative joint space gas and soft tissue swelling. Surgical clips overlie the anterior knee. IMPRESSION: Interval right knee arthroplasty without complication. Electronically Signed   By: Nolon Nations M.D.   On: 06/30/2018 10:31    Disposition:     Follow-up Information    Duanne Guess, PA-C Follow up in 2 week(s).   Specialties:  Orthopedic Surgery, Emergency Medicine Contact information: [place] [address] [phone]            Signed: Feliberto Gottron 07/02/2018, 8:28 AM

## 2018-07-02 NOTE — Progress Notes (Signed)
   Subjective: 2 Days Post-Op Procedure(s) (LRB): TOTAL KNEE ARTHROPLASTY-RIGHT (Right) Patient reports pain as mild.   Patient is well, and has had no acute complaints or problems Denies any CP, SOB, ABD pain. We will continue therapy today.  Plan is to go Home after hospital stay.  Objective: Vital signs in last 24 hours: Temp:  [97.9 F (36.6 C)-99.3 F (37.4 C)] 99.3 F (37.4 C) (02/20 0744) Pulse Rate:  [81-84] 81 (02/20 0744) Resp:  [16] 16 (02/19 2324) BP: (121-148)/(55-65) 121/55 (02/20 0744) SpO2:  [94 %-95 %] 94 % (02/20 0744)  Intake/Output from previous day: 02/19 0701 - 02/20 0700 In: 0  Out: 275 [Urine:275] Intake/Output this shift: No intake/output data recorded.  Recent Labs    06/30/18 0632 07/01/18 0359 07/02/18 0343  HGB 12.0 8.5* 8.3*   Recent Labs    07/01/18 0359 07/02/18 0343  WBC 4.9 6.2  RBC 3.47* 3.39*  HCT 26.7* 26.2*  PLT 272 259   Recent Labs    07/01/18 0359 07/02/18 0343  NA 135 133*  K 3.5 3.1*  CL 98 97*  CO2 29 29  BUN 13 11  CREATININE 0.80 0.64  GLUCOSE 108* 74  CALCIUM 7.6* 7.7*   No results for input(s): LABPT, INR in the last 72 hours.  EXAM General - Patient is Alert, Appropriate and Oriented Extremity - Neurovascular intact Sensation intact distally Intact pulses distally Dorsiflexion/Plantar flexion intact No cellulitis present Compartment soft Dressing - dressing C/D/I and no drainage, Praveena intact without drainage. Motor Function - intact, moving foot and toes well on exam.   Past Medical History:  Diagnosis Date  . Anemia   . Arthritis   . Cancer (HCC)    RENAL CELL  . Diabetes mellitus without complication ([place])   . GERD (gastroesophageal reflux disease)   . Gout   . Hyperlipidemia   . Hypertension   . Hypothyroidism   . Meningioma ([place])   . Psoriasis   . Seizure ([place])   . Seizures ([place])    LAST SEIZURE ON 02-08-09    Assessment/Plan:   2 Days Post-Op Procedure(s) (LRB): TOTAL  KNEE ARTHROPLASTY-RIGHT (Right) Active Problems:   S/P TKR (total knee replacement) using cement, right   Acute post op blood loss anemia    Hypokalemia Estimated body mass index is 27.41 kg/m as calculated from the following:   Height as of this encounter: 5\' 10"  (1.778 m).   Weight as of this encounter: 86.6 kg. Advance diet Up with therapy  Acute post op blood loss anemia -hemoglobin 8.3.  Stable.  Continue iron supplementation. Hypokalemia-start oral potassium supplement Vital signs are stable Pain well controlled Needs bowel movement Care management to assist with discharge to home with home health PT  DVT Prophylaxis - Lovenox, TED hose and SCDs Weight-Bearing as tolerated to right leg   T. Rachelle Hora, PA-C [place] 07/02/2018, 8:22 AM

## 2018-07-02 NOTE — Discharge Instructions (Signed)

## 2018-07-02 NOTE — Progress Notes (Signed)
Physical Therapy Treatment Patient Details Name: Vanessa Esparza MRN: [id number] DOB: 1941-01-06 Today's Date: 07/02/2018    History of Present Illness Pt is a 78 yo female diagnosed with R knee OA and is s/p elective R TKA.  PMH includes: seizures (last one 02/08/09), anemia, renal cell CA, DM, HTN, and hypothyroidism.      PT Comments    Pt presents with deficits in strength, transfers, mobility, gait, balance, R knee ROM, and activity tolerance.  Pt was able to go sup to sit with extra time and effort but not require physical assistance.  Pt was CGA with transfers from an elevated surface with cues for sequencing.  Pt was able to amb 10 feet with very slow cadence and with cues for sequencing but was steady this session without LOB.  Pt will benefit from PT services in a SNF setting upon discharge to safely address above deficits for decreased caregiver assistance and eventual return to PLOF.     Follow Up Recommendations  SNF     Equipment Recommendations  None recommended by PT    Recommendations for Other Services       Precautions / Restrictions Precautions Precautions: Knee;Fall Restrictions Weight Bearing Restrictions: Yes RLE Weight Bearing: Weight bearing as tolerated    Mobility  Bed Mobility Overal bed mobility: Needs Assistance Bed Mobility: Supine to Sit     Supine to sit: Supervision     General bed mobility comments: Extra time and effort with sup to sit but no physical assistance needed  Transfers Overall transfer level: Needs assistance Equipment used: Rolling walker (2 wheeled) Transfers: Sit to/from Stand Sit to Stand: Min guard;From elevated surface         General transfer comment: Extra time and effort with transfers but no physical assistance needed  Ambulation/Gait Ambulation/Gait assistance: Min guard Gait Distance (Feet): 10 Feet Assistive device: Rolling walker (2 wheeled) Gait Pattern/deviations: Step-to pattern;Antalgic;Decreased  step length - left;Decreased stance time - right Gait velocity: decreased   General Gait Details: Antalgic, step-to pattern with slow, effortful cadence, but no LOB this session   Stairs             Wheelchair Mobility    Modified Rankin (Stroke Patients Only)       Balance Overall balance assessment: Needs assistance Sitting-balance support: No upper extremity supported;Feet supported Sitting balance-Leahy Scale: Good     Standing balance support: During functional activity;Bilateral upper extremity supported Standing balance-Leahy Scale: Fair                              Cognition Arousal/Alertness: Awake/alert Behavior During Therapy: WFL for tasks assessed/performed Overall Cognitive Status: Within Functional Limits for tasks assessed                                        Exercises Total Joint Exercises Ankle Circles/Pumps: AROM;Both;10 reps;Strengthening Quad Sets: AROM;Strengthening;Right;5 reps;10 reps Hip ABduction/ADduction: AROM;AAROM;Both;10 reps Straight Leg Raises: AROM;AAROM;Both;10 reps Long Arc Quad: AROM;Right;10 reps Knee Flexion: AROM;Right;10 reps Marching in Standing: AROM;Both;Standing;10 reps Other Exercises Other Exercises: Standing: mini squats x 5 with small amplitude, RLE SLR x 10, and RLE hip abd x 10    General Comments        Pertinent Vitals/Pain Pain Assessment: 0-10 Pain Score: 2  Pain Location: R knee Pain Descriptors / Indicators: Aching;Sore Pain Intervention(s):  Premedicated before session;Monitored during session    Home Living                      Prior Function            PT Goals (current goals can now be found in the care plan section) Progress towards PT goals: Progressing toward goals    Frequency    BID      PT Plan Current plan remains appropriate    Co-evaluation              AM-PAC PT "6 Clicks" Mobility   Outcome Measure  Help needed turning  from your back to your side while in a flat bed without using bedrails?: A Little Help needed moving from lying on your back to sitting on the side of a flat bed without using bedrails?: A Little Help needed moving to and from a bed to a chair (including a wheelchair)?: A Little Help needed standing up from a chair using your arms (e.g., wheelchair or bedside chair)?: A Little Help needed to walk in hospital room?: A Lot Help needed climbing 3-5 steps with a railing? : A Lot 6 Click Score: 16    End of Session Equipment Utilized During Treatment: Gait belt Activity Tolerance: Patient tolerated treatment well Patient left: in chair;with chair alarm set;with call bell/phone within reach;with family/visitor present;with SCD's reapplied;Other (comment)(Polar care to R knee) Nurse Communication: Mobility status PT Visit Diagnosis: Muscle weakness (generalized) (M62.81);Other abnormalities of gait and mobility (R26.89)     Time: 5885-0277 PT Time Calculation (min) (ACUTE ONLY): 26 min  Charges:  $Gait Training: 8-22 mins $Therapeutic Exercise: 8-22 mins                     D. Scott Julena Barbour PT, DPT 07/02/18, 4:04 PM

## 2018-07-02 NOTE — Care Management (Signed)
PT came to me and stated that the patient definitely needs Rehab, she did worse today that yesterday, paged Rachelle Hora to notify to change to Rehab from [facility]

## 2018-07-02 NOTE — Progress Notes (Signed)
Physical Therapy Treatment Patient Details Name: Vanessa Esparza MRN: [id number] DOB: 30-Jul-1940 Today's Date: 07/02/2018    History of Present Illness Pt is a 78 yo female diagnosed with R knee OA and is s/p elective R TKA.  PMH includes: seizures (last one 02/08/09), anemia, renal cell CA, DM, HTN, and hypothyroidism.      PT Comments    Pt presents with deficits in strength, transfers, mobility, gait, balance, R knee ROM, and activity tolerance.  Pt required assistance during sit to sup to get her RLE into bed and positioned properly.  Pt required min A to stand from an elevated surface along with cues for general sequencing.  Pt had difficulty advancing her LLE during amb and required min A on one occasion to correct LOB.  After Amb pt c/o onset of nausea with nursing notified.  Pt will benefit from PT services in a SNF setting upon discharge to safely address above deficits for decreased caregiver assistance and eventual return to PLOF.      Follow Up Recommendations  SNF     Equipment Recommendations  None recommended by PT    Recommendations for Other Services       Precautions / Restrictions Precautions Precautions: Knee;Fall Restrictions Weight Bearing Restrictions: Yes RLE Weight Bearing: Weight bearing as tolerated    Mobility  Bed Mobility Overal bed mobility: Needs Assistance Bed Mobility: Sit to Supine       Sit to supine: Min assist   General bed mobility comments: Min A during sit to sup for RLE into bed  Transfers Overall transfer level: Needs assistance Equipment used: Rolling walker (2 wheeled) Transfers: Sit to/from Stand Sit to Stand: Min assist;From elevated surface         General transfer comment: Min A and cues for sequencing to stand from the EOB  Ambulation/Gait Ambulation/Gait assistance: Min assist Gait Distance (Feet): 10 Feet Assistive device: Rolling walker (2 wheeled) Gait Pattern/deviations: Step-to pattern;Antalgic;Decreased  step length - left;Decreased stance time - right Gait velocity: decreased   General Gait Details: Antalgic, step-to pattern with slow, effortful cadence, difficulty advancing the LLE, and LOB x 1 requiring min A to correct   Stairs             Wheelchair Mobility    Modified Rankin (Stroke Patients Only)       Balance Overall balance assessment: Needs assistance Sitting-balance support: No upper extremity supported;Feet supported Sitting balance-Leahy Scale: Good     Standing balance support: During functional activity;Bilateral upper extremity supported Standing balance-Leahy Scale: Fair Standing balance comment: LOB x 1 during amb requiring min A to correct                            Cognition Arousal/Alertness: Awake/alert Behavior During Therapy: WFL for tasks assessed/performed Overall Cognitive Status: Within Functional Limits for tasks assessed                                        Exercises Total Joint Exercises Ankle Circles/Pumps: AROM;Both;10 reps;Strengthening Quad Sets: AROM;Strengthening;Right;5 reps;10 reps Hip ABduction/ADduction: AROM;AAROM;Both;10 reps Straight Leg Raises: AROM;AAROM;Both;10 reps Long Arc Quad: AROM;Right;10 reps;15 reps Knee Flexion: AROM;Right;10 reps;15 reps Goniometric ROM: R knee AROM: 12-75 deg Marching in Standing: AROM;Both;Standing;10 reps    General Comments        Pertinent Vitals/Pain Pain Assessment: 0-10 Pain Score: 3  Pain Location: R knee Pain Descriptors / Indicators: Aching;Sore Pain Intervention(s): Premedicated before session;Monitored during session    Home Living                      Prior Function            PT Goals (current goals can now be found in the care plan section)      Frequency    BID      PT Plan Current plan remains appropriate    Co-evaluation              AM-PAC PT "6 Clicks" Mobility   Outcome Measure  Help needed  turning from your back to your side while in a flat bed without using bedrails?: A Little Help needed moving from lying on your back to sitting on the side of a flat bed without using bedrails?: A Little Help needed moving to and from a bed to a chair (including a wheelchair)?: A Little Help needed standing up from a chair using your arms (e.g., wheelchair or bedside chair)?: A Little Help needed to walk in hospital room?: A Lot Help needed climbing 3-5 steps with a railing? : A Lot 6 Click Score: 16    End of Session Equipment Utilized During Treatment: Gait belt Activity Tolerance: Other (comment)(Pt ambulation limited by nausea) Patient left: in bed;with SCD's reapplied;with call bell/phone within reach;with bed alarm set;with family/visitor present;Other (comment)(Polar care donned to R knee) Nurse Communication: Mobility status;Other (comment)(Pt requesting nausea meds) PT Visit Diagnosis: Muscle weakness (generalized) (M62.81);Other abnormalities of gait and mobility (R26.89)     Time: 7939-0300 PT Time Calculation (min) (ACUTE ONLY): 25 min  Charges:  $Gait Training: 8-22 mins $Therapeutic Exercise: 8-22 mins                     D. Scott Mishel Sans PT, DPT 07/02/18, 11:20 AM

## 2018-07-02 NOTE — Progress Notes (Signed)
Clinical Education officer, museum (CSW) presented bed offers to patient and her husband Gene and son Fritz Pickerel. CSW discussed the quality measures of the facilities with the patient and her family. They chose [facility]. [facility] Medicare SNF authorization has been received through [facility], authorization # [id number]. Per [facility] liaison they will not have a bed for patient today however they will be able to accept patient tomorrow. Patient and her husband and son are aware of above. Ortho PA aware of above. CSW will continue to follow and assist as needed.   [employer], LCSW [phone]

## 2018-07-03 LAB — BASIC METABOLIC PANEL
Anion gap: 6 (ref 5–15)
BUN: 13 mg/dL (ref 8–23)
CHLORIDE: 100 mmol/L (ref 98–111)
CO2: 28 mmol/L (ref 22–32)
Calcium: 7.4 mg/dL — ABNORMAL LOW (ref 8.9–10.3)
Creatinine, Ser: 0.73 mg/dL (ref 0.44–1.00)
GFR calc Af Amer: >60 mL/min (ref >60)
GFR calc non Af Amer: >60 mL/min (ref >60)
GLUCOSE: 106 mg/dL — AB (ref 70–99)
Potassium: 3.5 mmol/L (ref 3.5–5.1)
Sodium: 134 mmol/L — ABNORMAL LOW (ref 135–145)

## 2018-07-03 LAB — GLUCOSE, CAPILLARY: Glucose-Capillary: 109 mg/dL — ABNORMAL HIGH (ref 70–99)

## 2018-07-03 MED ORDER — HYDROCODONE-ACETAMINOPHEN 5-325 MG PO TABS: 1.0000 | ORAL_TABLET | ORAL | 0 refills | Status: DC | PRN

## 2018-07-03 NOTE — Care Management Important Message (Signed)
Important Message  Patient Details  Name: Vanessa Esparza MRN: [id number] Date of Birth: May 14, 1940   Medicare Important Message Given:  Yes    Juliann Pulse A Ario Mcdiarmid 07/03/2018, 10:40 AM

## 2018-07-03 NOTE — Progress Notes (Signed)
Patient is medically stable for D/C to [facility] today. Blue Medicare SNF authorization through [facility] has been received. Per [facility] liaison patient can come today to [facility] to room 601. RN will call report and arrange EMS for transport. Clinical Education officer, museum (CSW) sent D/C orders to [facility] via [facility]. Patient is aware of above. Patient's husband Gene and son Fritz Pickerel are at bedside and aware of above. Please reconsult if future social work needs arise. CSW signing off.   [employer], LCSW [phone]

## 2018-07-03 NOTE — Clinical Social Work Placement (Signed)
   CLINICAL SOCIAL WORK PLACEMENT  NOTE  Date:  07/03/2018  Patient Details  Name: Vanessa Esparza MRN: [id number] Date of Birth: Oct 05, 1940  Clinical Social Work is seeking post-discharge placement for this patient at the [place] level of care (*CSW will initial, date and re-position this form in  chart as items are completed):  Yes   Patient/family provided with [place] Work Department's list of facilities offering this level of care within the geographic area requested by the patient (or if unable, by the patient's family).  Yes   Patient/family informed of their freedom to choose among providers that offer the needed level of care, that participate in Medicare, Medicaid or managed care program needed by the patient, have an available bed and are willing to accept the patient.  Yes   Patient/family informed of [facility]'s ownership interest in [facility] and [facility], as well as of the fact that they are under no obligation to receive care at these facilities.  PASRR submitted to EDS on 07/01/18     PASRR number received on 07/01/18     Existing PASRR number confirmed on       FL2 transmitted to all facilities in geographic area requested by pt/family on 07/01/18     FL2 transmitted to all facilities within larger geographic area on       Patient informed that his/her managed care company has contracts with or will negotiate with certain facilities, including the following:        Yes   Patient/family informed of bed offers received.  Patient chooses bed at ([facility] )     Physician recommends and patient chooses bed at      Patient to be transferred to ([facility] ) on 07/03/18.  Patient to be transferred to facility by [facility] )     Patient family notified on 07/03/18 of transfer.  Name of family member notified:  (Patient's son Fritz Pickerel and patient's husband Gene are aware of D/C today. )     PHYSICIAN        Additional Comment:    _______________________________________________ Kaari Zeigler, Veronia Beets, LCSW 07/03/2018, 11:21 AM

## 2018-07-03 NOTE — Progress Notes (Signed)
Physical Therapy Treatment Patient Details Name: Vanessa Esparza MRN: [id number] DOB: 1940-07-17 Today's Date: 07/03/2018    History of Present Illness Pt is a 78 yo female diagnosed with R knee OA and is s/p elective R TKA.  PMH includes: seizures (last one 02/08/09), anemia, renal cell CA, DM, HTN, and hypothyroidism.      PT Comments    Pt in recliner, ready to get back to bed for transport to rehab.  She was able to stand with min assist and walk 50' with walker and min assist.  Overall improved gait today and pt and family pleased with progress. Knee ROM remains guarded and limited.  Participated in exercises as described below.   Follow Up Recommendations  SNF     Equipment Recommendations  None recommended by PT    Recommendations for Other Services       Precautions / Restrictions Precautions Precautions: Knee;Fall Restrictions Weight Bearing Restrictions: Yes RLE Weight Bearing: Weight bearing as tolerated    Mobility  Bed Mobility Overal bed mobility: Needs Assistance Bed Mobility: Sit to Supine     Supine to sit: Min assist        Transfers Overall transfer level: Needs assistance Equipment used: Rolling walker (2 wheeled) Transfers: Sit to/from Stand Sit to Stand: Min guard;From elevated surface         General transfer comment: Extra time and effort with transfers but no physical assistance needed  Ambulation/Gait Ambulation/Gait assistance: Min guard Gait Distance (Feet): 50 Feet Assistive device: Rolling walker (2 wheeled) Gait Pattern/deviations: Step-to pattern;Decreased step length - right;Decreased step length - left;Decreased stance time - left Gait velocity: decreased       Stairs             Wheelchair Mobility    Modified Rankin (Stroke Patients Only)       Balance Overall balance assessment: Needs assistance Sitting-balance support: No upper extremity supported;Feet supported Sitting balance-Leahy Scale: Good     Standing balance support: During functional activity;Bilateral upper extremity supported Standing balance-Leahy Scale: Fair                              Cognition Arousal/Alertness: Awake/alert Behavior During Therapy: WFL for tasks assessed/performed Overall Cognitive Status: Within Functional Limits for tasks assessed                                        Exercises Total Joint Exercises Ankle Circles/Pumps: AROM;Both;10 reps;Strengthening Quad Sets: AROM;Strengthening;Right;5 reps;10 reps Gluteal Sets: Strengthening;Both;10 reps Long Arc Quad: AROM;Right;10 reps Knee Flexion: AROM;Right;10 reps Goniometric ROM: 77 flexion    General Comments        Pertinent Vitals/Pain Pain Assessment: 0-10 Pain Score: 2  Pain Location: R knee Pain Descriptors / Indicators: Aching;Sore Pain Intervention(s): Monitored during session    Home Living                      Prior Function            PT Goals (current goals can now be found in the care plan section) Progress towards PT goals: Progressing toward goals    Frequency    BID      PT Plan Current plan remains appropriate    Co-evaluation              AM-PAC  PT "6 Clicks" Mobility   Outcome Measure  Help needed turning from your back to your side while in a flat bed without using bedrails?: A Little Help needed moving from lying on your back to sitting on the side of a flat bed without using bedrails?: A Little Help needed moving to and from a bed to a chair (including a wheelchair)?: A Little Help needed standing up from a chair using your arms (e.g., wheelchair or bedside chair)?: A Little Help needed to walk in hospital room?: A Little Help needed climbing 3-5 steps with a railing? : A Lot 6 Click Score: 17    End of Session Equipment Utilized During Treatment: Gait belt Activity Tolerance: Patient tolerated treatment well Patient left: in bed;with call bell/phone  within reach;with bed alarm set;with family/visitor present Nurse Communication: Mobility status       Time: 4888-9169 PT Time Calculation (min) (ACUTE ONLY): 14 min  Charges:  $Gait Training: 8-22 mins                     Chesley Noon, PTA 07/03/18, 11:57 AM

## 2018-07-03 NOTE — Progress Notes (Signed)
   Subjective: 3 Days Post-Op Procedure(s) (LRB): TOTAL KNEE ARTHROPLASTY-RIGHT (Right) Patient reports pain as mild.   Patient is well, and has had no acute complaints or problems Denies any CP, SOB, ABD pain. We will continue therapy today.  Plan is to go Home after hospital stay.  Objective: Vital signs in last 24 hours: Temp:  [98.1 F (36.7 C)-98.6 F (37 C)] 98.4 F (36.9 C) (02/21 0740) Pulse Rate:  [80-84] 84 (02/21 0740) Resp:  [18] 18 (02/20 2353) BP: (128-140)/(54-67) 140/67 (02/21 0740) SpO2:  [94 %-98 %] 97 % (02/21 0740)  Intake/Output from previous day: 02/20 0701 - 02/21 0700 In: 240 [P.O.:240] Out: 400 [Urine:400] Intake/Output this shift: No intake/output data recorded.  Recent Labs    07/01/18 0359 07/02/18 0343  HGB 8.5* 8.3*   Recent Labs    07/01/18 0359 07/02/18 0343  WBC 4.9 6.2  RBC 3.47* 3.39*  HCT 26.7* 26.2*  PLT 272 259   Recent Labs    07/02/18 0343 07/03/18 0424  NA 133* 134*  K 3.1* 3.5  CL 97* 100  CO2 29 28  BUN 11 13  CREATININE 0.64 0.73  GLUCOSE 74 106*  CALCIUM 7.7* 7.4*   No results for input(s): LABPT, INR in the last 72 hours.  EXAM General - Patient is Alert, Appropriate and Oriented Extremity - Neurovascular intact Sensation intact distally Intact pulses distally Dorsiflexion/Plantar flexion intact No cellulitis present Compartment soft Dressing - dressing C/D/I and no drainage, Praveena intact without drainage. Motor Function - intact, moving foot and toes well on exam.   Past Medical History:  Diagnosis Date  . Anemia   . Arthritis   . Cancer (HCC)    RENAL CELL  . Diabetes mellitus without complication ([place])   . GERD (gastroesophageal reflux disease)   . Gout   . Hyperlipidemia   . Hypertension   . Hypothyroidism   . Meningioma ([place])   . Psoriasis   . Seizure ([place])   . Seizures ([place])    LAST SEIZURE ON 02-08-09    Assessment/Plan:   3 Days Post-Op Procedure(s) (LRB): TOTAL KNEE  ARTHROPLASTY-RIGHT (Right) Active Problems:   S/P TKR (total knee replacement) using cement, right   Acute post op blood loss anemia    Hypokalemia Estimated body mass index is 27.41 kg/m as calculated from the following:   Height as of this encounter: 5\' 10"  (1.778 m).   Weight as of this encounter: 86.6 kg. Advance diet Up with therapy  Acute post op blood loss anemia -hemoglobin 8.3.  Stable.  Continue iron supplementation. Hypokalemia-resolved Vital signs are stable Pain well controlled Care management to assist with discharge to SNF today  DVT Prophylaxis - Lovenox, TED hose and SCDs Weight-Bearing as tolerated to right leg   T. Rachelle Hora, PA-C [place] 07/03/2018, 9:34 AM

## 2018-07-03 NOTE — Progress Notes (Signed)
Patient is being discharged to [facility].  IV removed with cath intact. Wound vac changed over to [place]. Report called. Signed script sent in packet.

## 2019-01-28 ENCOUNTER — Other Ambulatory Visit: Payer: Self-pay | Admitting: Orthopedic Surgery

## 2019-01-28 DIAGNOSIS — M25551 Pain in right hip: Secondary | ICD-10-CM

## 2019-01-28 DIAGNOSIS — M5136 Other intervertebral disc degeneration, lumbar region: Secondary | ICD-10-CM

## 2019-01-28 DIAGNOSIS — M47816 Spondylosis without myelopathy or radiculopathy, lumbar region: Secondary | ICD-10-CM

## 2019-02-10 ENCOUNTER — Other Ambulatory Visit: Payer: Self-pay

## 2019-02-10 ENCOUNTER — Ambulatory Visit
Admission: RE | Admit: 2019-02-10 | Discharge: 2019-02-10 | Disposition: A | Discharge status: Home / Self Care | Payer: Medicare Other | Source: Ambulatory Visit | Attending: Orthopedic Surgery | Admitting: Orthopedic Surgery

## 2019-02-10 DIAGNOSIS — M25551 Pain in right hip: Secondary | ICD-10-CM | POA: Diagnosis present

## 2019-02-10 DIAGNOSIS — M5136 Other intervertebral disc degeneration, lumbar region: Secondary | ICD-10-CM | POA: Insufficient documentation

## 2019-02-10 DIAGNOSIS — M47816 Spondylosis without myelopathy or radiculopathy, lumbar region: Secondary | ICD-10-CM

## 2019-02-17 ENCOUNTER — Other Ambulatory Visit: Payer: Self-pay | Admitting: Orthopedic Surgery

## 2019-02-17 DIAGNOSIS — M25551 Pain in right hip: Secondary | ICD-10-CM

## 2019-02-22 ENCOUNTER — Other Ambulatory Visit: Payer: Self-pay | Admitting: Orthopedic Surgery

## 2019-02-22 DIAGNOSIS — M25551 Pain in right hip: Secondary | ICD-10-CM

## 2019-02-22 DIAGNOSIS — R1900 Intra-abdominal and pelvic swelling, mass and lump, unspecified site: Secondary | ICD-10-CM

## 2019-02-23 ENCOUNTER — Other Ambulatory Visit: Payer: Self-pay

## 2019-02-23 ENCOUNTER — Other Ambulatory Visit: Payer: Self-pay | Admitting: Orthopedic Surgery

## 2019-02-23 ENCOUNTER — Ambulatory Visit
Admission: RE | Admit: 2019-02-23 | Discharge: 2019-02-23 | Disposition: A | Discharge status: Home / Self Care | Payer: Medicare Other | Source: Ambulatory Visit | Attending: Orthopedic Surgery | Admitting: Orthopedic Surgery

## 2019-02-23 DIAGNOSIS — R1900 Intra-abdominal and pelvic swelling, mass and lump, unspecified site: Secondary | ICD-10-CM | POA: Diagnosis present

## 2019-02-23 DIAGNOSIS — M25551 Pain in right hip: Secondary | ICD-10-CM

## 2019-03-01 ENCOUNTER — Telehealth: Payer: Self-pay | Admitting: Urology

## 2019-03-01 NOTE — Telephone Encounter (Signed)
Pt called and states that she had an [place] last week and an MRI was recommended. She has a follow up with [facility] 03/25/2019, but states that she needs to be seen sooner.Please advise.

## 2019-03-01 NOTE — Telephone Encounter (Signed)
Called pt she states that she would like Dr. Bernardo Heater to review and advise on her pelvic u/s. Advised pt that this is not protocol and the ordering physician is who will follow the u/s and give results. Pt voiced understanding.

## 2019-03-05 ENCOUNTER — Other Ambulatory Visit (HOSPITAL_COMMUNITY): Payer: Self-pay | Admitting: Orthopedic Surgery

## 2019-03-05 ENCOUNTER — Other Ambulatory Visit: Payer: Self-pay | Admitting: Orthopedic Surgery

## 2019-03-05 DIAGNOSIS — R1900 Intra-abdominal and pelvic swelling, mass and lump, unspecified site: Secondary | ICD-10-CM

## 2019-03-22 ENCOUNTER — Other Ambulatory Visit: Payer: Self-pay

## 2019-03-22 ENCOUNTER — Ambulatory Visit
Admission: RE | Admit: 2019-03-22 | Discharge: 2019-03-22 | Disposition: A | Discharge status: Home / Self Care | Payer: Medicare Other | Source: Ambulatory Visit | Attending: Urology | Admitting: Urology

## 2019-03-22 DIAGNOSIS — Z85528 Personal history of other malignant neoplasm of kidney: Secondary | ICD-10-CM | POA: Diagnosis not present

## 2019-03-25 ENCOUNTER — Ambulatory Visit: Payer: Medicare Other | Admitting: Urology

## 2019-03-25 ENCOUNTER — Other Ambulatory Visit: Payer: Self-pay

## 2019-03-25 ENCOUNTER — Encounter: Payer: Self-pay | Admitting: Urology

## 2019-03-25 VITALS — BP 152/81 | HR 76 | Ht 67.0 in | Wt 190.0 lb

## 2019-03-25 DIAGNOSIS — Z85528 Personal history of other malignant neoplasm of kidney: Secondary | ICD-10-CM

## 2019-03-25 NOTE — Progress Notes (Signed)
03/25/2019 2:25 PM   Vanessa Esparza 10/29/1940 [id number]  Referring provider: [name], MD [address] [facility] [place],  [place] [id number]  Chief Complaint  Patient presents with  . Follow-up    Urologic history: 1. 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  She has a history of renal cell carcinoma status post partial nephrectomy in 2013.  Although no routine imaging was needed for surveillance she requested periodic renal ultrasound which was performed 03/22/2019 which showed changes consistent with prior left nephrectomy and no evidence of renal mass, calculi or hydronephrosis.  She had an MRI of the right hip performed late September 2020 which incidentally showed a 5.1 x 4.8 cm cystic lesion in the left renal pelvis.  Pelvic ultrasound was subsequently performed which showed a cystic mass of the left adnexa measuring 6.6 x 3.9 x 4.8 cm. She has seen gynecology and is scheduled for bilateral laparoscopic salpingo-oophorectomy on 04/12/19.  She has no bothersome lower urinary tract symptoms.  Denies dysuria or gross hematuria.   PMH: Past Medical History:  Diagnosis Date  . Anemia   . Arthritis   . Cancer (HCC)    RENAL CELL  . Diabetes mellitus without complication ([place])   . GERD (gastroesophageal reflux disease)   . Gout   . Hyperlipidemia   . Hypertension   . Hypothyroidism   . Meningioma ([place])   . Psoriasis   . Seizure ([place])   . Seizures ([place])    LAST SEIZURE ON 02-08-09    Surgical History: Past Surgical History:  Procedure Laterality Date  . ABDOMINAL HYSTERECTOMY    . BACK SURGERY     LUMBAR  . BRAIN MENINGIOMA EXCISION    . BRAIN SURGERY     CRANIOTOMY WITH EXCISION OF SUPRATENTORIAL MENINGIOMA  . COLONOSCOPY    . COLONOSCOPY WITH PROPOFOL N/A 01/28/2017   Procedure: COLONOSCOPY WITH PROPOFOL;  Surgeon: Lollie Sails,  MD;  Location: [facility] ENDOSCOPY;  Service: Endoscopy;  Laterality: N/A;  . ESOPHAGOGASTRODUODENOSCOPY    . ESOPHAGOGASTRODUODENOSCOPY (EGD) WITH PROPOFOL N/A 01/28/2017   Procedure: ESOPHAGOGASTRODUODENOSCOPY (EGD) WITH PROPOFOL;  Surgeon: Lollie Sails, MD;  Location: [facility] ENDOSCOPY;  Service: Endoscopy;  Laterality: N/A;  . HEMORRHOID SURGERY    . KNEE ARTHROSCOPY Right 09/21/2015   Procedure: ARTHROSCOPY KNEE, LATERAL AND MEDIAL MENISECTOMY;  Surgeon: Hessie Knows, MD;  Location: [facility] ORS;  Service: Orthopedics;  Laterality: Right;  . PARTIAL NEPHRECTOMY Left    DUE TO RENAL CELL CARCINOMA  . REDUCTION MAMMAPLASTY Bilateral   . THYROID SURGERY    . TOTAL KNEE ARTHROPLASTY Right 06/30/2018   Procedure: TOTAL KNEE ARTHROPLASTY-RIGHT;  Surgeon: Hessie Knows, MD;  Location: [facility] ORS;  Service: Orthopedics;  Laterality: Right;    Home Medications:  Allergies as of 03/25/2019      Reactions   Arava [leflunomide]    Swelling tongue and ulcers in mouth    Methotrexate Derivatives Swelling   Tongue swelling   Statins Other (See Comments)   BLOATED FEELING, CHEST FULLNESS      Medication List       Accurate as of March 25, 2019  2:25 PM. If you have any questions, ask your nurse or doctor.        allopurinol 100 MG tablet Commonly known as: ZYLOPRIM Take 100 mg by mouth daily.   bisacodyl 5 MG EC tablet Commonly known as: DULCOLAX Take  1 tablet (5 mg total) by mouth daily as needed for moderate constipation.   calcium carbonate 600 MG Tabs tablet Commonly known as: OS-CAL Take 600 mg by mouth daily.   colchicine 0.6 MG tablet Take 0.6 mg by mouth daily as needed (gout flare).   enoxaparin 40 MG/0.4ML injection Commonly known as: Lovenox Inject 0.4 mLs (40 mg total) into the skin daily for 14 days.   fexofenadine 180 MG tablet Commonly known as: ALLEGRA Take 180 mg by mouth daily as needed for allergies or rhinitis.   folic acid [id number] MCG tablet Commonly known as:  FOLVITE Take 800 mcg by mouth daily as needed (upset stomach).   glipiZIDE 5 MG tablet Commonly known as: GLUCOTROL Take 5 mg by mouth daily before breakfast.   glucose blood test strip USE 1 STRIP TO CHECK GLUCOSE TWICE DAILY   HYDROcodone-acetaminophen 5-325 MG tablet Commonly known as: NORCO/VICODIN Take 1-2 tablets by mouth every 4 (four) hours as needed for moderate pain (pain score 4-6).   levETIRAcetam 500 MG tablet Commonly known as: KEPPRA Take 500 mg by mouth 2 (two) times daily.   levothyroxine 150 MCG tablet Commonly known as: SYNTHROID Take 150 mcg by mouth daily before breakfast.   lisinopril-hydrochlorothiazide 20-25 MG tablet Commonly known as: ZESTORETIC Take 1 tablet by mouth daily.   omeprazole 40 MG capsule Commonly known as: PRILOSEC Take 40 mg by mouth daily.       Allergies:  Allergies  Allergen Reactions  . Arava [Leflunomide]     Swelling tongue and ulcers in mouth   . Methotrexate Derivatives Swelling    Tongue swelling  . Statins Other (See Comments)    BLOATED FEELING, CHEST FULLNESS    Family History: Family History  Problem Relation Age of Onset  . Breast cancer Other   . Breast cancer Mother     Social History:  reports that she has never smoked. She has never used smokeless tobacco. She reports that she does not drink alcohol or use drugs.  ROS: UROLOGY Frequent Urination?: No Hard to postpone urination?: No Burning/pain with urination?: No Get up at night to urinate?: Yes Leakage of urine?: No Urine stream starts and stops?: No Trouble starting stream?: No Do you have to strain to urinate?: No Blood in urine?: No Urinary tract infection?: No Sexually transmitted disease?: No Injury to kidneys or bladder?: No Painful intercourse?: No Weak stream?: No Currently pregnant?: No Vaginal bleeding?: No Last menstrual period?: n  Gastrointestinal Nausea?: No Vomiting?: No Indigestion/heartburn?: No Diarrhea?: No  Constipation?: No  Constitutional Fever: No Night sweats?: No Weight loss?: No Fatigue?: No  Skin Skin rash/lesions?: No Itching?: No  Eyes Blurred vision?: No Double vision?: No  Ears/Nose/Throat Sore throat?: No Sinus problems?: No  Hematologic/Lymphatic Swollen glands?: No Easy bruising?: No  Cardiovascular Leg swelling?: No Chest pain?: No  Respiratory Cough?: No Shortness of breath?: No  Endocrine Excessive thirst?: No  Musculoskeletal Back pain?: No Joint pain?: No  Neurological Headaches?: No Dizziness?: No  Psychologic Depression?: No Anxiety?: No  Physical Exam: BP (!) 152/81   Pulse 76   Ht 5\' 7"  (1.702 m)   Wt 190 lb (86.2 kg)   BMI 29.76 kg/m   Constitutional:  Alert and oriented, No acute distress. HEENT:  AT, moist mucus membranes.  Trachea midline, no masses. Cardiovascular: No clubbing, cyanosis, or edema. Respiratory: Normal respiratory effort, no increased work of breathing.   Assessment & Plan:   Status post left partial nephrectomy 2013 without  evidence of recurrent disease.  She will continue annual follow-up.  Return in about 1 year (around 03/24/2020) for Recheck.   Abbie Sons, [place] [address] [place], [place] [address] [phone]

## 2019-03-28 ENCOUNTER — Encounter: Payer: Self-pay | Admitting: Urology

## 2019-04-07 ENCOUNTER — Telehealth: Payer: Self-pay | Admitting: Cardiology

## 2019-04-07 ENCOUNTER — Encounter
Admission: RE | Admit: 2019-04-07 | Discharge: 2019-04-07 | Disposition: A | Discharge status: Home / Self Care | Payer: Medicare Other | Source: Ambulatory Visit | Attending: Obstetrics & Gynecology | Admitting: Obstetrics & Gynecology

## 2019-04-07 ENCOUNTER — Other Ambulatory Visit: Payer: Self-pay

## 2019-04-07 DIAGNOSIS — R9431 Abnormal electrocardiogram [ECG] [EKG]: Secondary | ICD-10-CM | POA: Diagnosis not present

## 2019-04-07 DIAGNOSIS — N83202 Unspecified ovarian cyst, left side: Secondary | ICD-10-CM | POA: Diagnosis not present

## 2019-04-07 DIAGNOSIS — E119 Type 2 diabetes mellitus without complications: Secondary | ICD-10-CM | POA: Insufficient documentation

## 2019-04-07 DIAGNOSIS — Z01818 Encounter for other preprocedural examination: Secondary | ICD-10-CM | POA: Diagnosis not present

## 2019-04-07 DIAGNOSIS — I10 Essential (primary) hypertension: Secondary | ICD-10-CM | POA: Diagnosis not present

## 2019-04-07 DIAGNOSIS — N83201 Unspecified ovarian cyst, right side: Secondary | ICD-10-CM | POA: Diagnosis not present

## 2019-04-07 LAB — TYPE AND SCREEN
ABO/RH(D): O POS
Antibody Screen: NEGATIVE

## 2019-04-07 LAB — CBC
HCT: 32.3 % — ABNORMAL LOW (ref 36.0–46.0)
Hemoglobin: 10.4 g/dL — ABNORMAL LOW (ref 12.0–15.0)
MCH: 24.1 pg — ABNORMAL LOW (ref 26.0–34.0)
MCHC: 32.2 g/dL (ref 30.0–36.0)
MCV: 74.9 fL — ABNORMAL LOW (ref 80.0–100.0)
Platelets: 291 10*3/uL (ref 150–400)
RBC: 4.31 MIL/uL (ref 3.87–5.11)
RDW: 14.6 % (ref 11.5–15.5)
WBC: 6.5 10*3/uL (ref 4.0–10.5)
nRBC: 0 % (ref 0.0–0.2)

## 2019-04-07 LAB — BASIC METABOLIC PANEL
Anion gap: 9 (ref 5–15)
BUN: 28 mg/dL — ABNORMAL HIGH (ref 8–23)
CO2: 30 mmol/L (ref 22–32)
Calcium: 8.5 mg/dL — ABNORMAL LOW (ref 8.9–10.3)
Chloride: 101 mmol/L (ref 98–111)
Creatinine, Ser: 0.92 mg/dL (ref 0.44–1.00)
GFR calc Af Amer: >60 mL/min (ref >60)
GFR calc non Af Amer: >60 mL/min (ref >60)
Glucose, Bld: 75 mg/dL (ref 70–99)
Potassium: 4 mmol/L (ref 3.5–5.1)
Sodium: 140 mmol/L (ref 135–145)

## 2019-04-07 NOTE — Pre-Procedure Instructions (Addendum)
Pre-Admit Testing Provider Communication Note  Provider:  Dr. Ola Spurr (Anesthesia)  Notification Mode: Secure Chat  Reason: Abnormal EKG result.  Secure Chat Sent: "Can you check out this patient's EKG?"   Response: "since this is a changer from her prior EKG we should get medical clearance from her PCP, thanks"   Additional Information: Faxed Request for Clearance to Dr. Kary Kos and Dr. Leonides Schanz for her information. Fax confirmation received for both. Placed on chart. Noted on Pre-Admit Worksheet.   Also left message with secretary at Dr. Barbarann Ehlers office about this.    UPDATE @ 1452 04/07/19:  I spoke with Ashlyn, a nurse in Dr. Barbarann Ehlers office who stated that there were no providers in the office for the rest of the week who would be available to provide medical clearance for this patient. But, that they would possibly be able to see the patient first thing Monday morning if the patient is not a first case.  I called messaged Dr. Leonides Schanz who returned my call. She is working on clearance this from this point.  Signed: Beulah Gandy, RN

## 2019-04-07 NOTE — Telephone Encounter (Signed)
Reviewed ekg and compared to previous ekgs. Mild lengthening of the qrs. No evidence of ischemic change. Based on surgical risk and ekg review, pt is at low risk for surgery. Would proceed with routine cardiac monitoring. Low risk for surgery. No further cardiac workup indicated.

## 2019-04-07 NOTE — Patient Instructions (Addendum)
Your procedure is scheduled on: Monday 04/12/19.  Report to DAY SURGERY DEPARTMENT LOCATED ON 2ND FLOOR MEDICAL MALL ENTRANCE. To find out your arrival time please call [phone] between 1PM - 3PM on Friday 04/09/19.   Remember: Instructions that are not followed completely may result in serious medical risk, up to and including death, or upon the discretion of your surgeon and anesthesiologist your surgery may need to be rescheduled.      _X__ 1. Do not eat food after midnight the night before your procedure.                 No gum chewing or hard candies. You may drink SUGAR FREE clear liquids up to 2 hours                 before you are scheduled to arrive for your surgery- DO NOT drink clear                 liquids within 2 hours of the start of your surgery.                 ** Dr. Leonides Schanz would like for you to finish the G2 Gatorade 2 hours before your scheduled arrival time on the morning of your surgery.**   __X__2.  On the morning of surgery brush your teeth with toothpaste and water, you may rinse your mouth with mouthwash if you wish.  Do not swallow any toothpaste or mouthwash.      __X__3.  Notify your doctor if there is any change in your medical condition      (cold, fever, infections).       Do not wear jewelry, make-up, hairpins, clips or nail polish. Do not wear lotions, powders, or perfumes.  Do not shave 48 hours prior to surgery. Men may shave face and neck. Do not bring valuables to the hospital.      [facility] is not responsible for any belongings or valuables.    Contacts, dentures/partials or body piercings may not be worn into surgery. Bring a case for your contacts, glasses or hearing aids, a denture cup will be supplied.     Patients discharged the day of surgery will not be allowed to drive home.     Please read over the following fact sheets that you were given:   MRSA Information    __X__ Take these medicines the morning of surgery  with A SIP OF WATER:     1. allopurinol (ZYLOPRIM) 100 MG tablet  2. fexofenadine (ALLEGRA) 180 MG tablet  3. levETIRAcetam (KEPPRA) 500 MG tablet  4. levothyroxine (SYNTHROID, LEVOTHROID) 150 MCG tablet  5. omeprazole (PRILOSEC) 40 MG capsule    __X__ Use CHG Soap as directed    __X__ Stop Anti-inflammatories 7 days before surgery such as Advil, Ibuprofen, Motrin, BC or Goodies Powder, Naprosyn, Naproxen, Aleve, Aspirin, Meloxicam. May take Tylenol if needed for pain or discomfort.     __X__ Don't start taking any new herbal supplements before your procedure.

## 2019-04-09 ENCOUNTER — Other Ambulatory Visit
Admission: RE | Admit: 2019-04-09 | Discharge: 2019-04-09 | Disposition: A | Discharge status: Home / Self Care | Payer: Medicare Other | Source: Ambulatory Visit | Attending: Obstetrics & Gynecology | Admitting: Obstetrics & Gynecology

## 2019-04-09 DIAGNOSIS — Z01812 Encounter for preprocedural laboratory examination: Secondary | ICD-10-CM | POA: Insufficient documentation

## 2019-04-09 DIAGNOSIS — Z20828 Contact with and (suspected) exposure to other viral communicable diseases: Secondary | ICD-10-CM | POA: Diagnosis not present

## 2019-04-10 LAB — SARS CORONAVIRUS 2 (TAT 6-24 HRS): SARS Coronavirus 2: NEGATIVE

## 2019-04-12 ENCOUNTER — Encounter
Admission: RE | Disposition: A | Discharge status: Home / Self Care | Payer: Self-pay | Source: Home / Self Care | Attending: Obstetrics & Gynecology

## 2019-04-12 ENCOUNTER — Ambulatory Visit
Admission: RE | Admit: 2019-04-12 | Discharge: 2019-04-12 | Disposition: A | Discharge status: Home / Self Care | Payer: Medicare Other | Attending: Obstetrics & Gynecology | Admitting: Obstetrics & Gynecology

## 2019-04-12 ENCOUNTER — Ambulatory Visit: Payer: Medicare Other | Admitting: Anesthesiology

## 2019-04-12 ENCOUNTER — Other Ambulatory Visit: Payer: Self-pay

## 2019-04-12 DIAGNOSIS — M109 Gout, unspecified: Secondary | ICD-10-CM | POA: Diagnosis not present

## 2019-04-12 DIAGNOSIS — Z79899 Other long term (current) drug therapy: Secondary | ICD-10-CM | POA: Diagnosis not present

## 2019-04-12 DIAGNOSIS — I10 Essential (primary) hypertension: Secondary | ICD-10-CM | POA: Diagnosis not present

## 2019-04-12 DIAGNOSIS — E039 Hypothyroidism, unspecified: Secondary | ICD-10-CM | POA: Diagnosis not present

## 2019-04-12 DIAGNOSIS — Z85528 Personal history of other malignant neoplasm of kidney: Secondary | ICD-10-CM | POA: Insufficient documentation

## 2019-04-12 DIAGNOSIS — E119 Type 2 diabetes mellitus without complications: Secondary | ICD-10-CM | POA: Diagnosis not present

## 2019-04-12 DIAGNOSIS — K2100 Gastro-esophageal reflux disease with esophagitis, without bleeding: Secondary | ICD-10-CM | POA: Diagnosis not present

## 2019-04-12 DIAGNOSIS — Z96651 Presence of right artificial knee joint: Secondary | ICD-10-CM | POA: Insufficient documentation

## 2019-04-12 DIAGNOSIS — Z7984 Long term (current) use of oral hypoglycemic drugs: Secondary | ICD-10-CM | POA: Insufficient documentation

## 2019-04-12 DIAGNOSIS — R569 Unspecified convulsions: Secondary | ICD-10-CM | POA: Diagnosis not present

## 2019-04-12 DIAGNOSIS — M069 Rheumatoid arthritis, unspecified: Secondary | ICD-10-CM | POA: Diagnosis not present

## 2019-04-12 DIAGNOSIS — N801 Endometriosis of ovary: Secondary | ICD-10-CM | POA: Insufficient documentation

## 2019-04-12 DIAGNOSIS — Z79890 Hormone replacement therapy (postmenopausal): Secondary | ICD-10-CM | POA: Insufficient documentation

## 2019-04-12 DIAGNOSIS — Z905 Acquired absence of kidney: Secondary | ICD-10-CM | POA: Diagnosis not present

## 2019-04-12 DIAGNOSIS — Z888 Allergy status to other drugs, medicaments and biological substances status: Secondary | ICD-10-CM | POA: Insufficient documentation

## 2019-04-12 DIAGNOSIS — D271 Benign neoplasm of left ovary: Secondary | ICD-10-CM | POA: Insufficient documentation

## 2019-04-12 DIAGNOSIS — N838 Other noninflammatory disorders of ovary, fallopian tube and broad ligament: Secondary | ICD-10-CM | POA: Insufficient documentation

## 2019-04-12 HISTORY — PX: LAPAROSCOPIC BILATERAL SALPINGO OOPHERECTOMY: SHX5890

## 2019-04-12 LAB — GLUCOSE, CAPILLARY: Glucose-Capillary: 144 mg/dL — ABNORMAL HIGH (ref 70–99)

## 2019-04-12 SURGERY — SALPINGO-OOPHORECTOMY, BILATERAL, LAPAROSCOPIC
Anesthesia: General | Laterality: Bilateral

## 2019-04-12 MED ORDER — BUPIVACAINE HCL (PF) 0.5 % IJ SOLN
INTRAMUSCULAR | Status: AC
  Filled 2019-04-12: qty 30

## 2019-04-12 MED ORDER — IBUPROFEN 800 MG PO TABS: 800.0000 mg | ORAL_TABLET | Freq: Four times a day (QID) | ORAL | 0 refills | Status: DC

## 2019-04-12 MED ORDER — HEPARIN SODIUM (PORCINE) 5000 UNIT/ML IJ SOLN
5000.0000 [IU] | INTRAMUSCULAR | Status: AC
  Administered 2019-04-12: 07:00:00 5000 [IU] via SUBCUTANEOUS

## 2019-04-12 MED ORDER — ONDANSETRON HCL 4 MG/2ML IJ SOLN
INTRAMUSCULAR | Status: DC | PRN
  Administered 2019-04-12: 4 mg via INTRAVENOUS

## 2019-04-12 MED ORDER — FENTANYL CITRATE (PF) 100 MCG/2ML IJ SOLN
INTRAMUSCULAR | Status: AC
  Filled 2019-04-12: qty 2

## 2019-04-12 MED ORDER — SCOPOLAMINE 1 MG/3DAYS TD PT72
1.0000 | MEDICATED_PATCH | TRANSDERMAL | Status: DC
  Administered 2019-04-12: 07:00:00 1.5 mg via TRANSDERMAL

## 2019-04-12 MED ORDER — OXYCODONE HCL 5 MG PO TABS: 5.0000 mg | ORAL_TABLET | ORAL | 0 refills | Status: DC | PRN

## 2019-04-12 MED ORDER — DEXAMETHASONE SODIUM PHOSPHATE 10 MG/ML IJ SOLN
INTRAMUSCULAR | Status: AC
  Administered 2019-04-12: 4 mg via INTRAVENOUS
  Filled 2019-04-12: qty 1

## 2019-04-12 MED ORDER — SUCCINYLCHOLINE CHLORIDE 20 MG/ML IJ SOLN
INTRAMUSCULAR | Status: AC
  Filled 2019-04-12: qty 1

## 2019-04-12 MED ORDER — LIDOCAINE HCL (CARDIAC) PF 100 MG/5ML IV SOSY
PREFILLED_SYRINGE | INTRAVENOUS | Status: DC | PRN
  Administered 2019-04-12: 100 mg via INTRAVENOUS

## 2019-04-12 MED ORDER — CELECOXIB 200 MG PO CAPS
ORAL_CAPSULE | ORAL | Status: AC
  Administered 2019-04-12: 400 mg via ORAL
  Filled 2019-04-12: qty 2

## 2019-04-12 MED ORDER — SUGAMMADEX SODIUM 200 MG/2ML IV SOLN
INTRAVENOUS | Status: AC
  Filled 2019-04-12: qty 2

## 2019-04-12 MED ORDER — ONDANSETRON HCL 4 MG/2ML IJ SOLN
INTRAMUSCULAR | Status: AC
  Filled 2019-04-12: qty 2

## 2019-04-12 MED ORDER — FENTANYL CITRATE (PF) 100 MCG/2ML IJ SOLN
INTRAMUSCULAR | Status: DC | PRN
  Administered 2019-04-12: 25 ug via INTRAVENOUS
  Administered 2019-04-12: 50 ug via INTRAVENOUS
  Administered 2019-04-12: 25 ug via INTRAVENOUS
  Administered 2019-04-12: 100 ug via INTRAVENOUS

## 2019-04-12 MED ORDER — SODIUM CHLORIDE 0.9 % IV SOLN
INTRAVENOUS | Status: DC
  Administered 2019-04-12: 07:00:00 via INTRAVENOUS

## 2019-04-12 MED ORDER — DEXAMETHASONE SODIUM PHOSPHATE 10 MG/ML IJ SOLN
INTRAMUSCULAR | Status: AC
  Filled 2019-04-12: qty 1

## 2019-04-12 MED ORDER — SUGAMMADEX SODIUM 200 MG/2ML IV SOLN
INTRAVENOUS | Status: DC | PRN
  Administered 2019-04-12: 180 mg via INTRAVENOUS

## 2019-04-12 MED ORDER — ACETAMINOPHEN 500 MG PO TABS
1000.0000 mg | ORAL_TABLET | ORAL | Status: AC
  Administered 2019-04-12: 07:00:00 1000 mg via ORAL

## 2019-04-12 MED ORDER — CELECOXIB 200 MG PO CAPS
400.0000 mg | ORAL_CAPSULE | ORAL | Status: AC
  Administered 2019-04-12: 07:00:00 400 mg via ORAL

## 2019-04-12 MED ORDER — EPHEDRINE SULFATE 50 MG/ML IJ SOLN
INTRAMUSCULAR | Status: DC | PRN
  Administered 2019-04-12: 10 mg via INTRAVENOUS

## 2019-04-12 MED ORDER — SCOPOLAMINE 1 MG/3DAYS TD PT72
MEDICATED_PATCH | TRANSDERMAL | Status: AC
  Administered 2019-04-12: 1.5 mg via TRANSDERMAL
  Filled 2019-04-12: qty 1

## 2019-04-12 MED ORDER — PROPOFOL 10 MG/ML IV BOLUS
INTRAVENOUS | Status: AC
  Filled 2019-04-12: qty 20

## 2019-04-12 MED ORDER — GLYCOPYRROLATE 0.2 MG/ML IJ SOLN
INTRAMUSCULAR | Status: AC
  Filled 2019-04-12: qty 1

## 2019-04-12 MED ORDER — HEPARIN SODIUM (PORCINE) 5000 UNIT/ML IJ SOLN
INTRAMUSCULAR | Status: AC
  Administered 2019-04-12: 5000 [IU] via SUBCUTANEOUS
  Filled 2019-04-12: qty 1

## 2019-04-12 MED ORDER — DEXAMETHASONE SODIUM PHOSPHATE 10 MG/ML IJ SOLN
4.0000 mg | INTRAMUSCULAR | Status: AC
  Administered 2019-04-12: 07:00:00 4 mg via INTRAVENOUS

## 2019-04-12 MED ORDER — ROCURONIUM BROMIDE 100 MG/10ML IV SOLN
INTRAVENOUS | Status: DC | PRN
  Administered 2019-04-12: 20 mg via INTRAVENOUS
  Administered 2019-04-12: 30 mg via INTRAVENOUS

## 2019-04-12 MED ORDER — PHENYLEPHRINE HCL (PRESSORS) 10 MG/ML IV SOLN
INTRAVENOUS | Status: DC | PRN
  Administered 2019-04-12: 50 ug via INTRAVENOUS

## 2019-04-12 MED ORDER — DEXAMETHASONE SODIUM PHOSPHATE 10 MG/ML IJ SOLN
INTRAMUSCULAR | Status: DC | PRN
  Administered 2019-04-12: 5 mg via INTRAVENOUS

## 2019-04-12 MED ORDER — FENTANYL CITRATE (PF) 100 MCG/2ML IJ SOLN
25.0000 ug | INTRAMUSCULAR | Status: DC | PRN
  Administered 2019-04-12: 25 ug via INTRAVENOUS

## 2019-04-12 MED ORDER — GABAPENTIN 300 MG PO CAPS
ORAL_CAPSULE | ORAL | Status: AC
  Administered 2019-04-12: 600 mg via ORAL
  Filled 2019-04-12: qty 2

## 2019-04-12 MED ORDER — PROPOFOL 10 MG/ML IV BOLUS
INTRAVENOUS | Status: DC | PRN
  Administered 2019-04-12: 150 mg via INTRAVENOUS

## 2019-04-12 MED ORDER — PHENYLEPHRINE HCL (PRESSORS) 10 MG/ML IV SOLN
INTRAVENOUS | Status: AC
  Filled 2019-04-12: qty 1

## 2019-04-12 MED ORDER — ONDANSETRON HCL 4 MG/2ML IJ SOLN: 4.0000 mg | Freq: Once | INTRAMUSCULAR | Status: DC | PRN

## 2019-04-12 MED ORDER — LIDOCAINE HCL (PF) 2 % IJ SOLN
INTRAMUSCULAR | Status: AC
  Filled 2019-04-12: qty 10

## 2019-04-12 MED ORDER — GLYCOPYRROLATE 0.2 MG/ML IJ SOLN
INTRAMUSCULAR | Status: DC | PRN
  Administered 2019-04-12: 0.2 mg via INTRAVENOUS

## 2019-04-12 MED ORDER — BUPIVACAINE LIPOSOME 1.3 % IJ SUSP
INTRAMUSCULAR | Status: AC
  Filled 2019-04-12: qty 20

## 2019-04-12 MED ORDER — ACETAMINOPHEN 500 MG PO TABS
ORAL_TABLET | ORAL | Status: AC
  Administered 2019-04-12: 1000 mg via ORAL
  Filled 2019-04-12: qty 2

## 2019-04-12 MED ORDER — GABAPENTIN 300 MG PO CAPS
600.0000 mg | ORAL_CAPSULE | ORAL | Status: AC
  Administered 2019-04-12: 07:00:00 600 mg via ORAL

## 2019-04-12 MED ORDER — EPHEDRINE SULFATE 50 MG/ML IJ SOLN
INTRAMUSCULAR | Status: AC
  Filled 2019-04-12: qty 1

## 2019-04-12 SURGICAL SUPPLY — 50 items
BAG URINE DRAIN 2000ML AR STRL (UROLOGICAL SUPPLIES) ×3 IMPLANT
BLADE SURG SZ11 CARB STEEL (BLADE) ×3 IMPLANT
CANISTER SUCT 1200ML W/VALVE (MISCELLANEOUS) ×3 IMPLANT
CATH FOLEY 2WAY  5CC 16FR (CATHETERS) ×2
CATH URTH 16FR FL 2W BLN LF (CATHETERS) ×1 IMPLANT
CHLORAPREP W/TINT 26 (MISCELLANEOUS) ×3 IMPLANT
CNTNR SPEC 2.5X3XGRAD LEK (MISCELLANEOUS) ×1
CONT SPEC 4OZ STER OR WHT (MISCELLANEOUS) ×2
CONTAINER SPEC 2.5X3XGRAD LEK (MISCELLANEOUS) ×1 IMPLANT
COVER WAND RF STERILE (DRAPES) ×3 IMPLANT
DERMABOND ADVANCED (GAUZE/BANDAGES/DRESSINGS) ×2
DERMABOND ADVANCED .7 DNX12 (GAUZE/BANDAGES/DRESSINGS) ×1 IMPLANT
DRAPE LEGGINS SURG 28X43 STRL (DRAPES) ×3 IMPLANT
DRAPE UNDER BUTTOCK W/FLU (DRAPES) ×3 IMPLANT
DRESSING SURGICEL FIBRLLR 1X2 (HEMOSTASIS) ×1 IMPLANT
DRSG SURGICEL FIBRILLAR 1X2 (HEMOSTASIS) ×3
GAUZE 4X4 16PLY RFD (DISPOSABLE) ×3 IMPLANT
GLOVE PI ORTHOPRO 6.5 (GLOVE) ×2
GLOVE PI ORTHOPRO STRL 6.5 (GLOVE) ×1 IMPLANT
GLOVE SURG SYN 6.5 ES PF (GLOVE) ×3 IMPLANT
GOWN STRL REUS W/ TWL LRG LVL3 (GOWN DISPOSABLE) ×2 IMPLANT
GOWN STRL REUS W/ TWL XL LVL3 (GOWN DISPOSABLE) ×1 IMPLANT
GOWN STRL REUS W/TWL LRG LVL3 (GOWN DISPOSABLE) ×4
GOWN STRL REUS W/TWL XL LVL3 (GOWN DISPOSABLE) ×2
IRRIGATION STRYKERFLOW (MISCELLANEOUS) IMPLANT
IRRIGATOR STRYKERFLOW (MISCELLANEOUS)
IV LACTATED RINGERS 1000ML (IV SOLUTION) IMPLANT
KIT PINK PAD W/HEAD ARE REST (MISCELLANEOUS) ×3
KIT PINK PAD W/HEAD ARM REST (MISCELLANEOUS) ×1 IMPLANT
KIT TURNOVER CYSTO (KITS) ×3 IMPLANT
KITTNER LAPARASCOPIC 5X40 (MISCELLANEOUS) ×3 IMPLANT
L-HOOK LAP DISP 36CM (ELECTROSURGICAL) ×3
LABEL OR SOLS (LABEL) IMPLANT
LHOOK LAP DISP 36CM (ELECTROSURGICAL) ×1 IMPLANT
LIGASURE VESSEL 5MM BLUNT TIP (ELECTROSURGICAL) ×3 IMPLANT
MANIPULATOR UTERINE 4.5 ZUMI (MISCELLANEOUS) IMPLANT
NS IRRIG 500ML POUR BTL (IV SOLUTION) ×3 IMPLANT
PACK LAP CHOLECYSTECTOMY (MISCELLANEOUS) ×3 IMPLANT
PAD OB MATERNITY 4.3X12.25 (PERSONAL CARE ITEMS) ×3 IMPLANT
PAD PREP 24X41 OB/GYN DISP (PERSONAL CARE ITEMS) ×3 IMPLANT
POUCH SPECIMEN RETRIEVAL 10MM (ENDOMECHANICALS) IMPLANT
SET TUBE SMOKE EVAC HIGH FLOW (TUBING) ×3 IMPLANT
SLEEVE ENDOPATH XCEL 5M (ENDOMECHANICALS) ×6 IMPLANT
SUT MNCRL 4-0 (SUTURE) ×4
SUT MNCRL 4-0 27XMFL (SUTURE) ×2
SUT MNCRL AB 4-0 PS2 18 (SUTURE) ×3 IMPLANT
SUT VIC AB 0 CT1 36 (SUTURE) ×3 IMPLANT
SUTURE MNCRL 4-0 27XMF (SUTURE) ×2 IMPLANT
TROCAR ENDO BLADELESS 11MM (ENDOMECHANICALS) ×3 IMPLANT
TROCAR XCEL NON-BLD 5MMX100MML (ENDOMECHANICALS) ×3 IMPLANT

## 2019-04-12 NOTE — Progress Notes (Signed)
Ch visited pt in pre-op who is a 78 y.o. female here for a cyst removal procedure. Pt shared that she has not been in significant pain. Pt presented to have a moderate affect yet did not share any major concerns regarding the procedure which she reports will not require her to stay overnight. Pt has a spouse at home and has a niece that will be driving her home post-op. Ch provided a compassionate presence, social support, and words of encouragement.  No further needs at this time.    04/12/19 0700  Clinical Encounter Type  Visited With Patient  Visit Type Social support;Pre-op  Stress Factors  Patient Stress Factors Health changes  Family Stress Factors None identified

## 2019-04-12 NOTE — Op Note (Signed)
Vanessa Esparza PROCEDURE DATE: 04/12/2019  PATIENT:  Vanessa Esparza  78 y.o. female  PRE-OPERATIVE DIAGNOSIS:  ovarian mass  POST-OPERATIVE DIAGNOSIS:  ovarian mass  PROCEDURE:  Procedure(s): LAPAROSCOPIC BILATERAL SALPINGO OOPHORECTOMY (Bilateral)  SURGEON:  Surgeon(s) and Role:    * Alfretta Pinch, Honor Loh, MD - Primary  ANESTHESIA:  General via ET  I/O  Total I/O In: 800 [I.V.:800] Out: 110 [Urine:100; Blood:10]  FINDINGS: Normal upper abdomen.  Normal right ovarian complex, cystic, enlarged left ovarian/tubal complex.  Smooth walled, no excrescences or nodularity.  The left ovary was adherent to the sigmoid colon and pelvic side wall.   SPECIMEN: bilateral tubes and ovaries, pelvic washings  COMPLICATIONS: none apparent  DISPOSITION: vital signs stable to PACU   Indication for Surgery: 78 y.o. with history of renal cell CA (clear cell) who had an MRI of the right hip in 01/2019 that showed a 5cm cystic mass in the left pelvis. Ultrasound confirmed this.   Risks of surgery were discussed with the patient including but not limited to: bleeding which may require transfusion or reoperation; infection which may require antibiotics; injury to bowel, bladder, ureters or other surrounding organs; need for additional procedures including laparotomy, blood clot, incisional problems and other postoperative/anesthesia complications. Written informed consent was obtained.    PROCEDURE IN DETAIL:  The patient had sequential compression devices applied to her lower extremities while in the preoperative area.  She was then taken to the operating room where general anesthesia was administered and was found to be adequate.  She was placed supine, and was prepped and draped in a sterile manner.  A Foley catheter was inserted into her bladder and attached to constant drainage. After a surgical timeout was performed, attention was turned to the abdomen where an umbilical incision was made with the  scalpel. A hassan open technique was used to enter the abdominal cavity, and an 33mm trochar was inserted in the umbilical incision. Opening pressure was 52mmHg, and the abdomen was insufflated to 1mmHg carbon dioxide gas and adequate pneumoperitoneum was obtained.  A survey of the patient's pelvis and abdomen revealed the findings as mentioned above. Two 38mm ports were inserted in the lower left and right quadrants under visualization.  She was inverted into Trendelenburg.  Bilateral ureters were observed vermiculating.  The cystic structure in the left adnexa was the left tube and ovary complex.  The right ovary was unremarkable.  The right pelvic side wall was entered with cautery and the IP ligament was isolated, skeletonized and cauterized thrice and divided with the LigaSure.  The Ovary was found to be adherent to the serosa of the sigmoid colon.  Careful blunt and sharp division was used to try to separate these to organs.  Once obvious tissue planes were defined, cautery was used sparingly on the ovarian side to attempt to divide the adhesions.  The left sidewall was divided with cautery, and the IP ligament was also retracted medially, skeletonized, and thrice cauterized and divided with the ligasure.  This allowed mobilization of the ovary medially and the dissection off of the sigmoid progressed carefully and without difficulty.  Once the ovary was separated from the sigmoid, The two ovaries were placed into the endopouch that had been inserted.  The pouch was retrieved through the umbilical port and opened at the base of the skin.  The cyst was punctured and drained for clear, colorless fluid.  The ovaires were removed and handed off to be sent to pathology.  The  operative site was surveyed, and it was found to be hemostatic.  No intraoperative injury to surrounding organs was noted. The fascia of the umbilical port was closed with 0-vicryl using the inlet closure device.  The abdomen was  desufflated and all instruments were then removed from the patient's abdomen. All skin incisions were closed with 4-0 monocryl and covered with surgical glue. The patient tolerated the procedures well.  All instruments, needles, and sponge counts were correct x 2. The patient was taken to the recovery room in stable condition.   ---- Larey Days, MD Attending Obstetrician and [facility]

## 2019-04-12 NOTE — Anesthesia Procedure Notes (Signed)
Procedure Name: Intubation Date/Time: 04/12/2019 8:28 AM Performed by: Jonna Clark, CRNA Pre-anesthesia Checklist: Patient identified, Patient being monitored, Timeout performed, Emergency Drugs available and Suction available Patient Re-evaluated:Patient Re-evaluated prior to induction Oxygen Delivery Method: Circle system utilized Preoxygenation: Pre-oxygenation with 100% oxygen Induction Type: IV induction Ventilation: Mask ventilation without difficulty Laryngoscope Size: 3 and McGraph Grade View: Grade I Tube type: Oral Tube size: 7.0 mm Number of attempts: 1 Airway Equipment and Method: Stylet Placement Confirmation: ETT inserted through vocal cords under direct vision,  positive ETCO2 and breath sounds checked- equal and bilateral Secured at: 21 cm Tube secured with: Tape Dental Injury: Teeth and Oropharynx as per pre-operative assessment

## 2019-04-12 NOTE — Anesthesia Preprocedure Evaluation (Addendum)
Anesthesia Evaluation  Patient identified by MRN, date of birth, ID band Patient awake    Reviewed: Allergy & Precautions, NPO status , Patient's Chart, lab work & pertinent test results  History of Anesthesia Complications Negative for: history of anesthetic complications  Airway Mallampati: III       Dental   Pulmonary neg sleep apnea, neg COPD,           Cardiovascular hypertension, Pt. on medications      Neuro/Psych Seizures - (one time after "brain surgery" none for 10 years), Well Controlled,     GI/Hepatic Neg liver ROS, GERD  Medicated and Controlled,  Endo/Other  diabetes, Type 2, Oral Hypoglycemic AgentsHypothyroidism   Renal/GU Renal disease (renal CA, S/P partial nephrectomy)     Musculoskeletal   Abdominal   Peds  Hematology  (+) anemia ,   Anesthesia Other Findings   Reproductive/Obstetrics                            Anesthesia Physical Anesthesia Plan  ASA: III  Anesthesia Plan: General   Post-op Pain Management:    Induction:   PONV Risk Score and Plan: 3 and Dexamethasone, Ondansetron and Midazolam  Airway Management Planned: Oral ETT  Additional Equipment:   Intra-op Plan:   Post-operative Plan:   Informed Consent: I have reviewed the patients History and Physical, chart, labs and discussed the procedure including the risks, benefits and alternatives for the proposed anesthesia with the patient or authorized representative who has indicated his/her understanding and acceptance.       Plan Discussed with:   Anesthesia Plan Comments:         Anesthesia Quick Evaluation

## 2019-04-12 NOTE — H&P (Signed)
Preoperative History and Physical  Vanessa Esparza is a 78 y.o. here for surgical management of adnexal mass.  She was cleared by cardiology review of EKG.    Proposed surgery: laparoscopic bilateral salpingo-oophorectomy  Past Medical History:  Diagnosis Date  . Anemia   . Arthritis   . Cancer (HCC)    RENAL CELL  . Diabetes mellitus without complication ([place])   . GERD (gastroesophageal reflux disease)   . Gout   . Hyperlipidemia   . Hypertension   . Hypothyroidism   . Meningioma ([place])   . Psoriasis   . Seizure ([place])   . Seizures ([place])    LAST SEIZURE ON 02-08-09   Past Surgical History:  Procedure Laterality Date  . ABDOMINAL HYSTERECTOMY    . BACK SURGERY     LUMBAR  . BRAIN MENINGIOMA EXCISION    . BRAIN SURGERY     CRANIOTOMY WITH EXCISION OF SUPRATENTORIAL MENINGIOMA  . COLONOSCOPY    . COLONOSCOPY WITH PROPOFOL N/A 01/28/2017   Procedure: COLONOSCOPY WITH PROPOFOL;  Surgeon: Lollie Sails, MD;  Location: [facility] ENDOSCOPY;  Service: Endoscopy;  Laterality: N/A;  . ESOPHAGOGASTRODUODENOSCOPY    . ESOPHAGOGASTRODUODENOSCOPY (EGD) WITH PROPOFOL N/A 01/28/2017   Procedure: ESOPHAGOGASTRODUODENOSCOPY (EGD) WITH PROPOFOL;  Surgeon: Lollie Sails, MD;  Location: [facility] ENDOSCOPY;  Service: Endoscopy;  Laterality: N/A;  . HEMORRHOID SURGERY    . KNEE ARTHROSCOPY Right 09/21/2015   Procedure: ARTHROSCOPY KNEE, LATERAL AND MEDIAL MENISECTOMY;  Surgeon: Hessie Knows, MD;  Location: [facility] ORS;  Service: Orthopedics;  Laterality: Right;  . PARTIAL NEPHRECTOMY Left    DUE TO RENAL CELL CARCINOMA  . REDUCTION MAMMAPLASTY Bilateral   . THYROID SURGERY    . TOTAL KNEE ARTHROPLASTY Right 06/30/2018   Procedure: TOTAL KNEE ARTHROPLASTY-RIGHT;  Surgeon: Hessie Knows, MD;  Location: [facility] ORS;  Service: Orthopedics;  Laterality: Right;   OB History  No obstetric history on file.  Patient denies any other pertinent gynecologic issues.   No current facility-administered  medications on file prior to encounter.    Current Outpatient Medications on File Prior to Encounter  Medication Sig Dispense Refill  . allopurinol (ZYLOPRIM) 100 MG tablet Take 100 mg by mouth daily.     . calcium carbonate (OS-CAL) 600 MG TABS tablet Take 600 mg by mouth daily.    . fexofenadine (ALLEGRA) 180 MG tablet Take 180 mg by mouth daily.     Marland Kitchen glipiZIDE (GLUCOTROL) 5 MG tablet Take 5 mg by mouth daily before breakfast.    . levETIRAcetam (KEPPRA) 500 MG tablet Take 500 mg by mouth 2 (two) times daily.    Marland Kitchen levothyroxine (SYNTHROID, LEVOTHROID) 150 MCG tablet Take 150 mcg by mouth daily before breakfast.    . lisinopril-hydrochlorothiazide (PRINZIDE,ZESTORETIC) 20-25 MG tablet Take 1 tablet by mouth daily.    Marland Kitchen omeprazole (PRILOSEC) 40 MG capsule Take 40 mg by mouth daily.    . folic acid (FOLVITE) [id number] MCG tablet Take 800 mcg by mouth daily as needed (upset stomach).    Marland Kitchen glucose blood test strip USE 1 STRIP TO CHECK GLUCOSE TWICE DAILY     Allergies  Allergen Reactions  . Arava [Leflunomide]     Swelling tongue and ulcers in mouth   . Methotrexate Derivatives Swelling    Tongue swelling  . Statins Other (See Comments)    BLOATED FEELING, CHEST FULLNESS    Social History:   reports that she has never smoked. She has never used smokeless tobacco. She reports that  she does not drink alcohol or use drugs.  Family History  Problem Relation Age of Onset  . Breast cancer Other   . Breast cancer Mother     Review of Systems: Noncontributory  PHYSICAL EXAM: Blood pressure (!) 180/76, pulse 64, temperature 97.8 F (36.6 C), temperature source Tympanic, resp. rate 16, SpO2 99 %. General appearance - alert, well appearing, and in no distress Chest - clear to auscultation, no wheezes, rales or rhonchi, symmetric air entry Heart - normal rate and regular rhythm Abdomen - soft, nontender, nondistended, no masses or organomegaly Pelvic - examination not indicated Extremities -  peripheral pulses normal, no pedal edema, no clubbing or cyanosis  Labs: Results for orders placed or performed during the hospital encounter of 04/09/19 (from the past 336 hour(s))  SARS CORONAVIRUS 2 (TAT 6-24 HRS) Nasopharyngeal Nasopharyngeal Swab   Collection Time: 04/09/19 10:36 AM   Specimen: Nasopharyngeal Swab  Result Value Ref Range   SARS Coronavirus 2 NEGATIVE NEGATIVE  Results for orders placed or performed during the hospital encounter of 04/07/19 (from the past 336 hour(s))  Basic metabolic panel   Collection Time: 04/07/19 11:16 AM  Result Value Ref Range   Sodium 140 135 - 145 mmol/L   Potassium 4.0 3.5 - 5.1 mmol/L   Chloride 101 98 - 111 mmol/L   CO2 30 22 - 32 mmol/L   Glucose, Bld 75 70 - 99 mg/dL   BUN 28 (H) 8 - 23 mg/dL   Creatinine, Ser 0.92 0.44 - 1.00 mg/dL   Calcium 8.5 (L) 8.9 - 10.3 mg/dL   GFR calc non Af Amer >60 >60 mL/min   GFR calc Af Amer >60 >60 mL/min   Anion gap 9 5 - 15  CBC   Collection Time: 04/07/19 11:16 AM  Result Value Ref Range   WBC 6.5 4.0 - 10.5 K/uL   RBC 4.31 3.87 - 5.11 MIL/uL   Hemoglobin 10.4 (L) 12.0 - 15.0 g/dL   HCT 32.3 (L) 36.0 - 46.0 %   MCV 74.9 (L) 80.0 - 100.0 fL   MCH 24.1 (L) 26.0 - 34.0 pg   MCHC 32.2 30.0 - 36.0 g/dL   RDW 14.6 11.5 - 15.5 %   Platelets 291 150 - 400 K/uL   nRBC 0.0 0.0 - 0.2 %  Type and screen [place]   Collection Time: 04/07/19 11:16 AM  Result Value Ref Range   ABO/RH(D) O POS    Antibody Screen NEG    Sample Expiration 04/21/2019,2359    Extend sample reason      NO TRANSFUSIONS OR PREGNANCY IN THE PAST 3 MONTHS Performed at [facility], [address], [place], [place] [address]     Imaging Studies: Ultrasound Renal Complete  Result Date: 03/22/2019 CLINICAL DATA:  Status post partial left nephrectomy for renal cell carcinoma. EXAM: RENAL / URINARY TRACT ULTRASOUND COMPLETE COMPARISON:  Abdomen and pelvis CT dated 05/02/2014 FINDINGS: Right  Kidney: Renal measurements: 11.7 x 5.5 x 5.4 cm = volume: 181 mL . Echogenicity within normal limits. No mass or hydronephrosis visualized. Left Kidney: Renal measurements: 9.7 x 4.6 x 4.0 cm = volume: 94 mL. Echogenicity within normal limits. No mass or hydronephrosis visualized. Cortical scar from previous surgery. Bladder: Appears normal for degree of bladder distention. Other: Interval 6.7 x 6.1 x 3.9 cm left adnexal cyst with soft tissue irregularity and mildly thickened septation posteriorly. No internal blood flow seen color Doppler. IMPRESSION: 1. Interval 6.7 cm complex  left adnexal cystic lesion. This is concerning for a possible primary ovarian neoplasm or metastasis. Further evaluation with pre and postcontrast magnetic resonance imaging of the pelvis is recommended. 2. Status post partial left nephrectomy without evidence of tumor recurrence in the urinary tract. Electronically Signed   By: Claudie Revering M.D.   On: 03/22/2019 13:05    Assessment: Patient Active Problem List   Diagnosis Date Noted  . S/P TKR (total knee replacement) using cement, right 06/30/2018  . 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 Personal history of renal cell carcinoma 09/30/2016  . Anemia 08/16/2016  . Diabetes mellitus type 2, uncomplicated ([place]) [id number]  . Hyperlipidemia, unspecified 08/16/2016  . Hypertension 08/16/2016  . Thyroid disease 08/16/2016  . Acquired cyst of kidney 04/17/2012  . Mixed urge and stress incontinence 04/17/2012  . Renal colic [id number]  . Malignant neoplasm of kidney ([place]) 03/28/2011  . Benign neoplasm of brain ([place]) 07/04/2008  . Esophageal reflux 07/04/2008  . Hypercholesterolemia 07/04/2008  . Hypertension, benign 07/04/2008  . Hypothyroidism (acquired) 07/04/2008    Plan: Patient will undergo surgical management with laparoscopic bilateral salpingo-oophorectomy.   The risks of surgery were discussed in detail with the  patient including but not limited to: bleeding which may require transfusion or reoperation; infection which may require antibiotics; injury to surrounding organs which may involve bowel, bladder, ureters ; need for additional procedures including laparoscopy or laparotomy; thromboembolic phenomenon, surgical site problems and other postoperative/anesthesia complications. Likelihood of success in alleviating the patient's condition was discussed. Routine postoperative instructions will be reviewed with the patient and her family in detail after surgery.  The patient concurred with the proposed plan, giving informed written consent for the surgery.  Patient has been NPO since last night she will remain NPO for procedure.  Anesthesia and OR aware.  ERAS meds and SCDs ordered on call to the OR.  To OR when ready.  ----- Larey Days, MD, [place] Attending Obstetrician and Gynecologist [facility], Department of [facility]

## 2019-04-12 NOTE — Anesthesia Post-op Follow-up Note (Signed)
Anesthesia QCDR form completed.        

## 2019-04-12 NOTE — Anesthesia Postprocedure Evaluation (Signed)
Anesthesia Post Note  Patient: Vanessa Esparza  Procedure(s) Performed: LAPAROSCOPIC BILATERAL SALPINGO OOPHORECTOMY (Bilateral )  Patient location during evaluation: PACU Anesthesia Type: General Level of consciousness: awake and alert Pain management: pain level controlled Vital Signs Assessment: post-procedure vital signs reviewed and stable Respiratory status: spontaneous breathing and respiratory function stable Cardiovascular status: stable Anesthetic complications: no     Last Vitals:  Vitals:   04/12/19 1046 04/12/19 1050  BP: (!) 174/77   Pulse: 74 77  Resp: 13 (!) 21  Temp:    SpO2: 100% 96%    Last Pain:  Vitals:   04/12/19 1031  TempSrc:   PainSc: Asleep                 [name] K

## 2019-04-12 NOTE — Transfer of Care (Signed)
Immediate Anesthesia Transfer of Care Note  Patient: Vanessa Esparza  Procedure(s) Performed: LAPAROSCOPIC BILATERAL SALPINGO OOPHORECTOMY (Bilateral )  Patient Location: PACU  Anesthesia Type:General  Level of Consciousness: sedated  Airway & Oxygen Therapy: Patient Spontanous Breathing and Patient connected to face mask oxygen  Post-op Assessment: Report given to RN and Post -op Vital signs reviewed and stable  Post vital signs: Reviewed and stable  Last Vitals:  Vitals Value Taken Time  BP 168/83 04/12/19 1031  Temp 36.1 C 04/12/19 1031  Pulse 74 04/12/19 1032  Resp 18 04/12/19 1032  SpO2 100 % 04/12/19 1032  Vitals shown include unvalidated device data.  Last Pain:  Vitals:   04/12/19 [id number]  TempSrc: Tympanic  PainSc: 0-No pain         Complications: No apparent anesthesia complications

## 2019-04-12 NOTE — Discharge Instructions (Signed)
Discharge instructions:  Call office if you have any of the following: fever >101 F, chills, shortness of breath, excessive vaginal bleeding, incision drainage or problems, leg pain or redness, or any other concerns.   Do not lift >20lbs for the first 2 weeks Do not drive on narcotics or until you are certain you will be able to slam on the brakes.  You may feel some pain in your upper right abdomen/rib and right shoulder.  This is from the gas in the abdomen for surgery. This will subside over time, please be patient!  Take 800mg  Ibuprofen and 1000mg  Tylenol around the clock, every 6 hours for at least the first 3-5 days.  After this you can take as needed.  This will help decrease inflammation and promote healing.  The narcotics you'll take just as needed, as they just trick your brain into thinking its not in pain.    Please don't limit yourself in terms of routine activity.  You will be able to do most things, although they may take longer to do or be a little painful.  You can do it!  Don't be a hero, but don't be a wimp either!    AMBULATORY SURGERY  DISCHARGE INSTRUCTIONS   1) The drugs that you were given will stay in your system until tomorrow so for the next 24 hours you should not:  A) Drive an automobile B) Make any legal decisions C) Drink any alcoholic beverage   2) You may resume regular meals tomorrow.  Today it is better to start with liquids and gradually work up to solid foods.  You may eat anything you prefer, but it is better to start with liquids, then soup and crackers, and gradually work up to solid foods.   3) Please notify your doctor immediately if you have any unusual bleeding, trouble breathing, redness and pain at the surgery site, drainage, fever, or pain not relieved by medication.    4) Additional Instructions:        Please contact your physician with any problems or Same Day Surgery at [phone], Monday through Friday 6 am to 4 pm, or  [place] at [facility] number at [phone].

## 2019-04-13 LAB — CYTOLOGY - NON PAP

## 2019-04-13 LAB — SURGICAL PATHOLOGY

## 2019-04-13 LAB — GLUCOSE, CAPILLARY: Glucose-Capillary: 98 mg/dL (ref 70–99)

## 2019-07-30 ENCOUNTER — Other Ambulatory Visit: Payer: Self-pay | Admitting: Family Medicine

## 2019-07-30 DIAGNOSIS — Z1231 Encounter for screening mammogram for malignant neoplasm of breast: Secondary | ICD-10-CM

## 2019-08-03 ENCOUNTER — Ambulatory Visit
Admission: RE | Admit: 2019-08-03 | Discharge: 2019-08-03 | Disposition: A | Discharge status: Home / Self Care | Payer: Medicare Other | Source: Ambulatory Visit | Attending: Family Medicine | Admitting: Family Medicine

## 2019-08-03 DIAGNOSIS — Z1231 Encounter for screening mammogram for malignant neoplasm of breast: Secondary | ICD-10-CM

## 2020-01-25 ENCOUNTER — Encounter: Payer: Self-pay | Admitting: Physician Assistant

## 2020-01-25 ENCOUNTER — Ambulatory Visit (INDEPENDENT_AMBULATORY_CARE_PROVIDER_SITE_OTHER): Payer: Medicare Other | Admitting: Physician Assistant

## 2020-01-25 ENCOUNTER — Other Ambulatory Visit: Payer: Self-pay

## 2020-01-25 VITALS — BP 152/83 | HR 77 | Ht 70.0 in | Wt 188.9 lb

## 2020-01-25 DIAGNOSIS — N952 Postmenopausal atrophic vaginitis: Secondary | ICD-10-CM

## 2020-01-25 DIAGNOSIS — R300 Dysuria: Secondary | ICD-10-CM

## 2020-01-25 DIAGNOSIS — R3915 Urgency of urination: Secondary | ICD-10-CM | POA: Diagnosis not present

## 2020-01-25 LAB — MICROSCOPIC EXAMINATION: WBC, UA: >30 /hpf — AB (ref 0–5)

## 2020-01-25 LAB — URINALYSIS, COMPLETE
Bilirubin, UA: NEGATIVE
Glucose, UA: NEGATIVE
Ketones, UA: NEGATIVE
Nitrite, UA: NEGATIVE
RBC, UA: NEGATIVE
Specific Gravity, UA: 1.02 (ref 1.005–1.030)
Urobilinogen, Ur: 0.2 mg/dL (ref 0.2–1.0)
pH, UA: 5.5 (ref 5.0–7.5)

## 2020-01-25 MED ORDER — MIRABEGRON ER 50 MG PO TB24: 50.0000 mg | ORAL_TABLET | Freq: Every day | ORAL | 0 refills | Status: DC

## 2020-01-25 MED ORDER — PREMARIN 0.625 MG/GM VA CREA: TOPICAL_CREAM | VAGINAL | 0 refills | Status: DC

## 2020-01-25 MED ORDER — NITROFURANTOIN MONOHYD MACRO 100 MG PO CAPS: 100.0000 mg | ORAL_CAPSULE | Freq: Two times a day (BID) | ORAL | 0 refills | Status: AC

## 2020-01-25 NOTE — Progress Notes (Signed)
In and Out Catheterization  Patient is present today for a I & O catheterization due to urinary frequency. Patient was cleaned and prepped in a sterile fashion with betadine . A 14FR cath was inserted no complications were noted , 37ml of urine return was noted, urine was yellow in color. A clean urine sample was collected for UA and culture. Bladder was drained and catheter was removed without difficulty.    Performed by: Debroah Loop, PA-C

## 2020-01-25 NOTE — Progress Notes (Signed)
01/25/2020 2:27 PM   Vanessa Esparza 1940/07/20 [id number]  CC: Chief Complaint  Patient presents with  . Dysuria    HPI: Vanessa Esparza is a 79 y.o. 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 She is an established [facility] patient who last saw Dr. Bernardo Esparza on 03/25/2019 for annual follow-up of the above.    In the interim, she underwent bilateral laparoscopic salpingo-oophorectomy on 04/12/2019 for management of an incidental cystic left adnexal mass.  Surgical pathology negative for atypia and malignancy.  Today she reports a 4-day history of dysuria and urinary frequency.  She has not taken any medication at home for management of her symptoms. She reports urgency, frequency, urge incontinence, and stress incontinence at baseline.  She has never seen a urologist for management of the symptoms.  She wears pads daily for management of her urinary leakage.  She reports approximately 1 UTI annually, most recently in May 2021 and treated at Dr. Barbarann Ehlers office.  On chart review, UA at that visit was notable for 10-50 WBCs/hpf and rare bacteria; urine culture resulted with mixed urogenital flora.  In-office catheterized UA today positive for 1+ protein and trace leukocyte esterase; urine microscopy with >30 WBCs/HPF.  PMH: Past Medical History:  Diagnosis Date  . Anemia   . Arthritis   . Cancer (HCC)    RENAL CELL  . Diabetes mellitus without complication ([place])   . GERD (gastroesophageal reflux disease)   . Gout   . Hyperlipidemia   . Hypertension   . Hypothyroidism   . Meningioma ([place])   . Psoriasis   . Seizure ([place])   . Seizures ([place])    LAST SEIZURE ON 02-08-09    Surgical History: Past Surgical History:  Procedure Laterality Date  . ABDOMINAL HYSTERECTOMY    . BACK SURGERY     LUMBAR  . BRAIN MENINGIOMA EXCISION    . BRAIN SURGERY     CRANIOTOMY WITH EXCISION OF SUPRATENTORIAL  MENINGIOMA  . COLONOSCOPY    . COLONOSCOPY WITH PROPOFOL N/A 01/28/2017   Procedure: COLONOSCOPY WITH PROPOFOL;  Surgeon: Vanessa Sails, MD;  Location: [facility] ENDOSCOPY;  Service: Endoscopy;  Laterality: N/A;  . ESOPHAGOGASTRODUODENOSCOPY    . ESOPHAGOGASTRODUODENOSCOPY (EGD) WITH PROPOFOL N/A 01/28/2017   Procedure: ESOPHAGOGASTRODUODENOSCOPY (EGD) WITH PROPOFOL;  Surgeon: Vanessa Sails, MD;  Location: [facility] ENDOSCOPY;  Service: Endoscopy;  Laterality: N/A;  . HEMORRHOID SURGERY    . KNEE ARTHROSCOPY Right 09/21/2015   Procedure: ARTHROSCOPY KNEE, LATERAL AND MEDIAL MENISECTOMY;  Surgeon: Vanessa Knows, MD;  Location: [facility] ORS;  Service: Orthopedics;  Laterality: Right;  . LAPAROSCOPIC BILATERAL SALPINGO OOPHERECTOMY Bilateral 04/12/2019   Procedure: LAPAROSCOPIC BILATERAL SALPINGO OOPHORECTOMY;  Surgeon: Ward, Honor Loh, MD;  Location: [facility] ORS;  Service: Gynecology;  Laterality: Bilateral;  . PARTIAL NEPHRECTOMY Left    DUE TO RENAL CELL CARCINOMA  . REDUCTION MAMMAPLASTY Bilateral   . THYROID SURGERY    . TOTAL KNEE ARTHROPLASTY Right 06/30/2018   Procedure: TOTAL KNEE ARTHROPLASTY-RIGHT;  Surgeon: Vanessa Knows, MD;  Location: [facility] ORS;  Service: Orthopedics;  Laterality: Right;    Home Medications:  Allergies as of 01/25/2020      Reactions   Arava [leflunomide]    Swelling tongue and ulcers in mouth    Methotrexate Derivatives Swelling   Tongue swelling   Statins Other (See Comments)   BLOATED FEELING, CHEST FULLNESS      Medication List  Accurate as of January 25, 2020  2:27 PM. If you have any questions, ask your nurse or doctor.        allopurinol 100 MG tablet Commonly known as: ZYLOPRIM Take 100 mg by mouth daily.   calcium carbonate 600 MG Tabs tablet Commonly known as: OS-CAL Take 600 mg by mouth daily.   clobetasol 0.05 % external solution Commonly known as: TEMOVATE Apply topically 2 (two) times daily.   fexofenadine 180 MG tablet Commonly  known as: ALLEGRA Take 180 mg by mouth daily.   Fluocinolone Acetonide Scalp 0.01 % Oil Apply topically.   fluocinonide 0.05 % external solution Commonly known as: LIDEX Apply topically 2 (two) times daily as needed.   folic acid 093 MCG tablet Commonly known as: FOLVITE Take 800 mcg by mouth daily as needed (upset stomach).   glipiZIDE 5 MG tablet Commonly known as: GLUCOTROL Take 5 mg by mouth daily before breakfast.   glucose blood test strip USE 1 STRIP TO CHECK GLUCOSE TWICE DAILY   ibuprofen 800 MG tablet Commonly known as: ADVIL Take 1 tablet (800 mg total) by mouth every 6 (six) hours.   levETIRAcetam 500 MG tablet Commonly known as: KEPPRA Take 500 mg by mouth 2 (two) times daily.   levothyroxine 150 MCG tablet Commonly known as: SYNTHROID Take 150 mcg by mouth daily before breakfast.   lisinopril-hydrochlorothiazide 20-25 MG tablet Commonly known as: ZESTORETIC Take 1 tablet by mouth daily.   omeprazole 40 MG capsule Commonly known as: PRILOSEC Take 40 mg by mouth daily.   oxyCODONE 5 MG immediate release tablet Commonly known as: Roxicodone Take 1 tablet (5 mg total) by mouth every 4 (four) hours as needed.       Allergies:  Allergies  Allergen Reactions  . Arava [Leflunomide]     Swelling tongue and ulcers in mouth   . Methotrexate Derivatives Swelling    Tongue swelling  . Statins Other (See Comments)    BLOATED FEELING, CHEST FULLNESS    Family History: Family History  Problem Relation Age of Onset  . Breast cancer Other   . Breast cancer Mother     Social History:   reports that she has never smoked. She has never used smokeless tobacco. She reports that she does not drink alcohol and does not use drugs.  Physical Exam: BP (!) 152/83 (BP Location: Left Arm, Patient Position: Sitting, Cuff Size: Normal)   Pulse 77   Ht 5\' 10"  (1.778 m)   Wt 188 lb 14.4 oz (85.7 kg)   BMI 27.10 kg/m   Constitutional:  Alert and oriented, no  acute distress, nontoxic appearing HEENT: [place], AT Cardiovascular: No clubbing, cyanosis, or edema Respiratory: Normal respiratory effort, no increased work of breathing GU: Glossy appearance of the labia minora. Urethral caruncle noted. Skin: No rashes, bruises or suspicious lesions Neurologic: Grossly intact, no focal deficits, moving all 4 extremities Psychiatric: Normal mood and affect  Laboratory Data: Results for orders placed or performed in visit on 01/25/20  Microscopic Examination   Urine  Result Value Ref Range   WBC, UA >30 (A) 0 - 5 /hpf   RBC 0-2 0 - 2 /hpf   Epithelial Cells (non renal) 0-10 0 - 10 /hpf   Casts Present (A) None seen /lpf   Cast Type Hyaline casts N/A   Bacteria, UA Few None seen/Few  Urinalysis, Complete  Result Value Ref Range   Specific Gravity, UA 1.020 1.005 - 1.030   pH, UA 5.5 5.0 -  7.5   Color, UA Yellow Yellow   Appearance Ur Cloudy (A) Clear   Leukocytes,UA Trace (A) Negative   Protein,UA 1+ (A) Negative/Trace   Glucose, UA Negative Negative   Ketones, UA Negative Negative   RBC, UA Negative Negative   Bilirubin, UA Negative Negative   Urobilinogen, Ur 0.2 0.2 - 1.0 mg/dL   Nitrite, UA Negative Negative   Microscopic Examination See below:    Assessment & Plan:   1. Dysuria UA today notable for pyuria greater than prior.  Will start empiric Macrobid 100 mg twice daily x5 days and send urine for culture for further evaluation.  If urine culture is negative, I suspect her dysuria may be secondary to #3 below. - Urinalysis, Complete - CULTURE, URINE COMPREHENSIVE - nitrofurantoin, macrocrystal-monohydrate, (MACROBID) 100 MG capsule; Take 1 capsule (100 mg total) by mouth every 12 (twelve) hours for 5 days.  Dispense: 10 capsule; Refill: 0  2. Urinary urgency Patient reports urgency, frequency, urge incontinence, and stress incontinence at baseline.  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  I offered her a trial  of Myrbetriq 50 mg daily with plans for symptom recheck in 1 month.  I explained that this medication is not expected to improve her stress incontinence, however it may offer her symptomatic relief of her urge and frequency symptoms.  She expressed understanding. - mirabegron ER (MYRBETRIQ) 50 MG TB24 tablet; Take 1 tablet (50 mg total) by mouth daily.  Dispense: 28 tablet; Refill: 0  3. Atrophic vaginitis Atrophic appearing vulva on physical exam today with urethral carbuncle noted.  No pertinent gynecologic malignancy history.  I offered her topical vaginal estrogen cream with the goal of decreasing dysuria and infection frequency.  Premarin sample provided today. - conjugated estrogens (PREMARIN) vaginal cream; Apply one pea-sized amount around the opening of the urethra three times weekly.  Dispense: 4 g; Refill: 0   Return in about 4 weeks (around 02/22/2020) for Symptom recheck with PVR.  Debroah Loop, PA-C  [facility] [address] [place], [place] [address] [phone]

## 2020-01-25 NOTE — Patient Instructions (Signed)
Start Myrbetriq 50mg  daily. We'll plan to see you back in clinic in 1 month to recheck your symptoms on this medication.  Please start topical vaginal estrogen cream three times weekly. Apply one pea-sized amount around the opening of the urethra.

## 2020-01-29 LAB — CULTURE, URINE COMPREHENSIVE

## 2020-02-03 ENCOUNTER — Telehealth: Payer: Self-pay | Admitting: Physician Assistant

## 2020-02-03 MED ORDER — SULFAMETHOXAZOLE-TRIMETHOPRIM 800-160 MG PO TABS: 1.0000 | ORAL_TABLET | Freq: Two times a day (BID) | ORAL | 0 refills | Status: AC

## 2020-02-03 NOTE — Telephone Encounter (Signed)
Notified patient as advised, patient verbalized understanding.  

## 2020-02-03 NOTE — Telephone Encounter (Signed)
Please contact the patient and inform her that her urine culture has finalized.  The bacteria that she is growing is not completely responsive to the [place] I prescribed.  I would like to prescribe an additional antibiotic to make sure she clears this infection.  I sent a prescription for Bactrim DS twice daily x5 days to the [facility] on [address].

## 2020-02-23 IMAGING — MG MM DIGITAL SCREENING BILAT W/ CAD
4 series · 4 of 4 positions shown · non-contrast
Comparison: Previous exam(s).

CLINICAL DATA: Screening.

EXAM:
DIGITAL SCREENING BILATERAL MAMMOGRAM WITH CAD

[L MLO]
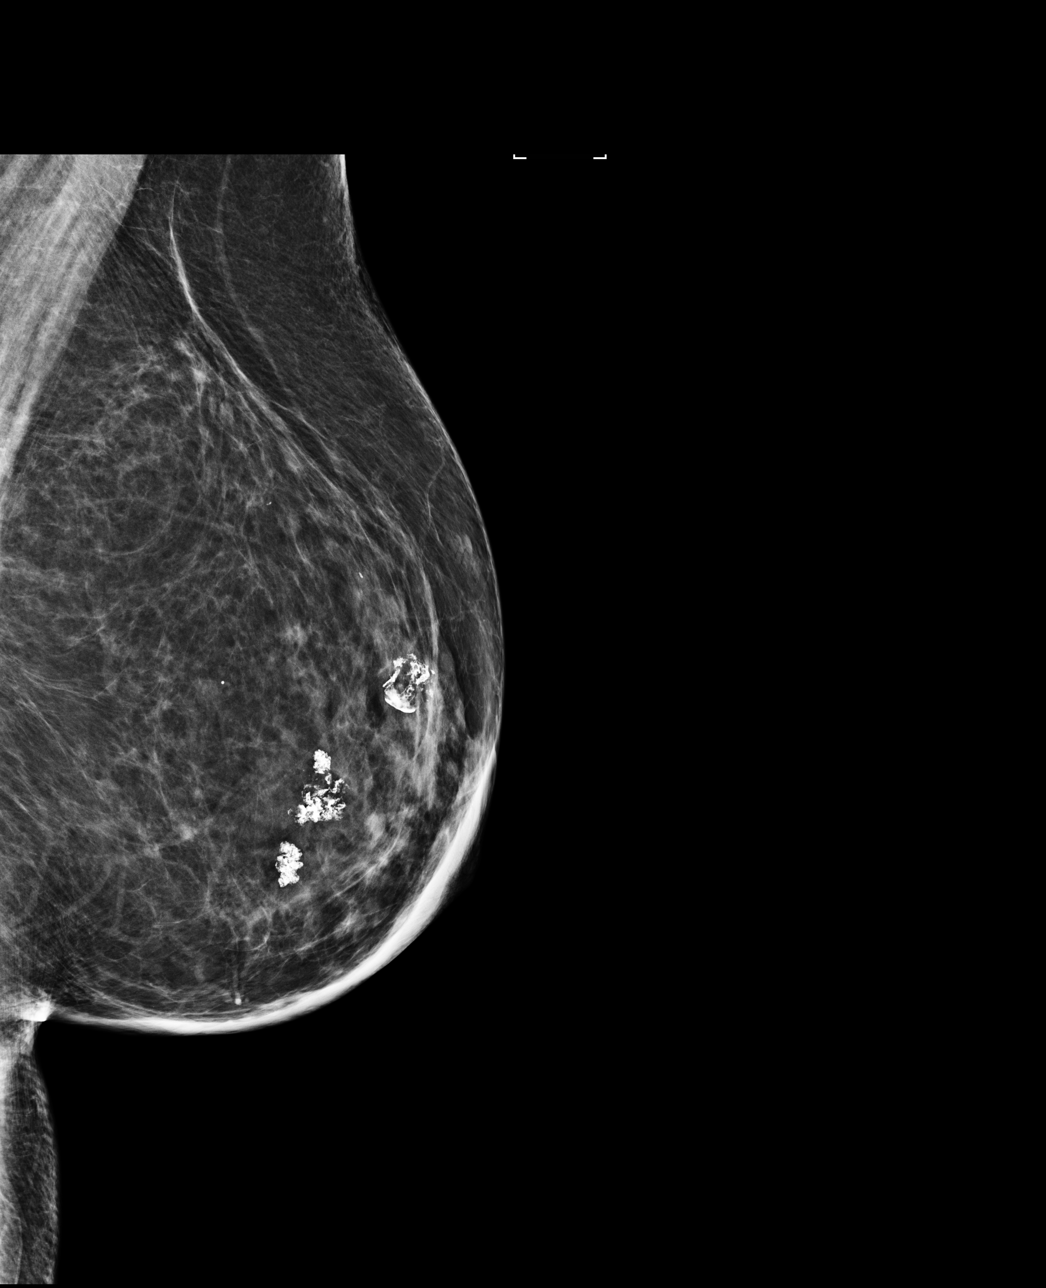

[R MLO]
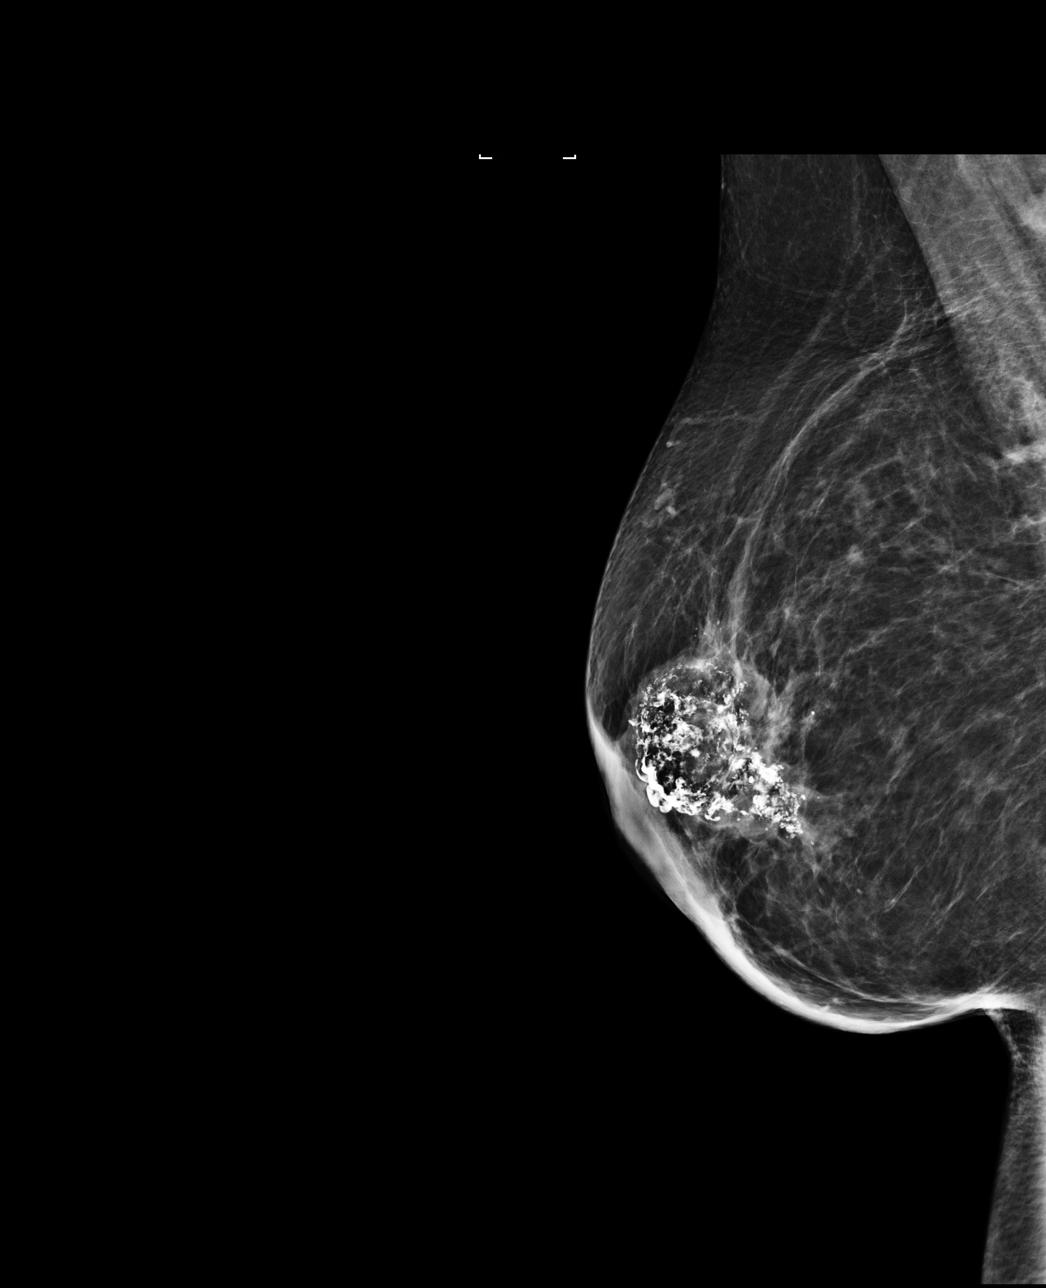

[L CC]
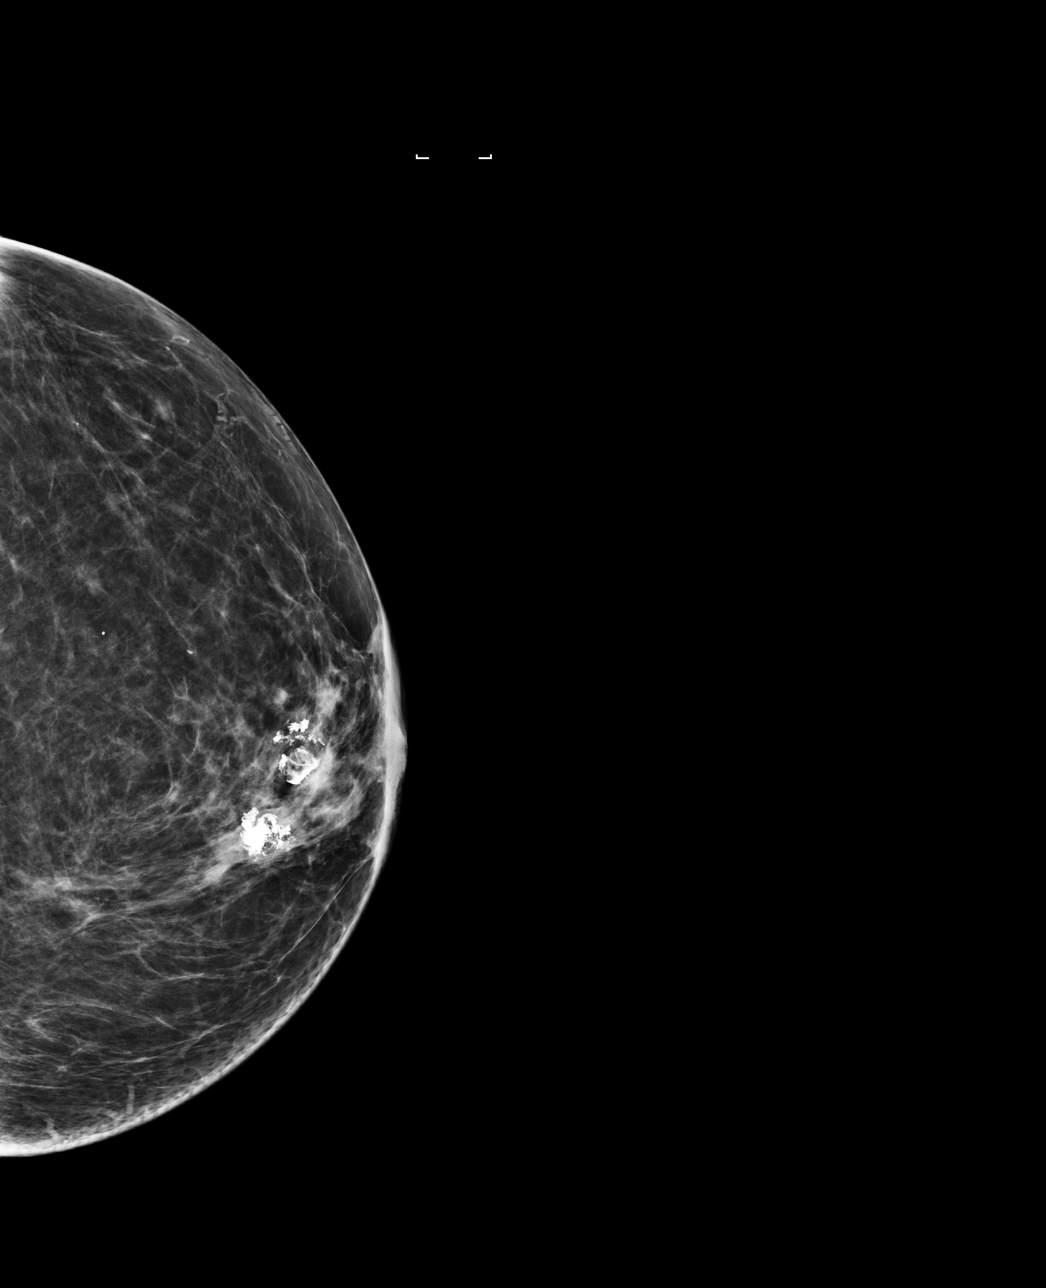

[R CC]
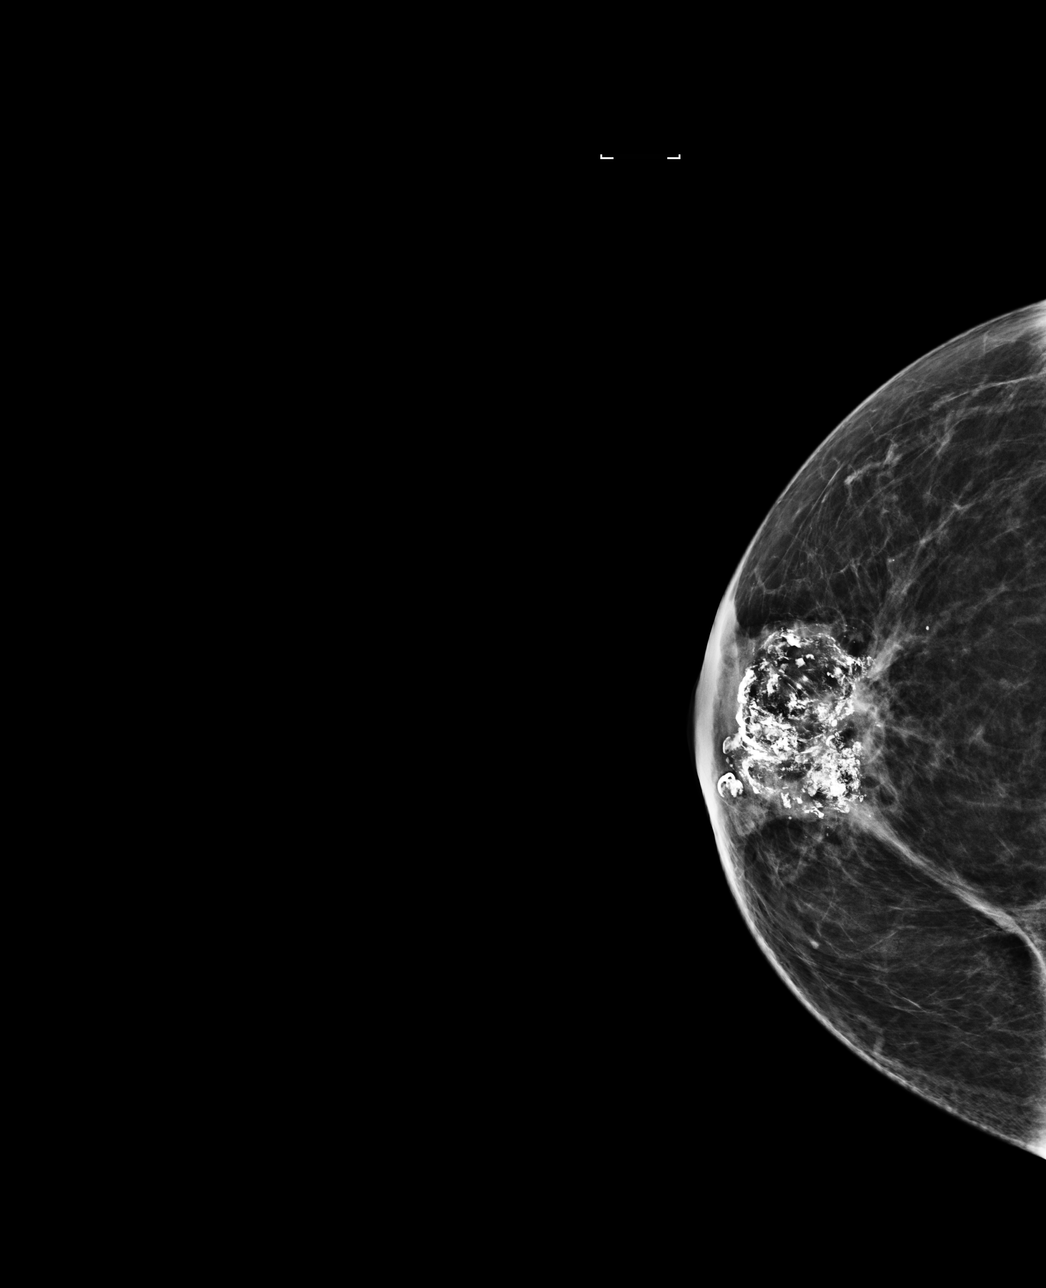

[4 of 4 positions shown; findings below may reference images not displayed]

ACR Breast Density Category b: There are scattered areas of
fibroglandular density.
FINDINGS: There are no findings suspicious for malignancy. Images were
processed with CAD.
IMPRESSION: No mammographic evidence of malignancy. A result letter of this
screening mammogram will be mailed directly to the patient.

RECOMMENDATION:
Screening mammogram in one year. (Code:AS-G-LCT)

BI-RADS CATEGORY  1: Negative.

## 2020-02-24 ENCOUNTER — Encounter: Payer: Self-pay | Admitting: Physician Assistant

## 2020-02-24 ENCOUNTER — Other Ambulatory Visit: Payer: Self-pay

## 2020-02-24 ENCOUNTER — Ambulatory Visit (INDEPENDENT_AMBULATORY_CARE_PROVIDER_SITE_OTHER): Payer: Medicare Other | Admitting: Physician Assistant

## 2020-02-24 VITALS — BP 173/82 | HR 64 | Ht 70.0 in | Wt 187.0 lb

## 2020-02-24 DIAGNOSIS — N952 Postmenopausal atrophic vaginitis: Secondary | ICD-10-CM | POA: Diagnosis not present

## 2020-02-24 DIAGNOSIS — R3915 Urgency of urination: Secondary | ICD-10-CM | POA: Diagnosis not present

## 2020-02-24 LAB — BLADDER SCAN AMB NON-IMAGING

## 2020-02-24 MED ORDER — MIRABEGRON ER 25 MG PO TB24: 25.0000 mg | ORAL_TABLET | Freq: Every day | ORAL | 0 refills | Status: DC

## 2020-02-24 NOTE — Progress Notes (Signed)
02/24/2020 1:41 PM   [name] [birth year] [id number]  CC: Chief Complaint  Patient presents with  . Follow-up    HPI: Vanessa Esparza is a 79 y.o. 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  Today she reports improvement in her urgency, frequency, and urge incontinence on Myrbetriq. She believes the medication "controls her bladder more," however she continues to have some urinary leakage. She is voiding every 2-3 hours during the day and approximately every 3-4 hours overnight. She does note a potential side effect of intermittent dizziness on this medication.   Additionally, she reports having used topical vaginal estrogen cream several times after her most recent visit, however she had flushed facies and hot flashes and has elected not to continue it.  PVR 37mL.  PMH: Past Medical History:  Diagnosis Date  . Anemia   . Arthritis   . Cancer (HCC)    RENAL CELL  . Diabetes mellitus without complication ([place])   . GERD (gastroesophageal reflux disease)   . Gout   . Hyperlipidemia   . Hypertension   . Hypothyroidism   . Meningioma ([place])   . Psoriasis   . Seizure ([place])   . Seizures ([place])    LAST SEIZURE ON 02-08-09    Surgical History: Past Surgical History:  Procedure Laterality Date  . ABDOMINAL HYSTERECTOMY    . BACK SURGERY     LUMBAR  . BRAIN MENINGIOMA EXCISION    . BRAIN SURGERY     CRANIOTOMY WITH EXCISION OF SUPRATENTORIAL MENINGIOMA  . COLONOSCOPY    . COLONOSCOPY WITH PROPOFOL N/A 01/28/2017   Procedure: COLONOSCOPY WITH PROPOFOL;  Surgeon: Lollie Sails, MD;  Location: [facility] ENDOSCOPY;  Service: Endoscopy;  Laterality: N/A;  . ESOPHAGOGASTRODUODENOSCOPY    . ESOPHAGOGASTRODUODENOSCOPY (EGD) WITH PROPOFOL N/A 01/28/2017   Procedure: ESOPHAGOGASTRODUODENOSCOPY (EGD) WITH PROPOFOL;  Surgeon: Lollie Sails,  MD;  Location: [facility] ENDOSCOPY;  Service: Endoscopy;  Laterality: N/A;  . HEMORRHOID SURGERY    . KNEE ARTHROSCOPY Right 09/21/2015   Procedure: ARTHROSCOPY KNEE, LATERAL AND MEDIAL MENISECTOMY;  Surgeon: Hessie Knows, MD;  Location: [facility] ORS;  Service: Orthopedics;  Laterality: Right;  . LAPAROSCOPIC BILATERAL SALPINGO OOPHERECTOMY Bilateral 04/12/2019   Procedure: LAPAROSCOPIC BILATERAL SALPINGO OOPHORECTOMY;  Surgeon: Ward, Honor Loh, MD;  Location: [facility] ORS;  Service: Gynecology;  Laterality: Bilateral;  . PARTIAL NEPHRECTOMY Left    DUE TO RENAL CELL CARCINOMA  . REDUCTION MAMMAPLASTY Bilateral   . THYROID SURGERY    . TOTAL KNEE ARTHROPLASTY Right 06/30/2018   Procedure: TOTAL KNEE ARTHROPLASTY-RIGHT;  Surgeon: Hessie Knows, MD;  Location: [facility] ORS;  Service: Orthopedics;  Laterality: Right;    Home Medications:  Allergies as of 02/24/2020      Reactions   Arava [leflunomide]    Swelling tongue and ulcers in mouth    Methotrexate Derivatives Swelling   Tongue swelling   Statins Other (See Comments)   BLOATED FEELING, CHEST FULLNESS      Medication List       Accurate as of February 24, 2020  1:41 PM. If you have any questions, ask your nurse or doctor.        allopurinol 100 MG tablet Commonly known as: ZYLOPRIM Take 100 mg by mouth daily.   calcium carbonate 600 MG Tabs tablet Commonly known as: OS-CAL Take 600 mg by mouth daily.  clobetasol 0.05 % external solution Commonly known as: TEMOVATE Apply topically 2 (two) times daily.   fexofenadine 180 MG tablet Commonly known as: ALLEGRA Take 180 mg by mouth daily.   Fluocinolone Acetonide Scalp 0.01 % Oil Apply topically.   fluocinonide 0.05 % external solution Commonly known as: LIDEX Apply topically 2 (two) times daily as needed.   folic acid 416 MCG tablet Commonly known as: FOLVITE Take 800 mcg by mouth daily as needed (upset stomach).   glipiZIDE 5 MG tablet Commonly known as: GLUCOTROL Take 5 mg  by mouth daily before breakfast.   glucose blood test strip USE 1 STRIP TO CHECK GLUCOSE TWICE DAILY   ibuprofen 800 MG tablet Commonly known as: ADVIL Take 1 tablet (800 mg total) by mouth every 6 (six) hours.   levETIRAcetam 500 MG tablet Commonly known as: KEPPRA Take 500 mg by mouth 2 (two) times daily.   levothyroxine 150 MCG tablet Commonly known as: SYNTHROID Take 150 mcg by mouth daily before breakfast.   lisinopril-hydrochlorothiazide 20-25 MG tablet Commonly known as: ZESTORETIC Take 1 tablet by mouth daily.   mirabegron ER 50 MG Tb24 tablet Commonly known as: MYRBETRIQ Take 1 tablet (50 mg total) by mouth daily.   omeprazole 40 MG capsule Commonly known as: PRILOSEC Take 40 mg by mouth daily.   oxyCODONE 5 MG immediate release tablet Commonly known as: Roxicodone Take 1 tablet (5 mg total) by mouth every 4 (four) hours as needed.   Premarin vaginal cream Generic drug: conjugated estrogens Apply one pea-sized amount around the opening of the urethra three times weekly.       Allergies:  Allergies  Allergen Reactions  . Arava [Leflunomide]     Swelling tongue and ulcers in mouth   . Methotrexate Derivatives Swelling    Tongue swelling  . Statins Other (See Comments)    BLOATED FEELING, CHEST FULLNESS    Family History: Family History  Problem Relation Age of Onset  . Breast cancer Other   . Breast cancer Mother     Social History:   reports that she has never smoked. She has never used smokeless tobacco. She reports that she does not drink alcohol and does not use drugs.  Physical Exam: BP (!) 173/82 (BP Location: Left Arm, Patient Position: Sitting, Cuff Size: Normal)   Pulse 64   Ht 5\' 10"  (1.778 m)   Wt 187 lb (84.8 kg)   BMI 26.83 kg/m   Constitutional:  Alert and oriented, no acute distress, nontoxic appearing HEENT: , AT Cardiovascular: No clubbing, cyanosis, or edema Respiratory: Normal respiratory effort, no increased work of  breathing Skin: No rashes, bruises or suspicious lesions Neurologic: Grossly intact, no focal deficits, moving all 4 extremities Psychiatric: Normal mood and affect  Laboratory Data: Results for orders placed or performed in visit on 02/24/20  Bladder Scan (Post Void Residual) in office  Result Value Ref Range   Scan Result 3mL    Assessment & Plan:   1. Urinary urgency Improvement in OAB wet symptoms on Myrbetriq 50 mg, however she has developed a side effect of dizziness. I would like to decrease her daily dose to 25 mg daily to see if this will eliminate adverse side effects while maintaining therapeutic benefit. Samples provided today. I counseled the patient to contact me if the dizziness persists longer than 1 week on the new dose of medication, at which point we will switch her to an alternative. Patient is in agreement with this plan. -  Bladder Scan (Post Void Residual) in office - mirabegron ER (MYRBETRIQ) 25 MG TB24 tablet; Take 1 tablet (25 mg total) by mouth daily.  Dispense: 28 tablet; Refill: 0  2. Atrophic vaginitis She has discontinued topical vaginal estrogen cream secondary to hot flashes and flushed facies. I am in agreement with this plan.  Return in about 4 weeks (around 03/23/2020) for Symptom recheck and PVR with Dr. Bernardo Heater (already scheduled).  Debroah Loop, PA-C  [facility] [address] [place], [place] [address] [phone]

## 2020-03-24 ENCOUNTER — Other Ambulatory Visit: Payer: Self-pay

## 2020-03-24 ENCOUNTER — Encounter: Payer: Self-pay | Admitting: Urology

## 2020-03-24 ENCOUNTER — Ambulatory Visit: Payer: Medicare Other | Admitting: Urology

## 2020-03-24 VITALS — BP 117/66 | HR 73 | Ht 70.0 in | Wt 175.0 lb

## 2020-03-24 DIAGNOSIS — N3281 Overactive bladder: Secondary | ICD-10-CM

## 2020-03-24 DIAGNOSIS — Z85528 Personal history of other malignant neoplasm of kidney: Secondary | ICD-10-CM | POA: Diagnosis not present

## 2020-03-24 DIAGNOSIS — N3941 Urge incontinence: Secondary | ICD-10-CM | POA: Diagnosis not present

## 2020-03-24 DIAGNOSIS — R3915 Urgency of urination: Secondary | ICD-10-CM

## 2020-03-24 LAB — BLADDER SCAN AMB NON-IMAGING: Scan Result: 0

## 2020-03-24 MED ORDER — MIRABEGRON ER 25 MG PO TB24: 25.0000 mg | ORAL_TABLET | Freq: Every day | ORAL | 3 refills | Status: DC

## 2020-03-24 NOTE — Progress Notes (Signed)
03/24/2020 10:41 AM   Vanessa Esparza 08-Aug-1940 [id number]  Referring provider: [name], MD [address] [facility] [place],  [place] [address]  Chief Complaint  Patient presents with  . Other    Urologic history: 1. pT1arenal cell carcinoma -Left partial nephrectomy 05/2011 at [facility] grade 2; clear-cell   HPI: 79 y.o. female presents for annual follow-up.   Since last years visit saw Thomas Hoff for dysuria and urinary frequency  Urine culture grew Klebsiella and treated with antibiotics  Was also placed on Myrbetriq 50 mg for OAB with urge incontinence  She return last month for recheck and PVR  Felt her frequency urgency and urge incontinence improved her OAB symptoms and was also having intermediate dizziness  Myrbetriq dosage decreased to 25 mg  She does feel her OAB symptoms have significantly improved and desires to continue medication.  Currently without complaints of dizziness    PMH: Past Medical History:  Diagnosis Date  . Anemia   . Arthritis   . Cancer (HCC)    RENAL CELL  . Diabetes mellitus without complication ([place])   . GERD (gastroesophageal reflux disease)   . Gout   . Hyperlipidemia   . Hypertension   . Hypothyroidism   . Meningioma ([place])   . Psoriasis   . Seizure ([place])   . Seizures ([place])    LAST SEIZURE ON 02-08-09    Surgical History: Past Surgical History:  Procedure Laterality Date  . ABDOMINAL HYSTERECTOMY    . BACK SURGERY     LUMBAR  . BRAIN MENINGIOMA EXCISION    . BRAIN SURGERY     CRANIOTOMY WITH EXCISION OF SUPRATENTORIAL MENINGIOMA  . COLONOSCOPY    . COLONOSCOPY WITH PROPOFOL N/A 01/28/2017   Procedure: COLONOSCOPY WITH PROPOFOL;  Surgeon: Lollie Sails, MD;  Location: [facility] ENDOSCOPY;  Service: Endoscopy;  Laterality: N/A;  . ESOPHAGOGASTRODUODENOSCOPY    . ESOPHAGOGASTRODUODENOSCOPY (EGD) WITH PROPOFOL N/A 01/28/2017   Procedure: ESOPHAGOGASTRODUODENOSCOPY (EGD) WITH PROPOFOL;   Surgeon: Lollie Sails, MD;  Location: [facility] ENDOSCOPY;  Service: Endoscopy;  Laterality: N/A;  . HEMORRHOID SURGERY    . KNEE ARTHROSCOPY Right 09/21/2015   Procedure: ARTHROSCOPY KNEE, LATERAL AND MEDIAL MENISECTOMY;  Surgeon: Hessie Knows, MD;  Location: [facility] ORS;  Service: Orthopedics;  Laterality: Right;  . LAPAROSCOPIC BILATERAL SALPINGO OOPHERECTOMY Bilateral 04/12/2019   Procedure: LAPAROSCOPIC BILATERAL SALPINGO OOPHORECTOMY;  Surgeon: Ward, Honor Loh, MD;  Location: [facility] ORS;  Service: Gynecology;  Laterality: Bilateral;  . PARTIAL NEPHRECTOMY Left    DUE TO RENAL CELL CARCINOMA  . REDUCTION MAMMAPLASTY Bilateral   . THYROID SURGERY    . TOTAL KNEE ARTHROPLASTY Right 06/30/2018   Procedure: TOTAL KNEE ARTHROPLASTY-RIGHT;  Surgeon: Hessie Knows, MD;  Location: [facility] ORS;  Service: Orthopedics;  Laterality: Right;    Home Medications:  Allergies as of 03/24/2020      Reactions   Arava [leflunomide]    Swelling tongue and ulcers in mouth    Methotrexate Derivatives Swelling   Tongue swelling   Statins Other (See Comments)   BLOATED FEELING, CHEST FULLNESS      Medication List       Accurate as of March 24, 2020 10:41 AM. If you have any questions, ask your nurse or doctor.        allopurinol 100 MG tablet Commonly known as: ZYLOPRIM Take 100 mg by mouth daily.   calcium carbonate 600 MG Tabs tablet Commonly known as: OS-CAL Take 600 mg by mouth  daily.   clobetasol 0.05 % external solution Commonly known as: TEMOVATE Apply topically 2 (two) times daily.   fexofenadine 180 MG tablet Commonly known as: ALLEGRA Take 180 mg by mouth daily.   Fluocinolone Acetonide Scalp 0.01 % Oil Apply topically.   fluocinonide 0.05 % external solution Commonly known as: LIDEX Apply topically 2 (two) times daily as needed.   folic acid 315 MCG tablet Commonly known as: FOLVITE Take 800 mcg by mouth daily as needed (upset stomach).   glipiZIDE 5 MG tablet Commonly  known as: GLUCOTROL Take 5 mg by mouth daily before breakfast.   glucose blood test strip USE 1 STRIP TO CHECK GLUCOSE TWICE DAILY   ibuprofen 800 MG tablet Commonly known as: ADVIL Take 1 tablet (800 mg total) by mouth every 6 (six) hours.   levETIRAcetam 500 MG tablet Commonly known as: KEPPRA Take 500 mg by mouth 2 (two) times daily.   levothyroxine 150 MCG tablet Commonly known as: SYNTHROID Take 150 mcg by mouth daily before breakfast.   lisinopril-hydrochlorothiazide 20-25 MG tablet Commonly known as: ZESTORETIC Take 1 tablet by mouth daily.   mirabegron ER 25 MG Tb24 tablet Commonly known as: MYRBETRIQ Take 1 tablet (25 mg total) by mouth daily.   omeprazole 40 MG capsule Commonly known as: PRILOSEC Take 40 mg by mouth daily.   oxyCODONE 5 MG immediate release tablet Commonly known as: Roxicodone Take 1 tablet (5 mg total) by mouth every 4 (four) hours as needed.   Premarin vaginal cream Generic drug: conjugated estrogens Apply one pea-sized amount around the opening of the urethra three times weekly.       Allergies:  Allergies  Allergen Reactions  . Arava [Leflunomide]     Swelling tongue and ulcers in mouth   . Methotrexate Derivatives Swelling    Tongue swelling  . Statins Other (See Comments)    BLOATED FEELING, CHEST FULLNESS    Family History: Family History  Problem Relation Age of Onset  . Breast cancer Other   . Breast cancer Mother     Social History:  reports that she has never smoked. She has never used smokeless tobacco. She reports that she does not drink alcohol and does not use drugs.   Physical Exam: BP 117/66   Pulse 73   Ht 5\' 10"  (1.778 m)   Wt 175 lb (79.4 kg)   BMI 25.11 kg/m   Constitutional:  Alert and oriented, No acute distress. HEENT: [place] AT, moist mucus membranes.  Trachea midline, no masses. Cardiovascular: No clubbing, cyanosis, or edema. Respiratory: Normal respiratory effort, no increased work of  breathing.   Assessment & Plan:    1.  Overactive bladder with urge incontinence  Bladder scan PVR 0 mL  Symptoms improved on Myrbetriq 25 mg without side effects  Rx sent to pharmacy  Continue annual follow-up  2.  History renal cell carcinoma    Abbie Sons, MD  [facility] [address] [place], [place] [address] [phone]

## 2020-08-14 ENCOUNTER — Other Ambulatory Visit: Payer: Self-pay | Admitting: Gastroenterology

## 2020-08-14 ENCOUNTER — Other Ambulatory Visit (HOSPITAL_COMMUNITY): Payer: Self-pay | Admitting: Gastroenterology

## 2020-08-14 DIAGNOSIS — K219 Gastro-esophageal reflux disease without esophagitis: Secondary | ICD-10-CM

## 2020-08-14 DIAGNOSIS — R630 Anorexia: Secondary | ICD-10-CM

## 2020-08-14 DIAGNOSIS — R634 Abnormal weight loss: Secondary | ICD-10-CM

## 2020-08-14 DIAGNOSIS — R140 Abdominal distension (gaseous): Secondary | ICD-10-CM

## 2020-08-30 ENCOUNTER — Other Ambulatory Visit: Payer: Self-pay

## 2020-08-30 ENCOUNTER — Ambulatory Visit
Admission: RE | Admit: 2020-08-30 | Discharge: 2020-08-30 | Disposition: A | Discharge status: Home / Self Care | Payer: Medicare Other | Source: Ambulatory Visit | Attending: Gastroenterology | Admitting: Gastroenterology

## 2020-08-30 DIAGNOSIS — R140 Abdominal distension (gaseous): Secondary | ICD-10-CM | POA: Insufficient documentation

## 2020-08-30 DIAGNOSIS — R630 Anorexia: Secondary | ICD-10-CM | POA: Diagnosis present

## 2020-08-30 DIAGNOSIS — R634 Abnormal weight loss: Secondary | ICD-10-CM | POA: Diagnosis not present

## 2020-08-30 DIAGNOSIS — K219 Gastro-esophageal reflux disease without esophagitis: Secondary | ICD-10-CM | POA: Diagnosis present

## 2020-08-30 HISTORY — DX: Systemic involvement of connective tissue, unspecified: M35.9

## 2020-08-30 MED ORDER — IOHEXOL 300 MG/ML  SOLN
85.0000 mL | Freq: Once | INTRAMUSCULAR | Status: AC | PRN
  Administered 2020-08-30: 85 mL via INTRAVENOUS

## 2020-10-02 ENCOUNTER — Other Ambulatory Visit: Payer: Self-pay | Admitting: Family Medicine

## 2020-10-02 DIAGNOSIS — Z1231 Encounter for screening mammogram for malignant neoplasm of breast: Secondary | ICD-10-CM

## 2020-10-05 ENCOUNTER — Ambulatory Visit
Admission: RE | Admit: 2020-10-05 | Discharge: 2020-10-05 | Disposition: A | Discharge status: Home / Self Care | Payer: Medicare Other | Source: Ambulatory Visit | Attending: Family Medicine | Admitting: Family Medicine

## 2020-10-05 ENCOUNTER — Other Ambulatory Visit: Payer: Self-pay

## 2020-10-05 DIAGNOSIS — Z1231 Encounter for screening mammogram for malignant neoplasm of breast: Secondary | ICD-10-CM | POA: Insufficient documentation

## 2020-12-01 ENCOUNTER — Other Ambulatory Visit: Payer: Self-pay

## 2020-12-01 ENCOUNTER — Inpatient Hospital Stay
Admission: EM | Admit: 2020-12-01 | Discharge: 2020-12-03 | DRG: 193 | Disposition: A | Discharge status: Home Health | Payer: Medicare Other | Attending: Internal Medicine | Admitting: Internal Medicine

## 2020-12-01 ENCOUNTER — Encounter: Payer: Self-pay | Admitting: Emergency Medicine

## 2020-12-01 ENCOUNTER — Emergency Department: Payer: Medicare Other

## 2020-12-01 DIAGNOSIS — E7800 Pure hypercholesterolemia, unspecified: Secondary | ICD-10-CM | POA: Diagnosis present

## 2020-12-01 DIAGNOSIS — J189 Pneumonia, unspecified organism: Principal | ICD-10-CM | POA: Diagnosis present

## 2020-12-01 DIAGNOSIS — E861 Hypovolemia: Secondary | ICD-10-CM | POA: Diagnosis present

## 2020-12-01 DIAGNOSIS — Z79890 Hormone replacement therapy (postmenopausal): Secondary | ICD-10-CM

## 2020-12-01 DIAGNOSIS — Z79818 Long term (current) use of other agents affecting estrogen receptors and estrogen levels: Secondary | ICD-10-CM

## 2020-12-01 DIAGNOSIS — R410 Disorientation, unspecified: Secondary | ICD-10-CM | POA: Diagnosis not present

## 2020-12-01 DIAGNOSIS — M109 Gout, unspecified: Secondary | ICD-10-CM | POA: Diagnosis present

## 2020-12-01 DIAGNOSIS — R531 Weakness: Secondary | ICD-10-CM

## 2020-12-01 DIAGNOSIS — E119 Type 2 diabetes mellitus without complications: Secondary | ICD-10-CM | POA: Diagnosis present

## 2020-12-01 DIAGNOSIS — R55 Syncope and collapse: Secondary | ICD-10-CM | POA: Diagnosis present

## 2020-12-01 DIAGNOSIS — E871 Hypo-osmolality and hyponatremia: Secondary | ICD-10-CM | POA: Diagnosis present

## 2020-12-01 DIAGNOSIS — E039 Hypothyroidism, unspecified: Secondary | ICD-10-CM | POA: Diagnosis present

## 2020-12-01 DIAGNOSIS — Z87440 Personal history of urinary (tract) infections: Secondary | ICD-10-CM

## 2020-12-01 DIAGNOSIS — K219 Gastro-esophageal reflux disease without esophagitis: Secondary | ICD-10-CM | POA: Diagnosis present

## 2020-12-01 DIAGNOSIS — Z7984 Long term (current) use of oral hypoglycemic drugs: Secondary | ICD-10-CM

## 2020-12-01 DIAGNOSIS — M069 Rheumatoid arthritis, unspecified: Secondary | ICD-10-CM | POA: Diagnosis present

## 2020-12-01 DIAGNOSIS — G9341 Metabolic encephalopathy: Secondary | ICD-10-CM | POA: Diagnosis not present

## 2020-12-01 DIAGNOSIS — I10 Essential (primary) hypertension: Secondary | ICD-10-CM | POA: Diagnosis present

## 2020-12-01 DIAGNOSIS — Z888 Allergy status to other drugs, medicaments and biological substances status: Secondary | ICD-10-CM

## 2020-12-01 DIAGNOSIS — G40909 Epilepsy, unspecified, not intractable, without status epilepticus: Secondary | ICD-10-CM | POA: Diagnosis present

## 2020-12-01 DIAGNOSIS — Z20822 Contact with and (suspected) exposure to covid-19: Secondary | ICD-10-CM | POA: Diagnosis present

## 2020-12-01 DIAGNOSIS — Z905 Acquired absence of kidney: Secondary | ICD-10-CM

## 2020-12-01 DIAGNOSIS — Z85528 Personal history of other malignant neoplasm of kidney: Secondary | ICD-10-CM

## 2020-12-01 DIAGNOSIS — Z96651 Presence of right artificial knee joint: Secondary | ICD-10-CM | POA: Diagnosis present

## 2020-12-01 DIAGNOSIS — N390 Urinary tract infection, site not specified: Secondary | ICD-10-CM | POA: Diagnosis present

## 2020-12-01 DIAGNOSIS — Z79899 Other long term (current) drug therapy: Secondary | ICD-10-CM

## 2020-12-01 DIAGNOSIS — E860 Dehydration: Secondary | ICD-10-CM | POA: Diagnosis present

## 2020-12-01 LAB — CBC
HCT: 36.5 % (ref 36.0–46.0)
Hemoglobin: 11.9 g/dL — ABNORMAL LOW (ref 12.0–15.0)
MCH: 24.5 pg — ABNORMAL LOW (ref 26.0–34.0)
MCHC: 32.6 g/dL (ref 30.0–36.0)
MCV: 75.1 fL — ABNORMAL LOW (ref 80.0–100.0)
Platelets: 400 10*3/uL (ref 150–400)
RBC: 4.86 MIL/uL (ref 3.87–5.11)
RDW: 13.2 % (ref 11.5–15.5)
WBC: 6.3 10*3/uL (ref 4.0–10.5)
nRBC: 0 % (ref 0.0–0.2)

## 2020-12-01 LAB — HEPATIC FUNCTION PANEL
ALT: 45 U/L — ABNORMAL HIGH (ref 0–44)
AST: 53 U/L — ABNORMAL HIGH (ref 15–41)
Albumin: 4.2 g/dL (ref 3.5–5.0)
Alkaline Phosphatase: 65 U/L (ref 38–126)
Bilirubin, Direct: 0.1 mg/dL (ref 0.0–0.2)
Indirect Bilirubin: 0.8 mg/dL (ref 0.3–0.9)
Total Bilirubin: 0.9 mg/dL (ref 0.3–1.2)
Total Protein: 7.2 g/dL (ref 6.5–8.1)

## 2020-12-01 LAB — URINALYSIS, COMPLETE (UACMP) WITH MICROSCOPIC
Bilirubin Urine: NEGATIVE
Glucose, UA: NEGATIVE mg/dL
Ketones, ur: 20 mg/dL — AB
Nitrite: NEGATIVE
Protein, ur: >=300 mg/dL — AB
Specific Gravity, Urine: 1.027 (ref 1.005–1.030)
WBC, UA: >50 WBC/hpf — ABNORMAL HIGH (ref 0–5)
pH: 5 (ref 5.0–8.0)

## 2020-12-01 LAB — BASIC METABOLIC PANEL
Anion gap: 16 — ABNORMAL HIGH (ref 5–15)
BUN: 17 mg/dL (ref 8–23)
CO2: 23 mmol/L (ref 22–32)
Calcium: 9.5 mg/dL (ref 8.9–10.3)
Chloride: 90 mmol/L — ABNORMAL LOW (ref 98–111)
Creatinine, Ser: 0.7 mg/dL (ref 0.44–1.00)
GFR, Estimated: >60 mL/min (ref >60)
Glucose, Bld: 123 mg/dL — ABNORMAL HIGH (ref 70–99)
Potassium: 3.6 mmol/L (ref 3.5–5.1)
Sodium: 129 mmol/L — ABNORMAL LOW (ref 135–145)

## 2020-12-01 LAB — RESP PANEL BY RT-PCR (FLU A&B, COVID) ARPGX2
Influenza A by PCR: NEGATIVE
Influenza B by PCR: NEGATIVE
SARS Coronavirus 2 by RT PCR: NEGATIVE

## 2020-12-01 LAB — LIPASE, BLOOD: Lipase: 16 U/L (ref 11–51)

## 2020-12-01 LAB — TROPONIN I (HIGH SENSITIVITY)
Troponin I (High Sensitivity): 10 ng/L (ref <18)
Troponin I (High Sensitivity): 18 ng/L — ABNORMAL HIGH (ref <18)

## 2020-12-01 LAB — LACTIC ACID, PLASMA: Lactic Acid, Venous: 1.3 mmol/L (ref 0.5–1.9)

## 2020-12-01 MED ORDER — ACETAMINOPHEN 325 MG PO TABS
650.0000 mg | ORAL_TABLET | Freq: Four times a day (QID) | ORAL | Status: DC | PRN
  Administered 2020-12-01: 21:00:00 650 mg via ORAL
  Filled 2020-12-01: qty 2

## 2020-12-01 MED ORDER — ALLOPURINOL 100 MG PO TABS: 100.0000 mg | ORAL_TABLET | Freq: Every day | ORAL | Status: DC

## 2020-12-01 MED ORDER — SODIUM CHLORIDE 0.9 % IV SOLN
1.0000 g | INTRAVENOUS | Status: DC
  Administered 2020-12-02: 15:00:00 1 g via INTRAVENOUS
  Filled 2020-12-01: qty 10
  Filled 2020-12-01: qty 1

## 2020-12-01 MED ORDER — GLIPIZIDE ER 5 MG PO TB24: 5.0000 mg | ORAL_TABLET | Freq: Every day | ORAL | Status: DC

## 2020-12-01 MED ORDER — LACTATED RINGERS IV SOLN: INTRAVENOUS | Status: DC

## 2020-12-01 MED ORDER — ONDANSETRON HCL 4 MG/2ML IJ SOLN: 4.0000 mg | Freq: Four times a day (QID) | INTRAMUSCULAR | Status: DC | PRN

## 2020-12-01 MED ORDER — LISINOPRIL-HYDROCHLOROTHIAZIDE 20-25 MG PO TABS: 1.0000 | ORAL_TABLET | Freq: Every day | ORAL | Status: DC

## 2020-12-01 MED ORDER — CALCIUM CARBONATE 600 MG PO TABS: 600.0000 mg | ORAL_TABLET | Freq: Every day | ORAL | Status: DC

## 2020-12-01 MED ORDER — AZITHROMYCIN 500 MG IV SOLR
500.0000 mg | INTRAVENOUS | Status: AC
  Administered 2020-12-01 – 2020-12-02 (×2): 500 mg via INTRAVENOUS
  Filled 2020-12-01 (×2): qty 500

## 2020-12-01 MED ORDER — MORPHINE SULFATE (PF) 2 MG/ML IV SOLN: 2.0000 mg | INTRAVENOUS | Status: DC | PRN

## 2020-12-01 MED ORDER — FOLIC ACID 1 MG PO TABS: 1.0000 mg | ORAL_TABLET | Freq: Every day | ORAL | Status: DC | PRN

## 2020-12-01 MED ORDER — CALCIUM CARBONATE 1250 (500 CA) MG PO TABS
625.0000 mg | ORAL_TABLET | Freq: Every day | ORAL | Status: DC
  Administered 2020-12-02 – 2020-12-03 (×2): 625 mg via ORAL
  Filled 2020-12-01 (×2): qty 0.5

## 2020-12-01 MED ORDER — HYDROCHLOROTHIAZIDE 25 MG PO TABS
25.0000 mg | ORAL_TABLET | Freq: Every day | ORAL | Status: DC
  Administered 2020-12-02 – 2020-12-03 (×2): 25 mg via ORAL
  Filled 2020-12-01 (×2): qty 1

## 2020-12-01 MED ORDER — POLYETHYLENE GLYCOL 3350 17 G PO PACK: 17.0000 g | PACK | Freq: Every day | ORAL | Status: DC | PRN

## 2020-12-01 MED ORDER — PANTOPRAZOLE SODIUM 40 MG PO TBEC
40.0000 mg | DELAYED_RELEASE_TABLET | Freq: Every day | ORAL | Status: DC
  Administered 2020-12-02 – 2020-12-03 (×2): 40 mg via ORAL
  Filled 2020-12-01 (×2): qty 1

## 2020-12-01 MED ORDER — LEVOTHYROXINE SODIUM 50 MCG PO TABS
150.0000 ug | ORAL_TABLET | Freq: Every day | ORAL | Status: DC
  Administered 2020-12-02 – 2020-12-03 (×2): 150 ug via ORAL
  Filled 2020-12-01 (×2): qty 1

## 2020-12-01 MED ORDER — HYDROCODONE-ACETAMINOPHEN 5-325 MG PO TABS
1.0000 | ORAL_TABLET | ORAL | Status: DC | PRN
  Administered 2020-12-01: 2 via ORAL
  Filled 2020-12-01: qty 2

## 2020-12-01 MED ORDER — ONDANSETRON HCL 4 MG PO TABS: 4.0000 mg | ORAL_TABLET | Freq: Four times a day (QID) | ORAL | Status: DC | PRN

## 2020-12-01 MED ORDER — IOHEXOL 300 MG/ML  SOLN
100.0000 mL | Freq: Once | INTRAMUSCULAR | Status: AC | PRN
  Administered 2020-12-01: 100 mL via INTRAVENOUS
  Filled 2020-12-01: qty 100

## 2020-12-01 MED ORDER — ALLOPURINOL 100 MG PO TABS
100.0000 mg | ORAL_TABLET | Freq: Every day | ORAL | Status: DC
  Administered 2020-12-02 – 2020-12-03 (×2): 100 mg via ORAL
  Filled 2020-12-01 (×2): qty 1

## 2020-12-01 MED ORDER — SODIUM CHLORIDE 0.9 % IV BOLUS
500.0000 mL | Freq: Once | INTRAVENOUS | Status: AC
  Administered 2020-12-01: 500 mL via INTRAVENOUS

## 2020-12-01 MED ORDER — HYDRALAZINE HCL 20 MG/ML IJ SOLN: 5.0000 mg | INTRAMUSCULAR | Status: DC | PRN

## 2020-12-01 MED ORDER — LEVETIRACETAM 500 MG PO TABS
500.0000 mg | ORAL_TABLET | Freq: Two times a day (BID) | ORAL | Status: DC
  Administered 2020-12-01 – 2020-12-03 (×4): 500 mg via ORAL
  Filled 2020-12-01 (×5): qty 1

## 2020-12-01 MED ORDER — LISINOPRIL 20 MG PO TABS
20.0000 mg | ORAL_TABLET | Freq: Every day | ORAL | Status: DC
  Administered 2020-12-02 – 2020-12-03 (×2): 20 mg via ORAL
  Filled 2020-12-01 (×2): qty 1

## 2020-12-01 MED ORDER — LORATADINE 10 MG PO TABS: 10.0000 mg | ORAL_TABLET | Freq: Every day | ORAL | Status: DC | PRN

## 2020-12-01 MED ORDER — ACETAMINOPHEN 650 MG RE SUPP: 650.0000 mg | Freq: Four times a day (QID) | RECTAL | Status: DC | PRN

## 2020-12-01 MED ORDER — SODIUM CHLORIDE 0.9 % IV SOLN
2.0000 g | INTRAVENOUS | Status: DC
  Administered 2020-12-01: 2 g via INTRAVENOUS
  Filled 2020-12-01: qty 20

## 2020-12-01 MED ORDER — GLIPIZIDE ER 5 MG PO TB24
5.0000 mg | ORAL_TABLET | Freq: Every day | ORAL | Status: DC
  Administered 2020-12-02: 5 mg via ORAL
  Filled 2020-12-01 (×2): qty 1

## 2020-12-01 MED ORDER — ENOXAPARIN SODIUM 40 MG/0.4ML IJ SOSY
40.0000 mg | PREFILLED_SYRINGE | INTRAMUSCULAR | Status: DC
  Administered 2020-12-01 – 2020-12-02 (×2): 40 mg via SUBCUTANEOUS
  Filled 2020-12-01 (×2): qty 0.4

## 2020-12-01 NOTE — Procedures (Signed)
History: 80 yo F being evaluated for encephalopathy  Sedation: NOne  Technique: This is a 21 channel routine scalp EEG performed at the bedside with bipolar and monopolar montages arranged in accordance to the international 10/20 system of electrode placement. One channel was dedicated to EKG recording.    Background: There is a posterior dominant rhythm(PDR) of 9 Hz which is asymmetric, seen better on the left than right.  In addition, there is focal irregular delta range slow activity in the right posterior quadrant. Sleep is not recorded.   Photic stimulation: Physiologic driving is present  EEG Abnormalities: 1) Asymmetric PDR 2) Focal right posterior quadrant slow activity.   Clinical Interpretation: This EEG is consistent with an area of focal cerebral dysfunction in the right posterior quadrant as can be seen with chronic stroke, though post-ictal state could also be a consideration.  There was no seizure or definite evidence of seizure predisposition recorded on this study. Please note that lack of epileptiform activity on EEG does not preclude the possibility of epilepsy.   Roland Rack, MD [facility] [phone]  If 7pm- 7am, please page neurology on call as listed in [place].

## 2020-12-01 NOTE — H&P (Signed)
History and Physical    Vanessa Esparza [id number] DOB: 04-Jul-1940 DOA: 12/01/2020  PCP: [name], MD  Chief Complaint: Altered mental status, nausea, vomiting  HPI: Vanessa Esparza is a 80 y.o. 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  The patient presents to the emergency department due to nausea and vomiting for the past 2 days.  Nonbloody nonbilious emesis.  Husband reports increased confusion today.  She also had a near syncopal episode this morning.  No fevers.  Endorses chills.  Denies any urinary complaints including dysuria or frequency.  She did tell the ED provider that she did have urinary frequency though but not to me.  No abdominal pain.  Vital signs are stable.  She is not septic.  She is not acutely hypoxic.  Denies any cough.    ED Course: Plain film imaging obtained with possible pneumonia.  CT head was negative.  Given ceftriaxone 2 g IV x1, Zithromax 500 mg IV x1, normal saline 500 cc bolus and started on lactated Ringer's at 150 cc an hour.  Review of Systems: 14 point review of systems is negative except for what is mentioned above in the HPI.   Past Medical History:  Diagnosis Date   Anemia    Arthritis    Collagen vascular disease ([place])    Rhematoid Arthritis   Diabetes mellitus without complication (HCC)    GERD (gastroesophageal reflux disease)    Gout    Hyperlipidemia    Hypertension    Hypothyroidism    Meningioma ([place])    Psoriasis    Renal cell carcinoma, left ([place]) 05/2011   Left Partial Nephrectomy   Seizure ([place])    Seizures ([place])    LAST SEIZURE ON 02-08-09    Past Surgical History:  Procedure Laterality Date   ABDOMINAL HYSTERECTOMY     BACK SURGERY     LUMBAR   BRAIN MENINGIOMA EXCISION     BRAIN SURGERY      CRANIOTOMY WITH EXCISION OF SUPRATENTORIAL MENINGIOMA   COLONOSCOPY     COLONOSCOPY WITH PROPOFOL N/A 01/28/2017   Procedure: COLONOSCOPY WITH PROPOFOL;  Surgeon: Lollie Sails, MD;  Location: [facility] ENDOSCOPY;  Service: Endoscopy;  Laterality: N/A;   ESOPHAGOGASTRODUODENOSCOPY     ESOPHAGOGASTRODUODENOSCOPY (EGD) WITH PROPOFOL N/A 01/28/2017   Procedure: ESOPHAGOGASTRODUODENOSCOPY (EGD) WITH PROPOFOL;  Surgeon: Lollie Sails, MD;  Location: [facility] ENDOSCOPY;  Service: Endoscopy;  Laterality: N/A;   HEMORRHOID SURGERY     KNEE ARTHROSCOPY Right 09/21/2015   Procedure: ARTHROSCOPY KNEE, LATERAL AND MEDIAL MENISECTOMY;  Surgeon: Hessie Knows, MD;  Location: [facility] ORS;  Service: Orthopedics;  Laterality: Right;   LAPAROSCOPIC BILATERAL SALPINGO OOPHERECTOMY Bilateral 04/12/2019   Procedure: LAPAROSCOPIC BILATERAL SALPINGO OOPHORECTOMY;  Surgeon: Ward, Honor Loh, MD;  Location: [facility] ORS;  Service: Gynecology;  Laterality: Bilateral;   PARTIAL NEPHRECTOMY Left    DUE TO RENAL CELL CARCINOMA   REDUCTION MAMMAPLASTY Bilateral    THYROID SURGERY     TOTAL KNEE ARTHROPLASTY Right 06/30/2018   Procedure: TOTAL KNEE ARTHROPLASTY-RIGHT;  Surgeon: Hessie Knows, MD;  Location: [facility] ORS;  Service: Orthopedics;  Laterality: Right;    Social History   Socioeconomic History   Marital status: Married    Spouse name: Not on file   Number of children: Not on file   Years of education: Not on file   Highest  education level: Not on file  Occupational History   Not on file  Tobacco Use   Smoking status: Never   Smokeless tobacco: Never  Vaping Use   Vaping Use: Never used  Substance and Sexual Activity   Alcohol use: No   Drug use: No   Sexual activity: Not on file  Other Topics Concern   Not on file  Social History Narrative   Not on file   Social Determinants of Health   Financial Resource Strain: Not on file  Food Insecurity: Not on file  Transportation Needs: Not on file  Physical  Activity: Not on file  Stress: Not on file  Social Connections: Not on file  Intimate Partner Violence: Not on file    Allergies  Allergen Reactions   Arava [Leflunomide]     Swelling tongue and ulcers in mouth    Methotrexate Derivatives Swelling    Tongue swelling   Statins Other (See Comments)    BLOATED FEELING, CHEST FULLNESS    Family History  Problem Relation Age of Onset   Breast cancer Other    Breast cancer Mother     Prior to Admission medications   Medication Sig Start Date End Date Taking? Authorizing Provider  allopurinol (ZYLOPRIM) 100 MG tablet Take 100 mg by mouth daily.     [provider]  calcium carbonate (OS-CAL) 600 MG TABS tablet Take 600 mg by mouth daily.    [provider]  clobetasol (TEMOVATE) 0.05 % external solution Apply topically 2 (two) times daily. 08/19/19   [provider]  conjugated estrogens (PREMARIN) vaginal cream Apply one pea-sized amount around the opening of the urethra three times weekly. 01/25/20   Vaillancourt, Aldona Bar, PA-C  fexofenadine (ALLEGRA) 180 MG tablet Take 180 mg by mouth daily.     [provider]  Fluocinolone Acetonide Scalp 0.01 % OIL Apply topically. 12/03/19   [provider]  fluocinonide (LIDEX) 0.05 % external solution Apply topically 2 (two) times daily as needed. 12/03/19   [provider]  folic acid (FOLVITE) [id number] MCG tablet Take 800 mcg by mouth daily as needed (upset stomach).    [provider]  glipiZIDE (GLUCOTROL XL) 5 MG 24 hr tablet Take 5 mg by mouth daily. 09/07/20   [provider]  glucose blood test strip USE 1 STRIP TO CHECK GLUCOSE TWICE DAILY 03/20/18   [provider]  levETIRAcetam (KEPPRA) 500 MG tablet Take 500 mg by mouth 2 (two) times daily.    [provider]  levothyroxine (SYNTHROID, LEVOTHROID) 150 MCG tablet Take 150 mcg by mouth daily before breakfast.    [provider]   lisinopril-hydrochlorothiazide (PRINZIDE,ZESTORETIC) 20-25 MG tablet Take 1 tablet by mouth daily.    [provider]  mirabegron ER (MYRBETRIQ) 25 MG TB24 tablet Take 1 tablet (25 mg total) by mouth daily. 03/24/20   Stoioff, Ronda Fairly, MD  omeprazole (PRILOSEC) 40 MG capsule Take 40 mg by mouth daily.    [provider]  pantoprazole (PROTONIX) 40 MG tablet Take 40 mg by mouth 2 (two) times daily. 08/10/20   [provider]    Physical Exam: Vitals:   12/01/20 0858 12/01/20 1615  BP: (!) 144/85 (!) 158/79  Pulse: 96 86  Resp: 20 17  Temp: 98.8 F (37.1 C)   TempSrc: Oral   SpO2: 97% 95%  Weight: 79.4 kg   Height: '5\' 10"'$  (1.778 m)      General:  Appears calm and comfortable  and is in NAD, trying to get out of the bed to use the bathroom Cardiovascular:  RRR, no m/r/g.  Respiratory:   CTA bilaterally with no wheezes/rales/rhonchi.  Normal respiratory effort. Abdomen:  soft, NT, ND, NABS Skin:  no rash or induration seen on limited exam Musculoskeletal:  grossly normal tone BUE/BLE, good ROM, no bony abnormality Lower extremity:  No LE edema.  Limited foot exam with no ulcerations.  2+ distal pulses. Psychiatric:  grossly normal mood and affect, speech fluent and appropriate, AOx3 Neurologic:  CN 2-12 grossly intact, moves all extremities in coordinated fashion, sensation intact    Radiological Exams on Admission: Independently reviewed - see discussion in A/P where applicable  CT Head Wo Contrast  Result Date: 12/01/2020 CLINICAL DATA:  Mental status change EXAM: CT HEAD WITHOUT CONTRAST TECHNIQUE: Contiguous axial images were obtained from the base of the skull through the vertex without intravenous contrast. COMPARISON:  02/08/2009 FINDINGS: Brain: No evidence of acute infarction, hemorrhage, hydrocephalus, extra-axial collection or mass lesion/mass effect. Mild cerebral anatomy. Right temporo-parietal lobe consistent with encephalomalacia. Vascular:  No hyperdense vessel or unexpected calcification. Skull: No acute osseous abnormality.  Right parietal craniotomy. Sinuses/Orbits: Visualized paranasal sinuses are clear. Visualized mastoid sinuses are clear. Visualized orbits demonstrate no focal abnormality. Other: None IMPRESSION: 1. No acute intracranial pathology. 2. Cerebral atrophy. 3. Right temporo-parietal lobe consistent with encephalomalacia. Electronically Signed   By: Kathreen Devoid   On: 12/01/2020 13:02   CT ABDOMEN PELVIS W CONTRAST  Result Date: 12/01/2020 CLINICAL DATA:  Nausea vomiting. Dizziness. Prior left renal cell carcinoma with prior partial nephrectomy. EXAM: CT ABDOMEN AND PELVIS WITH CONTRAST TECHNIQUE: Multidetector CT imaging of the abdomen and pelvis was performed using the standard protocol following bolus administration of intravenous contrast. CONTRAST:  137m OMNIPAQUE IOHEXOL 300 MG/ML  SOLN COMPARISON:  08/30/2020 FINDINGS: Lower chest: Mild indistinctly marginated airspace opacity in the lingula and adjacent anteromedial segment left lower lobe. Previously there were some small acinar nodules in this region. Small bulla in the left lower lobe, stable. Right breast fatty density with scattered calcifications along its margins, stable, probably from fat necrosis. Descending thoracic aortic and circumflex coronary artery atherosclerotic calcification. Hepatobiliary: Stable hypodense hepatic lesions favoring cysts. Gallbladder unremarkable. No biliary dilatation. Pancreas: Stable atrophy. Spleen: Unremarkable Adrenals/Urinary Tract: Both adrenal glands appear unremarkable. Stable scarring in the left mid kidney posteriorly somewhat irregular ring density along the posterior capsular margin of the left kidney measures 1.8 by 1.0 cm on image 37 series 2, previously 1.9 by 0.9 cm by my measurements, essentially stable. 0.9 cm hypodense lesion in the right mid upper kidney on image 34 series 2 and separate 1.2 by 1.0 cm hypodense  lesion of the right kidney upper pole, likely cysts. Exophytic complex lesion of the right kidney lower pole measuring 1.0 cm in diameter on image 47 series 2, difficult to characterize due to small size aside from the fact that this lesion appears complex and is not fluid density. Punctate calcification along the inferior margin of this lesion on image 66 series 6. Back on 05/02/2014 there is a larger complex lesion in this vicinity measuring about 1.5 by 1.5 cm. Stomach/Bowel: Mild sigmoid colon diverticulosis. Vascular/Lymphatic: Aortoiliac atherosclerotic vascular disease. Reproductive: Uterus absent.  Adnexa unremarkable. Other: No supplemental non-categorized findings. Musculoskeletal: Lumbar spondylosis and degenerative disc disease causing multilevel impingement. IMPRESSION: 1. Prior partial left nephrectomy with a 1.8 by 1.0 cm ring-like structure along the posterior renal capsule just below the nephrectomy site. This  partially corresponds to a thinner ring-like structure shown on 05/02/2014, and may well simply represent evolutionary changes from the prior partial nephrectomy. This is not changed from 08/30/2020. Surveillance of follow up renal protocol CT or (preferably) MRI recommended within the next 6 months. 2. Complex 1.0 cm lesion exophytic from the right kidney lower pole. This is at a site of a larger complex cyst shown on 05/02/2014 and the reduction in size tends to be somewhat reassuring against malignancy, but this also merits careful surveillance. No change from 08/30/2020. 3. Increased mild airspace opacity in the lingula and adjacent anteromedial segment left lower lobe. Possibilities may include hypersensitivity pneumonitis or pneumonia. 4.  Aortic Atherosclerosis (ICD10-I70.0).  Coronary atherosclerosis. 5. Sigmoid colon diverticulosis. 6. Multilevel lumbar impingement. Electronically Signed   By: Van Clines M.D.   On: 12/01/2020 14:58   DG Chest Portable 1 View  Result Date:  12/01/2020 CLINICAL DATA:  weakness, eval for infiltrate EXAM: PORTABLE CHEST 1 VIEW COMPARISON:  02/09/2017 FINDINGS: The heart size and mediastinal contours are within normal limits. Atherosclerotic calcification of the aortic knob. Mildly prominent bibasilar interstitial markings is similar in appearance to prior. No focal airspace consolidation, pleural effusion, or pneumothorax. Degenerative changes of the bilateral AC joints. IMPRESSION: No active disease. Electronically Signed   By: Davina Poke D.O.   On: 12/01/2020 12:46    EKG: Independently reviewed.  NSR with rate rate 98.  No acute ischemic changes.   Labs on Admission: I have personally reviewed the available labs and imaging studies at the time of the admission.  Pertinent labs: Sodium 129, chloride 90, hemoglobin 11.9, blood glucose 123, troponin 10, repeat troponin 18, AST 53, ALT 45.  Urinalysis positive for UTI     Assessment/Plan: Acute metabolic encephalopathy likely secondary to community-acquired pneumonia and uncomplicated UTI: This appears resolved upon my exam.  CT head was negative for any acute findings.  She is alert and oriented x4 upon my exam.  She was given ceftriaxone 2 g IV x1 and Zithromax 500 mg IV x1 in the emergency department.  Urine culture and blood cultures ordered and pending results.  Previously grown Klebsiella in the urine in the past that is sensitive to ceftriaxone.  We will continue with ceftriaxone 1 g IV daily.  Continue Zithromax 500 mg IV daily.  She has a history of seizure disorder.  Will order EEG to rule out subclinical seizures  Community acquired pneumonia: Plan as above.  Not hypoxic.  Uncomplicated UTI: Plan as above.  Not septic.  Near syncope likely secondary above: Plan as above  Hypovolemic hyponatremia: Initial sodium 129.  Likely secondary to dehydration.  Continue with lactated Ringer's 150 cc an hour.  Repeat BMP in the morning.  Non-insulin-dependent diabetes mellitus:  continue home glipizide 5 mg  Hypertension: Continue home lisinopril/HCTZ 20/25 mg daily  Hypothyroidism: Continue home Synthroid 150 mcg daily.  Obtain TSH  Left renal cell carcinoma status post left partial nephrectomy: No acute treatment  History of brain meningioma: No acute treatment  Seizure disorder: Continue home Keppra 500 mg twice daily.  Seizure precautions ordered.  Gout: Not in acute flare.  Continue allopurinol 100 mg daily.  Level of Care: MedSurg DVT prophylaxis: Lovenox subcu Code Status: Full code Consults: None Admission status: Observation   Leslee Home DO [facility]   How to contact the [facility] Attending or Consulting provider [place] or covering provider during after hours [place], for this patient?  Check the care team  in [facility] and look for a) attending/consulting [facility] provider listed and b) the [facility] team listed Log into [email or web address] and use [place]'s universal password to access. If you do not have the password, please contact the hospital operator. Locate the [facility] provider you are looking for under [facility] and page to a number that you can be directly reached. If you still have difficulty reaching the provider, please page the [facility] (Director on Call) for the Hospitalists listed on amion for assistance.   12/01/2020, 5:07 PM

## 2020-12-01 NOTE — ED Triage Notes (Signed)
Pt brought in via [facility] from home, with complaints of dizziness that caused her to fall. In the truck she became Nauseated.

## 2020-12-01 NOTE — Progress Notes (Signed)
Eeg done 

## 2020-12-01 NOTE — ED Notes (Signed)
Request made for transport  

## 2020-12-01 NOTE — ED Triage Notes (Signed)
She reports that she has been getting dizzy when she first gets up in the am

## 2020-12-01 NOTE — ED Provider Notes (Signed)
[facility] Emergency Department Provider Note    Event Date/Time   First MD Initiated Contact with Patient 12/01/20 1129     (approximate)  I have reviewed the triage vital signs and the nursing notes.   HISTORY  Chief Complaint Dizziness    HPI ONIE EDWARDSON is a 80 y.o. female with below listed past medical history presents to the ER for evaluation of generalized weakness.  Denies any pain.  Reportedly was try to get out of bed and just slid.  Did not hit her head.  Denies any pain.  States that she has had poor appetite but denies any abdominal pain or discomfort.  No back pain.  No vomiting did have some nausea.  States she was on the ground only about 15 minutes.  Has a history of recurrent urinary tract infections but does not feel that she is having any burning dysuria or discomfort at this time.  No measured fevers.   Past Medical History:  Diagnosis Date   Anemia    Arthritis    Collagen vascular disease ([place])    Rhematoid Arthritis   Diabetes mellitus without complication (HCC)    GERD (gastroesophageal reflux disease)    Gout    Hyperlipidemia    Hypertension    Hypothyroidism    Meningioma (HCC)    Psoriasis    Renal cell carcinoma, left ([place]) 05/2011   Left Partial Nephrectomy   Seizure ([place])    Seizures ([place])    LAST SEIZURE ON 02-08-09   Family History  Problem Relation Age of Onset   Breast cancer Other    Breast cancer Mother    Past Surgical History:  Procedure Laterality Date   ABDOMINAL HYSTERECTOMY     BACK SURGERY     LUMBAR   BRAIN MENINGIOMA EXCISION     BRAIN SURGERY     CRANIOTOMY WITH EXCISION OF SUPRATENTORIAL MENINGIOMA   COLONOSCOPY     COLONOSCOPY WITH PROPOFOL N/A 01/28/2017   Procedure: COLONOSCOPY WITH PROPOFOL;  Surgeon: Lollie Sails, MD;  Location: [facility] ENDOSCOPY;  Service: Endoscopy;  Laterality: N/A;   ESOPHAGOGASTRODUODENOSCOPY     ESOPHAGOGASTRODUODENOSCOPY (EGD) WITH PROPOFOL N/A  01/28/2017   Procedure: ESOPHAGOGASTRODUODENOSCOPY (EGD) WITH PROPOFOL;  Surgeon: Lollie Sails, MD;  Location: [facility] ENDOSCOPY;  Service: Endoscopy;  Laterality: N/A;   HEMORRHOID SURGERY     KNEE ARTHROSCOPY Right 09/21/2015   Procedure: ARTHROSCOPY KNEE, LATERAL AND MEDIAL MENISECTOMY;  Surgeon: Hessie Knows, MD;  Location: [facility] ORS;  Service: Orthopedics;  Laterality: Right;   LAPAROSCOPIC BILATERAL SALPINGO OOPHERECTOMY Bilateral 04/12/2019   Procedure: LAPAROSCOPIC BILATERAL SALPINGO OOPHORECTOMY;  Surgeon: Ward, Honor Loh, MD;  Location: [facility] ORS;  Service: Gynecology;  Laterality: Bilateral;   PARTIAL NEPHRECTOMY Left    DUE TO RENAL CELL CARCINOMA   REDUCTION MAMMAPLASTY Bilateral    THYROID SURGERY     TOTAL KNEE ARTHROPLASTY Right 06/30/2018   Procedure: TOTAL KNEE ARTHROPLASTY-RIGHT;  Surgeon: Hessie Knows, MD;  Location: [facility] ORS;  Service: Orthopedics;  Laterality: Right;   Patient Active Problem List   Diagnosis Date Noted   Overactive bladder 03/24/2020   Urge incontinence 03/24/2020   S/P TKR (total knee replacement) using cement, right 06/30/2018   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[place]) 10/01/2016   Personal history of renal cell carcinoma 09/30/2016   Anemia 08/16/2016   Diabetes mellitus type 2, uncomplicated ([place]) [id number]   Hyperlipidemia, unspecified 08/16/2016   Hypertension 08/16/2016   Thyroid disease  08/16/2016   Acquired cyst of kidney 04/17/2012   Mixed urge and stress incontinence [id number]   Renal colic 123XX123   Malignant neoplasm of kidney ([place]) 03/28/2011   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 Authorizing Provider  allopurinol (ZYLOPRIM) 100 MG tablet Take 100 mg by mouth daily.      [provider]  calcium carbonate (OS-CAL) 600 MG TABS tablet Take 600 mg by mouth daily.    [provider]  clobetasol (TEMOVATE) 0.05 % external solution Apply topically 2 (two) times daily. 08/19/19   [provider]  conjugated estrogens (PREMARIN) vaginal cream Apply one pea-sized amount around the opening of the urethra three times weekly. 01/25/20   Vaillancourt, Aldona Bar, PA-C  fexofenadine (ALLEGRA) 180 MG tablet Take 180 mg by mouth daily.     [provider]  Fluocinolone Acetonide Scalp 0.01 % OIL Apply topically. 12/03/19   [provider]  fluocinonide (LIDEX) 0.05 % external solution Apply topically 2 (two) times daily as needed. 12/03/19   [provider]  folic acid (FOLVITE) Q000111Q MCG tablet Take 800 mcg by mouth daily as needed (upset stomach).    [provider]  glipiZIDE (GLUCOTROL) 5 MG tablet Take 5 mg by mouth daily before breakfast.    [provider]  glucose blood test strip USE 1 STRIP TO CHECK GLUCOSE TWICE DAILY 03/20/18   [provider]  ibuprofen (ADVIL) 800 MG tablet Take 1 tablet (800 mg total) by mouth every 6 (six) hours. 04/12/19   Ward, Honor Loh, MD  levETIRAcetam (KEPPRA) 500 MG tablet Take 500 mg by mouth 2 (two) times daily.    [provider]  levothyroxine (SYNTHROID, LEVOTHROID) 150 MCG tablet Take 150 mcg by mouth daily before breakfast.    [provider]  lisinopril-hydrochlorothiazide (PRINZIDE,ZESTORETIC) 20-25 MG tablet Take 1 tablet by mouth daily.    [provider]  mirabegron ER (MYRBETRIQ) 25 MG TB24 tablet Take 1 tablet (25 mg total) by mouth daily. 03/24/20   Stoioff, Ronda Fairly, MD  omeprazole (PRILOSEC) 40 MG capsule Take 40 mg by mouth daily.    [provider]    Allergies Arava [leflunomide], Methotrexate derivatives, and Statins    Social History Social History   Tobacco Use   Smoking status: Never   Smokeless  tobacco: Never  Vaping Use   Vaping Use: Never used  Substance Use Topics   Alcohol use: No   Drug use: No    Review of Systems Patient denies headaches, rhinorrhea, blurry vision, numbness, shortness of breath, chest pain, edema, cough, abdominal pain, nausea, vomiting, diarrhea, dysuria, fevers, rashes or hallucinations unless otherwise stated above in HPI. ____________________________________________   PHYSICAL EXAM:  VITAL SIGNS: Vitals:   12/01/20 0858  BP: (!) 144/85  Pulse: 96  Resp: 20  Temp: 98.8 F (37.1 C)  SpO2: 97%    Constitutional: Alert but frail appearing  Eyes: Conjunctivae are normal.  Head: Atraumatic. Nose: No congestion/rhinnorhea. Mouth/Throat: Mucous membranes are moist.   Neck: No stridor. Painless ROM.  Cardiovascular: Normal rate, regular rhythm. Grossly normal heart sounds.  Good peripheral circulation. Respiratory: Normal respiratory effort.  No retractions. Lungs CTAB. Gastrointestinal: Soft and nontender. No distention. No abdominal bruits. No CVA tenderness. Genitourinary: deferred Musculoskeletal: No lower extremity tenderness  nor edema.  No joint effusions. Neurologic:  Normal speech and language. No gross focal neurologic deficits are appreciated. No facial droop Skin:  Skin is warm, dry and intact. No rash noted. Psychiatric: Mood and affect are normal. Speech and behavior are normal.  ____________________________________________   LABS (all labs ordered are listed, but only abnormal results are displayed)  Results for orders placed or performed during the hospital encounter of 12/01/20 (from the past 24 hour(s))  Urinalysis, Complete w Microscopic     Status: Abnormal   Collection Time: 12/01/20  9:01 AM  Result Value Ref Range   Color, Urine AMBER (A) YELLOW   APPearance CLOUDY (A) CLEAR   Specific Gravity, Urine 1.027 1.005 - 1.030   pH 5.0 5.0 - 8.0   Glucose, UA NEGATIVE NEGATIVE mg/dL   Hgb urine dipstick MODERATE (A)  NEGATIVE   Bilirubin Urine NEGATIVE NEGATIVE   Ketones, ur 20 (A) NEGATIVE mg/dL   Protein, ur >=300 (A) NEGATIVE mg/dL   Nitrite NEGATIVE NEGATIVE   Leukocytes,Ua SMALL (A) NEGATIVE   RBC / HPF 11-20 0 - 5 RBC/hpf   WBC, UA >50 (H) 0 - 5 WBC/hpf   Bacteria, UA RARE (A) NONE SEEN   Squamous Epithelial / LPF 21-50 0 - 5   WBC Clumps PRESENT    Mucus PRESENT    Hyaline Casts, UA PRESENT    Cellular Cast, UA PRESENT   Basic metabolic panel     Status: Abnormal   Collection Time: 12/01/20  9:02 AM  Result Value Ref Range   Sodium 129 (L) 135 - 145 mmol/L   Potassium 3.6 3.5 - 5.1 mmol/L   Chloride 90 (L) 98 - 111 mmol/L   CO2 23 22 - 32 mmol/L   Glucose, Bld 123 (H) 70 - 99 mg/dL   BUN 17 8 - 23 mg/dL   Creatinine, Ser 0.70 0.44 - 1.00 mg/dL   Calcium 9.5 8.9 - 10.3 mg/dL   GFR, Estimated >60 >60 mL/min   Anion gap 16 (H) 5 - 15  CBC     Status: Abnormal   Collection Time: 12/01/20  9:02 AM  Result Value Ref Range   WBC 6.3 4.0 - 10.5 K/uL   RBC 4.86 3.87 - 5.11 MIL/uL   Hemoglobin 11.9 (L) 12.0 - 15.0 g/dL   HCT 36.5 36.0 - 46.0 %   MCV 75.1 (L) 80.0 - 100.0 fL   MCH 24.5 (L) 26.0 - 34.0 pg   MCHC 32.6 30.0 - 36.0 g/dL   RDW 13.2 11.5 - 15.5 %   Platelets 400 150 - 400 K/uL   nRBC 0.0 0.0 - 0.2 %  Troponin I (High Sensitivity)     Status: None   Collection Time: 12/01/20  9:02 AM  Result Value Ref Range   Troponin I (High Sensitivity) 10 <18 ng/L  Troponin I (High Sensitivity)     Status: Abnormal   Collection Time: 12/01/20 11:28 AM  Result Value Ref Range   Troponin I (High Sensitivity) 18 (H) <18 ng/L  Hepatic function panel     Status: Abnormal   Collection Time: 12/01/20 11:45 AM  Result Value Ref Range   Total Protein 7.2 6.5 - 8.1 g/dL   Albumin 4.2 3.5 - 5.0 g/dL   AST 53 (H) 15 - 41 U/L   ALT 45 (H) 0 - 44 U/L   Alkaline Phosphatase 65 38 - 126 U/L   Total Bilirubin 0.9 0.3 - 1.2 mg/dL   Bilirubin, Direct  0.1 0.0 - 0.2 mg/dL   Indirect Bilirubin 0.8 0.3  - 0.9 mg/dL  Lipase, blood     Status: None   Collection Time: 12/01/20 11:45 AM  Result Value Ref Range   Lipase 16 11 - 51 U/L  Resp Panel by RT-PCR (Flu A&B, Covid) Nasopharyngeal Swab     Status: None   Collection Time: 12/01/20 12:04 PM   Specimen: Nasopharyngeal Swab; Nasopharyngeal(NP) swabs in vial transport medium  Result Value Ref Range   SARS Coronavirus 2 by RT PCR NEGATIVE NEGATIVE   Influenza A by PCR NEGATIVE NEGATIVE   Influenza B by PCR NEGATIVE NEGATIVE   ____________________________________________  EKG My review and personal interpretation at Time: 8:53   Indication: weakness  Rate: 100  Rhythm: sinus Axis: left Other: nonspecific st and t wave abn, no stemi ____________________________________________  RADIOLOGY  I personally reviewed all radiographic images ordered to evaluate for the above acute complaints and reviewed radiology reports and findings.  These findings were personally discussed with the patient.  Please see medical record for radiology report.  ____________________________________________   PROCEDURES  Procedure(s) performed:  Procedures    Critical Care performed: no ____________________________________________   INITIAL IMPRESSION / ASSESSMENT AND PLAN / ED COURSE  Pertinent labs & imaging results that were available during my care of the patient were reviewed by me and considered in my medical decision making (see chart for details).   DDX: Dehydration, sepsis, pna, uti, hypoglycemia, cva, drug effect, withdrawal, encephalitis  HOWARD MESSNER is a 80 y.o. who presents to the ED with presentation as described above.  Patient frail-appearing does appear weak.  Blood work sent for blood differential does show evidence of hyponatremia will give IV fluids.  CT imaging ordered due to concern for some confusion does not show any evidence of acute abnormality.  She is moving all extremities and have a lower suspicion for CVA.  Report of  some nausea and vomiting will order CT abdomen.  Chest x-ray without clear infiltrate.  No hypoxia.  Possible UTI she does report increased urinary frequency.  Clinical Course as of 12/01/20 1540  Fri Dec 01, 2020  1358 Spoke with patient's husband who states that she is been having 3 days of nausea vomiting not keeping anything down.  Did have a near syncopal episode this morning.  Has not been complaining of any pain no fevers.  She typically cares for him but has been acting more weak over the past 24 hrs. [PR]  1509 CT imaging showing evidence of possible airspace disease in the lingula.  She not complaining of shortness of breath but given the nausea and vomiting possibility for pneumonitis or pneumonia.  She is becoming confused saying that she thinks that she has at home. [PR]    Clinical Course User Index [PR] Merlyn Lot, MD    The patient was evaluated in Emergency Department today for the symptoms described in the history of present illness. He/she was evaluated in the context of the global COVID-19 pandemic, which necessitated consideration that the patient might be at risk for infection with the SARS-CoV-2 virus that causes COVID-19. Institutional protocols and algorithms that pertain to the evaluation of patients at risk for COVID-19 are in a state of rapid change based on information released by regulatory bodies including the CDC and federal and state organizations. These policies and algorithms were followed during the patient's care in the ED.  As part of my medical decision making, I reviewed the following data  within the [place] notes reviewed and incorporated, Labs reviewed, notes from prior ED visits and [place] Controlled Substance Database   ____________________________________________   FINAL CLINICAL IMPRESSION(S) / ED DIAGNOSES  Final diagnoses:  Near syncope  Weakness  Confusion  Community acquired pneumonia of left lung, unspecified  part of lung      NEW MEDICATIONS STARTED DURING THIS VISIT:  New Prescriptions   No medications on file     Note:  This document was prepared using Dragon voice recognition software and may include unintentional dictation errors.    Merlyn Lot, MD 12/01/20 1540

## 2020-12-02 ENCOUNTER — Encounter: Payer: Self-pay | Admitting: Family Medicine

## 2020-12-02 DIAGNOSIS — I10 Essential (primary) hypertension: Secondary | ICD-10-CM | POA: Diagnosis present

## 2020-12-02 DIAGNOSIS — Z85528 Personal history of other malignant neoplasm of kidney: Secondary | ICD-10-CM | POA: Diagnosis not present

## 2020-12-02 DIAGNOSIS — M109 Gout, unspecified: Secondary | ICD-10-CM | POA: Diagnosis present

## 2020-12-02 DIAGNOSIS — E871 Hypo-osmolality and hyponatremia: Secondary | ICD-10-CM | POA: Diagnosis present

## 2020-12-02 DIAGNOSIS — R410 Disorientation, unspecified: Secondary | ICD-10-CM | POA: Diagnosis present

## 2020-12-02 DIAGNOSIS — Z888 Allergy status to other drugs, medicaments and biological substances status: Secondary | ICD-10-CM | POA: Diagnosis not present

## 2020-12-02 DIAGNOSIS — M069 Rheumatoid arthritis, unspecified: Secondary | ICD-10-CM | POA: Diagnosis present

## 2020-12-02 DIAGNOSIS — J189 Pneumonia, unspecified organism: Secondary | ICD-10-CM | POA: Diagnosis present

## 2020-12-02 DIAGNOSIS — E860 Dehydration: Secondary | ICD-10-CM | POA: Diagnosis present

## 2020-12-02 DIAGNOSIS — K219 Gastro-esophageal reflux disease without esophagitis: Secondary | ICD-10-CM | POA: Diagnosis present

## 2020-12-02 DIAGNOSIS — Z96651 Presence of right artificial knee joint: Secondary | ICD-10-CM | POA: Diagnosis present

## 2020-12-02 DIAGNOSIS — Z87440 Personal history of urinary (tract) infections: Secondary | ICD-10-CM | POA: Diagnosis not present

## 2020-12-02 DIAGNOSIS — E861 Hypovolemia: Secondary | ICD-10-CM | POA: Diagnosis present

## 2020-12-02 DIAGNOSIS — Z79818 Long term (current) use of other agents affecting estrogen receptors and estrogen levels: Secondary | ICD-10-CM | POA: Diagnosis not present

## 2020-12-02 DIAGNOSIS — G40909 Epilepsy, unspecified, not intractable, without status epilepticus: Secondary | ICD-10-CM | POA: Diagnosis present

## 2020-12-02 DIAGNOSIS — Z79899 Other long term (current) drug therapy: Secondary | ICD-10-CM | POA: Diagnosis not present

## 2020-12-02 DIAGNOSIS — G9341 Metabolic encephalopathy: Secondary | ICD-10-CM | POA: Diagnosis present

## 2020-12-02 DIAGNOSIS — N390 Urinary tract infection, site not specified: Secondary | ICD-10-CM | POA: Diagnosis present

## 2020-12-02 DIAGNOSIS — R55 Syncope and collapse: Secondary | ICD-10-CM | POA: Diagnosis present

## 2020-12-02 DIAGNOSIS — Z20822 Contact with and (suspected) exposure to covid-19: Secondary | ICD-10-CM | POA: Diagnosis present

## 2020-12-02 DIAGNOSIS — E039 Hypothyroidism, unspecified: Secondary | ICD-10-CM | POA: Diagnosis present

## 2020-12-02 DIAGNOSIS — E119 Type 2 diabetes mellitus without complications: Secondary | ICD-10-CM | POA: Diagnosis present

## 2020-12-02 DIAGNOSIS — Z79890 Hormone replacement therapy (postmenopausal): Secondary | ICD-10-CM | POA: Diagnosis not present

## 2020-12-02 DIAGNOSIS — Z905 Acquired absence of kidney: Secondary | ICD-10-CM | POA: Diagnosis not present

## 2020-12-02 DIAGNOSIS — Z7984 Long term (current) use of oral hypoglycemic drugs: Secondary | ICD-10-CM | POA: Diagnosis not present

## 2020-12-02 LAB — CBC
HCT: 29.4 % — ABNORMAL LOW (ref 36.0–46.0)
Hemoglobin: 9.8 g/dL — ABNORMAL LOW (ref 12.0–15.0)
MCH: 24.7 pg — ABNORMAL LOW (ref 26.0–34.0)
MCHC: 33.3 g/dL (ref 30.0–36.0)
MCV: 74.1 fL — ABNORMAL LOW (ref 80.0–100.0)
Platelets: 355 10*3/uL (ref 150–400)
RBC: 3.97 MIL/uL (ref 3.87–5.11)
RDW: 13 % (ref 11.5–15.5)
WBC: 5.4 10*3/uL (ref 4.0–10.5)
nRBC: 0 % (ref 0.0–0.2)

## 2020-12-02 LAB — BASIC METABOLIC PANEL
Anion gap: 10 (ref 5–15)
BUN: 19 mg/dL (ref 8–23)
CO2: 29 mmol/L (ref 22–32)
Calcium: 8.6 mg/dL — ABNORMAL LOW (ref 8.9–10.3)
Chloride: 94 mmol/L — ABNORMAL LOW (ref 98–111)
Creatinine, Ser: 0.66 mg/dL (ref 0.44–1.00)
GFR, Estimated: >60 mL/min (ref >60)
Glucose, Bld: 86 mg/dL (ref 70–99)
Potassium: 3.2 mmol/L — ABNORMAL LOW (ref 3.5–5.1)
Sodium: 133 mmol/L — ABNORMAL LOW (ref 135–145)

## 2020-12-02 LAB — TSH: TSH: 0.38 u[IU]/mL (ref 0.350–4.500)

## 2020-12-02 MED ORDER — LACTATED RINGERS IV SOLN: INTRAVENOUS | Status: AC

## 2020-12-02 MED ORDER — AZITHROMYCIN 500 MG PO TABS
500.0000 mg | ORAL_TABLET | Freq: Every day | ORAL | Status: DC
  Administered 2020-12-03: 08:00:00 500 mg via ORAL
  Filled 2020-12-02: qty 1

## 2020-12-02 MED ORDER — POTASSIUM CHLORIDE CRYS ER 20 MEQ PO TBCR
40.0000 meq | EXTENDED_RELEASE_TABLET | Freq: Once | ORAL | Status: AC
  Administered 2020-12-02: 40 meq via ORAL
  Filled 2020-12-02: qty 2

## 2020-12-02 NOTE — Evaluation (Signed)
Physical Therapy Evaluation Patient Details Name: Vanessa Esparza MRN: [id number] DOB: 04-27-1941 Today's Date: 12/02/2020   History of Present Illness  Pt admitted to [facility] on 12/01/20 under observation for mechanical fall, c/o generalized weakness, and multiple episodes of N/V. Mildy hyponatremic, addressed with IV fluids. Imaging reveals R temporo-parietal lobe encephalomalacia and possible PNA. Pt dx with acute metabolic encephalopathy likely secondary to community acquired PNA and uncomplicated UTI.   Clinical Impression  Pt is a 80 year old F admitted to hospital on 12/01/20 for mechanical fall and c/o nausea/vomiting. At baseline, pt is independent with ADL's, IADL's, driving, and community ambulation without AD. Pt presents with decreased bil hip strength, decreased standing balance, decreased activity tolerance, and stress incontinence, resulting in impaired functional mobility. Due to deficits, pt required mod I for bed mobility, CGA-min assist for transfers, and CGA for ambulation without AD. Pt reports increased initial stiffness from arthritis when getting up, but that it improves overtime. Pt may benefit from gait training with RW to improve independence/safety with upright mobility. Increased time/effort required during session due to stress incontinence, requiring multiple STS transfers for linen change, pericare, and toileting. Deficits limit the pt's ability to safely and independently perform ADL's, transfer, and ambulate. Pt will benefit from acute skilled PT services to address deficits for return to baseline function. At this time, PT recommends HHPT with PRN assist to address deficits and improve overall safety with functional mobility, for return to baseline function. Pt and niece agreeable.     Follow Up Recommendations Home health PT    Equipment Recommendations  None recommended by PT    Recommendations for Other Services       Precautions / Restrictions  Precautions Precautions: Fall Precaution Comments: Seizure precautions Restrictions Weight Bearing Restrictions: No      Mobility  Bed Mobility Overal bed mobility: Modified Independent             General bed mobility comments: Mod I to sit EOB with HOB slightly elevated and use of BUE for support. Increased time/effort.    Transfers Overall transfer level: Needs assistance Equipment used: Rolling walker (2 wheeled);None Transfers: Sit to/from Stand Sit to Stand: Min guard;Min assist         General transfer comment: Initially min assist for power to stand from EOB with RW. Min guard for safety to stand from EOB without AD, with significant forward flexion and reaching for cabinet for steadying.  Ambulation/Gait Ambulation/Gait assistance: Min guard Gait Distance (Feet): 15 Feet         General Gait Details: Min guard for safety to ambulate without AD, with pt reaching outside BOS for furniture for [place]. Pt demonstrates slowed cadence, early reciprocal gait, decreased step length/foot clearance bil.     Balance Overall balance assessment: Needs assistance Sitting-balance support: No upper extremity supported;Feet supported Sitting balance-Leahy Scale: Good       Standing balance-Leahy Scale: Fair Standing balance comment: Fair standing balance without AD, reaching out for wall/furniture for steadying         Pertinent Vitals/Pain Pain Assessment: No/denies pain    Home Living Family/patient expects to be discharged to:: Private residence Living Arrangements: Spouse/significant other Available Help at Discharge: Family;Available 24 hours/day;Available PRN/intermittently (Pt is caregiver for spouse, but she states he's able to provide 24/7 supervision. Niece/nephew live nearby and able to provide PRN assist) Type of Home: House Home Access: Stairs to enter   [employer] of Steps: back: 2 STE With R railing; garage entrance:  5-6 STE with  bil railing Home Layout: One level Home Equipment: Walker - 2 wheels;Cane - single point;Cane - quad;Bedside commode;Shower seat - built in;Toilet riser;Grab bars - toilet;Grab bars - tub/shower Additional Comments: Toilet is elevated height with grab bar and arm rests    Prior Function Level of Independence: Independent         Comments: Pt reports being Ind with ADL's, IADL's, driving, and community ambulation without AD. Pt is caregiver for spouse.     Hand Dominance   Dominant Hand: Right    Extremity/Trunk Assessment   Upper Extremity Assessment Upper Extremity Assessment: Overall WFL for tasks assessed (Shoulder flexion limited secondary to arthritis with 3+/5 strength, otherwise grossly 4/5. sensation intact)    Lower Extremity Assessment Lower Extremity Assessment: Overall WFL for tasks assessed (Hip flexion 3+/5, otherwise grossly 4/5. Sensation intact. Coordination intact.)    Cervical / Trunk Assessment Cervical / Trunk Assessment: Normal  Communication   Communication: No difficulties  Cognition Arousal/Alertness: Awake/alert Behavior During Therapy: WFL for tasks assessed/performed Overall Cognitive Status: Within Functional Limits for tasks assessed        General Comments: A&O x4, able to follow 100% of multistep commands         Exercises Other Exercises Other Exercises: Pt able to perform bed mobility, multiple STS transfers, and limited gait in room with grossly CGA. Increased time/effort required for stress incontinence and pericare after toileting. Other Exercises: Pt and niece educated regarding: PT role/POC, DC recommendations, use of RW for balance/energy conservation, techniques for emptying bladder, and using handicap accessible shower at DC.   Assessment/Plan    PT Assessment Patient needs continued PT services  PT Problem List Decreased strength;Decreased range of motion;Decreased activity tolerance;Decreased balance;Decreased mobility        PT Treatment Interventions DME instruction;Gait training;Stair training;Functional mobility training;Therapeutic activities;Therapeutic exercise;Balance training;Neuromuscular re-education    PT Goals (Current goals can be found in the Care Plan section)  Acute Rehab PT Goals Patient Stated Goal: "to go home" PT Goal Formulation: With patient Time For Goal Achievement: 12/16/20 Potential to Achieve Goals: Good    Frequency Min 2X/week   Barriers to discharge Decreased caregiver support PRN assist from niece/nephew       AM-PAC PT "6 Clicks" Mobility  Outcome Measure Help needed turning from your back to your side while in a flat bed without using bedrails?: None Help needed moving from lying on your back to sitting on the side of a flat bed without using bedrails?: A Little Help needed moving to and from a bed to a chair (including a wheelchair)?: A Little Help needed standing up from a chair using your arms (e.g., wheelchair or bedside chair)?: A Little Help needed to walk in hospital room?: A Little Help needed climbing 3-5 steps with a railing? : A Little 6 Click Score: 19    End of Session Equipment Utilized During Treatment: Gait belt Activity Tolerance: Patient tolerated treatment well Patient left: in chair;with call bell/phone within reach;with chair alarm set;with nursing/sitter in room;with family/visitor present Nurse Communication: Mobility status PT Visit Diagnosis: Unsteadiness on feet (R26.81);Muscle weakness (generalized) (M62.81);History of falling (Z91.81)    Time: [id number] PT Time Calculation (min) (ACUTE ONLY): 51 min   Charges:   PT Evaluation $PT Eval Low Complexity: 1 Low PT Treatments $Therapeutic Activity: 23-37 mins       Herminio Commons, PT, DPT 12:50 PM,12/02/20

## 2020-12-02 NOTE — Evaluation (Signed)
Occupational Therapy Evaluation Patient Details Name: Vanessa Esparza MRN: [id number] DOB: 29-Jan-1941 Today's Date: 12/02/2020    History of Present Illness Pt admitted to [facility] ([place] on 12/01/20 under observation for mechanical fall, c/o generalized weakness, and multiple episodes of N/V. Mildy hyponatremic, addressed with IV fluids. Imaging reveals R temporo-parietal lobe encephalomalacia and possible PNA. Pt dx with acute metabolic encephalopathy likely secondary to community acquired PNA and uncomplicated UTI.   Clinical Impression   Vanessa Esparza was seen for OT evaluation this date. Prior to hospital admission, pt was active and independent with ADL management. 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 Pt lives with her spouse in a 1 level home with 5-6 STE at the garage, and a fully adapted bathroom with walk in shower and multiple grab bars.  Currently pt demonstrates impairments as described below (See OT problem list) which functionally limit her ability to perform ADL/self-care tasks. Pt currently requires supervision for functional mobility and LB ADL management.  Pt would benefit from skilled OT services to address noted impairments and functional limitations (see below for any additional details) in order to maximize safety and independence while minimizing falls risk and caregiver burden. Do not anticipate the neef for follow up OT services upon hospital DC.      Follow Up Recommendations  No OT follow up    Equipment Recommendations  None recommended by OT    Recommendations for Other Services       Precautions / Restrictions Precautions Precautions: Fall Precaution Comments: Seizure precautions Restrictions Weight Bearing Restrictions: No      Mobility Bed Mobility Overal bed mobility: Modified Independent             General bed mobility comments: Deferred. pt up in recliner at start/end of session.    Transfers Overall transfer  level: Needs assistance Equipment used: Rolling walker (2 wheeled);None Transfers: Sit to/from Stand Sit to Stand: Supervision         General transfer comment: Supervision for STS from multiple surfaces including BSC & recliner during session.    Balance Overall balance assessment: Needs assistance Sitting-balance support: No upper extremity supported;Feet supported Sitting balance-Leahy Scale: Good     Standing balance support: During functional activity;Single extremity supported;Bilateral upper extremity supported Standing balance-Leahy Scale: Fair Standing balance comment: Generally requires at least 1 UE for support.                           ADL either performed or assessed with clinical judgement   ADL Overall ADL's : Needs assistance/impaired                                       General ADL Comments: Pt requires set-up/supervision assist for functional mobility, toilet transfer, toileting, and LB dressing during session. She demonstrates decreased processing and sequencing with novel tasks including using SA to don bilat hospital socks during session. Cuing for sequencing/safety provided t/o session.     Vision Baseline Vision/History: Wears glasses Wears Glasses: At all times Patient Visual Report: No change from baseline       Perception     Praxis      Pertinent Vitals/Pain Pain Assessment: No/denies pain     Hand Dominance Right   Extremity/Trunk Assessment Upper Extremity Assessment Upper Extremity Assessment: Overall WFL for tasks assessed (Pt with hx of bilat  shoulder arthritis. AROM limited to ~100 with shoulder flexion, grossly 3+ to 4/5 t/o. No focal weakness appreciated.)   Lower Extremity Assessment Lower Extremity Assessment: Generalized weakness;Defer to PT evaluation   Cervical / Trunk Assessment Cervical / Trunk Assessment: Normal   Communication Communication Communication: No difficulties   Cognition  Arousal/Alertness: Awake/alert Behavior During Therapy: WFL for tasks assessed/performed Overall Cognitive Status: Impaired/Different from baseline Area of Impairment: Awareness;Problem solving                             Problem Solving: Difficulty sequencing;Requires verbal cues;Slow processing General Comments: Per niece at bedside, pt not quite back to her baseline for cognition. With complex motor tasks such as using AE to don LB clothing, pt noted to have significant difficulty sequencing novel tasks and is slow to process VCs.   General Comments       Exercises Other Exercises Other Exercises: Pt able to perform bed mobility, multiple STS transfers, and limited gait in room with grossly CGA. Increased time/effort required for stress incontinence and pericare after toileting. Other Exercises: Pt and niece educated regarding: PT role/POC, DC recommendations, use of RW for balance/energy conservation, techniques for emptying bladder, and using handicap accessible shower at DC. Other Exercises: OT facilitates functional mobility in room, toileting, & LB dressing task with pt/caregiver education on safe use of AE/DME for ADL management, falls prevention strategies for home and hospital, & routines modifications to support safety and functional independence upon hospital DC.   Shoulder Instructions      Home Living Family/patient expects to be discharged to:: Private residence Living Arrangements: Spouse/significant other Available Help at Discharge: Family;Available 24 hours/day;Available PRN/intermittently (Pt is caregiver for spouse, but she states he's able to provide 24/7 supervision. Niece/nephew live nearby and able to provide PRN assist) Type of Home: House Home Access: Stairs to enter [employer] of Steps: back: 2 STE With R railing; garage entrance: 5-6 STE with bil railing Entrance Stairs-Rails: Right Home Layout: One level     Bathroom Shower/Tub:  Tub/shower unit;Walk-in shower (typically uses T/S but has handicap accessible WIS)   Bathroom Toilet: Handicapped height     Home Equipment: Walker - 2 wheels;Cane - single point;Cane - quad;Bedside commode;Shower seat - built in;Toilet riser;Grab bars - toilet;Grab bars - tub/shower;Adaptive equipment;Hand held shower head Adaptive Equipment: Reacher Additional Comments: Toilet is elevated height with grab bar and arm rests      Prior Functioning/Environment Level of Independence: Independent        Comments: Pt reports being Ind with ADL's, IADL's, driving, and community ambulation without AD. Pt is caregiver for spouse.        OT Problem List: Decreased strength;Decreased coordination;Decreased activity tolerance;Decreased safety awareness;Impaired balance (sitting and/or standing);Decreased knowledge of use of DME or AE;Decreased range of motion;Decreased cognition;Impaired UE functional use      OT Treatment/Interventions: Self-care/ADL training;Therapeutic exercise;Therapeutic activities;DME and/or AE instruction;Visual/perceptual remediation/compensation;Energy conservation;Cognitive remediation/compensation;Patient/family education    OT Goals(Current goals can be found in the care plan section) Acute Rehab OT Goals Patient Stated Goal: "to go home" OT Goal Formulation: With patient Time For Goal Achievement: 12/16/20 Potential to Achieve Goals: Good ADL Goals Pt Will Perform Grooming: with modified independence;standing;with adaptive equipment Pt Will Perform Lower Body Dressing: sit to/from stand;with supervision;with set-up;with adaptive equipment Pt Will Transfer to Toilet: bedside commode;with set-up;with supervision;ambulating Pt Will Perform Toileting - Clothing Manipulation and hygiene: with adaptive equipment;with supervision;with set-up  OT Frequency: Min  1X/week   Barriers to D/C:            Co-evaluation              AM-PAC OT "6 Clicks" Daily  Activity     Outcome Measure Help from another person eating meals?: None Help from another person taking care of personal grooming?: None Help from another person toileting, which includes using toliet, bedpan, or urinal?: A Little Help from another person bathing (including washing, rinsing, drying)?: A Little Help from another person to put on and taking off regular upper body clothing?: None Help from another person to put on and taking off regular lower body clothing?: A Little 6 Click Score: 21   End of Session Equipment Utilized During Treatment: Gait belt;Rolling walker Nurse Communication: Mobility status  Activity Tolerance: Patient tolerated treatment well Patient left: in chair;with call bell/phone within reach;with chair alarm set;with family/visitor present  OT Visit Diagnosis: Other abnormalities of gait and mobility (R26.89);Muscle weakness (generalized) (M62.81)                Time: [id number] OT Time Calculation (min): 44 min Charges:  OT General Charges $OT Visit: 1 Visit OT Evaluation $OT Eval Moderate Complexity: 1 Mod OT Treatments $Self Care/Home Management : 23-37 mins  Shara Blazing, M.S., OTR/L Ascom: [phone] 12/02/20, 2:59 PM

## 2020-12-02 NOTE — Progress Notes (Signed)
PHARMACIST - PHYSICIAN COMMUNICATION  CONCERNING: Antibiotic IV to Oral Route Change Policy  RECOMMENDATION: This patient is receiving azithromycin by the intravenous route.  Based on criteria approved by the Pharmacy and Therapeutics Committee, the antibiotic(s) is/are being converted to the equivalent oral dose form(s).   DESCRIPTION: These criteria include: Patient being treated for a respiratory tract infection, urinary tract infection, cellulitis or clostridium difficile associated diarrhea if on metronidazole The patient is not neutropenic and does not exhibit a GI malabsorption state The patient is eating (either orally or via tube) and/or has been taking other orally administered medications for a least 24 hours The patient is improving clinically and has a Tmax < 100.5  If you have questions about this conversion, please contact the [place]  12/02/20

## 2020-12-02 NOTE — Progress Notes (Signed)
PROGRESS NOTE    Vanessa Esparza  [id number] DOB: [birth year] DOA: 12/01/2020 PCP: [name], MD  Brief Narrative:Vanessa Esparza is a 80 y.o. 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  The patient presented to the emergency department due to nausea and vomiting for the past 2 days. Husband reported increased confusion x 1day.  She also had a near syncopal episode 7/22 morning   Assessment & Plan:  Acute metabolic encephalopathy likely secondary to community-acquired pneumonia , +/- UTI -Mental status improving -CT head is unremarkable, hyponatremia is improving -CT abdomen notes left lower lobe and lingular infiltrate, urinalysis was also abnormal -Continue IV ceftriaxone and azithromycin -Follow-up blood and urine cultures -EEG negative for seizures -PT OT eval   Community acquired pneumonia:  -Antibiotics as above   Uncomplicated UTI:  -Follow-up urine cultures   Near syncope likely secondary above: Plan as above   Hypovolemic hyponatremia: Initial sodium 129.  Likely secondary to dehydration.  -Improving, cut down IV fluids   Non-insulin-dependent diabetes mellitus:  -CBGs are stable, hold glipizide   Hypertension: Continue home lisinopril/HCTZ 20/25 mg daily   Hypothyroidism: Continue home Synthroid 150 mcg daily.  Obtain TSH   Left renal cell carcinoma status post left partial nephrectomy: No acute treatment   History of brain meningioma: No acute treatment   Seizure disorder: Continue home Keppra 500 mg twice daily.    Gout: Not in acute flare.  Continue allopurinol 100 mg daily.      DVT prophylaxis: Lovenox Code Status: Full code Family Communication: Discussed with niece at bedside Disposition Plan:  Status is: Observation  The patient  will require care spanning > 2 midnights and should be moved to inpatient because: Inpatient level of care appropriate due to severity of illness  Dispo: The patient is from: Home              Anticipated d/c is to: Home              Patient currently is not medically stable to d/c.   Difficult to place patient No        Consultants:    Procedures:   Antimicrobials:    Subjective: -Feels better, a little tired but overall improving, intermittent cough, no further nausea or vomiting  Objective: Vitals:   12/01/20 2019 12/01/20 2132 12/02/20 0556 12/02/20 0850  BP: (!) 155/70 115/61 (!) 104/41 (!) 124/48  Pulse: 85 76 (!) 57 68  Resp: '17 18 16 18  '$ Temp:  98.2 F (36.8 C) 98.6 F (37 C) 98.4 F (36.9 C)  TempSrc:  Oral  Oral  SpO2: 96% 100% 97% 98%  Weight:  76 kg    Height:  '5\' 10"'$  (1.778 m)      Intake/Output Summary (Last 24 hours) at 12/02/2020 1112 Last data filed at 12/01/2020 1803 Gross per 24 hour  Intake 250 ml  Output --  Net 250 ml   Filed Weights   12/01/20 0858 12/01/20 2132  Weight: 79.4 kg 76 kg    Examination:  General exam: Elderly pleasant female, laying in bed, awake alert oriented x3 HEENT: No JVD CVS: S1-S2, regular rate rhythm Lungs: Decreased breath sounds both bases Abdomen: Soft, nontender, bowel sounds present Extremities: No edema  Skin: No rashes Psychiatry:  Mood & affect appropriate.     Data Reviewed:   CBC: Recent  Labs  Lab 12/01/20 0902 12/02/20 0414  WBC 6.3 5.4  HGB 11.9* 9.8*  HCT 36.5 29.4*  MCV 75.1* 74.1*  PLT 400 [id number]   Basic Metabolic Panel: Recent Labs  Lab 12/01/20 0902 12/02/20 0414  NA 129* 133*  K 3.6 3.2*  CL 90* 94*  CO2 23 29  GLUCOSE 123* 86  BUN 17 19  CREATININE 0.70 0.66  CALCIUM 9.5 8.6*   GFR: Estimated Creatinine Clearance: 61.7 mL/min (by C-G formula based on SCr of 0.66 mg/dL). Liver Function Tests: Recent Labs  Lab 12/01/20 1145  AST 53*  ALT 45*  ALKPHOS 65   BILITOT 0.9  PROT 7.2  ALBUMIN 4.2   Recent Labs  Lab 12/01/20 1145  LIPASE 16   No results for input(s): AMMONIA in the last 168 hours. Coagulation Profile: No results for input(s): INR, PROTIME in the last 168 hours. Cardiac Enzymes: No results for input(s): CKTOTAL, CKMB, CKMBINDEX, TROPONINI in the last 168 hours. BNP (last 3 results) No results for input(s): PROBNP in the last 8760 hours. HbA1C: No results for input(s): HGBA1C in the last 72 hours. CBG: No results for input(s): GLUCAP in the last 168 hours. Lipid Profile: No results for input(s): CHOL, HDL, LDLCALC, TRIG, CHOLHDL, LDLDIRECT in the last 72 hours. Thyroid Function Tests: Recent Labs    12/02/20 0414  TSH 0.380   Anemia Panel: No results for input(s): VITAMINB12, FOLATE, FERRITIN, TIBC, IRON, RETICCTPCT in the last 72 hours. Urine analysis:    Component Value Date/Time   COLORURINE AMBER (A) 12/01/2020 0901   APPEARANCEUR CLOUDY (A) 12/01/2020 0901   APPEARANCEUR Cloudy (A) 01/25/2020 1341   LABSPEC 1.027 12/01/2020 0901   LABSPEC 1.026 04/25/2014 1237   PHURINE 5.0 12/01/2020 0901   GLUCOSEU NEGATIVE 12/01/2020 0901   GLUCOSEU Negative 04/25/2014 1237   HGBUR MODERATE (A) 12/01/2020 0901   BILIRUBINUR NEGATIVE 12/01/2020 0901   BILIRUBINUR Negative 01/25/2020 1341   BILIRUBINUR Negative 04/25/2014 1237   KETONESUR 20 (A) 12/01/2020 0901   PROTEINUR >=300 (A) 12/01/2020 0901   NITRITE NEGATIVE 12/01/2020 0901   LEUKOCYTESUR SMALL (A) 12/01/2020 0901   LEUKOCYTESUR Negative 04/25/2014 1237   Sepsis Labs: '@LABRCNTIP'$ (procalcitonin:4,lacticidven:4)  ) Recent Results (from the past 240 hour(s))  Resp Panel by RT-PCR (Flu A&B, Covid) Nasopharyngeal Swab     Status: None   Collection Time: 12/01/20 12:04 PM   Specimen: Nasopharyngeal Swab; Nasopharyngeal(NP) swabs in vial transport medium  Result Value Ref Range Status   SARS Coronavirus 2 by RT PCR NEGATIVE NEGATIVE Final    Comment:  (NOTE) SARS-CoV-2 target nucleic acids are NOT DETECTED.  The SARS-CoV-2 RNA is generally detectable in upper respiratory specimens during the acute phase of infection. The lowest concentration of SARS-CoV-2 viral copies this assay can detect is 138 copies/mL. A negative result does not preclude SARS-Cov-2 infection and should not be used as the sole basis for treatment or other patient management decisions. A negative result may occur with  improper specimen collection/handling, submission of specimen other than nasopharyngeal swab, presence of viral mutation(s) within the areas targeted by this assay, and inadequate number of viral copies(<138 copies/mL). A negative result must be combined with clinical observations, patient history, and epidemiological information. The expected result is Negative.  Fact Sheet for Patients:  EntrepreneurPulse.com.au  Fact Sheet for Healthcare Providers:  IncredibleEmployment.be  This test is no t yet approved or cleared by the [place] FDA and  has been authorized for detection and/or diagnosis of SARS-CoV-2 by FDA  under an Emergency Use Authorization (EUA). This EUA will remain  in effect (meaning this test can be used) for the duration of the COVID-19 declaration under Section 564(b)(1) of the Act, 21 U.S.C.section 360bbb-3(b)(1), unless the authorization is terminated  or revoked sooner.       Influenza A by PCR NEGATIVE NEGATIVE Final   Influenza B by PCR NEGATIVE NEGATIVE Final    Comment: (NOTE) The Xpert Xpress SARS-CoV-2/FLU/RSV plus assay is intended as an aid in the diagnosis of influenza from Nasopharyngeal swab specimens and should not be used as a sole basis for treatment. Nasal washings and aspirates are unacceptable for Xpert Xpress SARS-CoV-2/FLU/RSV testing.  Fact Sheet for Patients: EntrepreneurPulse.com.au  Fact Sheet for Healthcare  Providers: IncredibleEmployment.be  This test is not yet approved or cleared by the [place] FDA and has been authorized for detection and/or diagnosis of SARS-CoV-2 by FDA under an Emergency Use Authorization (EUA). This EUA will remain in effect (meaning this test can be used) for the duration of the COVID-19 declaration under Section 564(b)(1) of the Act, 21 U.S.C. section 360bbb-3(b)(1), unless the authorization is terminated or revoked.  Performed at [facility], [address] [place], [place] [address]   Blood culture (routine x 2)     Status: None (Preliminary result)   Collection Time: 12/01/20  3:27 PM   Specimen: BLOOD  Result Value Ref Range Status   Specimen Description BLOOD LEFT ANTECUBITAL  Final   Special Requests   Final    BOTTLES DRAWN AEROBIC AND ANAEROBIC Blood Culture adequate volume   Culture   Final    NO GROWTH < 24 HOURS Performed at [facility], [address], [place], [place] [address]    Report Status PENDING  Incomplete  Blood culture (routine x 2)     Status: None (Preliminary result)   Collection Time: 12/01/20  3:27 PM   Specimen: BLOOD  Result Value Ref Range Status   Specimen Description BLOOD RIGHT ANTECUBITAL  Final   Special Requests   Final    BOTTLES DRAWN AEROBIC AND ANAEROBIC Blood Culture adequate volume   Culture   Final    NO GROWTH < 24 HOURS Performed at [facility], [address], [place], [place] [address]    Report Status PENDING  Incomplete         Radiology Studies: CT Head Wo Contrast  Result Date: 12/01/2020 CLINICAL DATA:  Mental status change EXAM: CT HEAD WITHOUT CONTRAST TECHNIQUE: Contiguous axial images were obtained from the base of the skull through the vertex without intravenous contrast. COMPARISON:  02/08/2009 FINDINGS: Brain: No evidence of acute infarction, hemorrhage, hydrocephalus, extra-axial collection or mass lesion/mass effect. Mild  cerebral anatomy. Right temporo-parietal lobe consistent with encephalomalacia. Vascular: No hyperdense vessel or unexpected calcification. Skull: No acute osseous abnormality.  Right parietal craniotomy. Sinuses/Orbits: Visualized paranasal sinuses are clear. Visualized mastoid sinuses are clear. Visualized orbits demonstrate no focal abnormality. Other: None IMPRESSION: 1. No acute intracranial pathology. 2. Cerebral atrophy. 3. Right temporo-parietal lobe consistent with encephalomalacia. Electronically Signed   By: Kathreen Devoid   On: 12/01/2020 13:02   CT ABDOMEN PELVIS W CONTRAST  Result Date: 12/01/2020 CLINICAL DATA:  Nausea vomiting. Dizziness. Prior left renal cell carcinoma with prior partial nephrectomy. EXAM: CT ABDOMEN AND PELVIS WITH CONTRAST TECHNIQUE: Multidetector CT imaging of the abdomen and pelvis was performed using the standard protocol following bolus administration of intravenous contrast. CONTRAST:  139m OMNIPAQUE IOHEXOL 300 MG/ML  SOLN COMPARISON:  08/30/2020 FINDINGS: Lower chest: Mild indistinctly marginated airspace opacity in the lingula and adjacent anteromedial segment left lower lobe. Previously there were some small acinar nodules in this region. Small bulla in the left lower lobe, stable. Right breast fatty density with scattered calcifications along its margins, stable, probably from fat necrosis. Descending thoracic aortic and circumflex coronary artery atherosclerotic calcification. Hepatobiliary: Stable hypodense hepatic lesions favoring cysts. Gallbladder unremarkable. No biliary dilatation. Pancreas: Stable atrophy. Spleen: Unremarkable Adrenals/Urinary Tract: Both adrenal glands appear unremarkable. Stable scarring in the left mid kidney posteriorly somewhat irregular ring density along the posterior capsular margin of the left kidney measures 1.8 by 1.0 cm on image 37 series 2, previously 1.9 by 0.9 cm by my measurements, essentially stable. 0.9 cm hypodense lesion in  the right mid upper kidney on image 34 series 2 and separate 1.2 by 1.0 cm hypodense lesion of the right kidney upper pole, likely cysts. Exophytic complex lesion of the right kidney lower pole measuring 1.0 cm in diameter on image 47 series 2, difficult to characterize due to small size aside from the fact that this lesion appears complex and is not fluid density. Punctate calcification along the inferior margin of this lesion on image 66 series 6. Back on 05/02/2014 there is a larger complex lesion in this vicinity measuring about 1.5 by 1.5 cm. Stomach/Bowel: Mild sigmoid colon diverticulosis. Vascular/Lymphatic: Aortoiliac atherosclerotic vascular disease. Reproductive: Uterus absent.  Adnexa unremarkable. Other: No supplemental non-categorized findings. Musculoskeletal: Lumbar spondylosis and degenerative disc disease causing multilevel impingement. IMPRESSION: 1. Prior partial left nephrectomy with a 1.8 by 1.0 cm ring-like structure along the posterior renal capsule just below the nephrectomy site. This partially corresponds to a thinner ring-like structure shown on 05/02/2014, and may well simply represent evolutionary changes from the prior partial nephrectomy. This is not changed from 08/30/2020. Surveillance of follow up renal protocol CT or (preferably) MRI recommended within the next 6 months. 2. Complex 1.0 cm lesion exophytic from the right kidney lower pole. This is at a site of a larger complex cyst shown on 05/02/2014 and the reduction in size tends to be somewhat reassuring against malignancy, but this also merits careful surveillance. No change from 08/30/2020. 3. Increased mild airspace opacity in the lingula and adjacent anteromedial segment left lower lobe. Possibilities may include hypersensitivity pneumonitis or pneumonia. 4.  Aortic Atherosclerosis (ICD10-I70.0).  Coronary atherosclerosis. 5. Sigmoid colon diverticulosis. 6. Multilevel lumbar impingement. Electronically Signed   By: Van Clines M.D.   On: 12/01/2020 14:58   DG Chest Portable 1 View  Result Date: 12/01/2020 CLINICAL DATA:  weakness, eval for infiltrate EXAM: PORTABLE CHEST 1 VIEW COMPARISON:  02/09/2017 FINDINGS: The heart size and mediastinal contours are within normal limits. Atherosclerotic calcification of the aortic knob. Mildly prominent bibasilar interstitial markings is similar in appearance to prior. No focal airspace consolidation, pleural effusion, or pneumothorax. Degenerative changes of the bilateral AC joints. IMPRESSION: No active disease. Electronically Signed   By: Davina Poke D.O.   On: 12/01/2020 12:46   EEG adult  Result Date: 12/01/2020 Greta Doom, MD     12/01/2020  8:30 PM History: 80 yo F being evaluated for encephalopathy Sedation: NOne Technique: This is a 21 channel routine scalp EEG performed at the bedside with bipolar and monopolar montages arranged in accordance to the international 10/20 system of electrode placement. One channel was dedicated to EKG recording. Background: There is a posterior dominant rhythm(PDR) of 9 Hz which is asymmetric, seen better on the  left than right.  In addition, there is focal irregular delta range slow activity in the right posterior quadrant. Sleep is not recorded. Photic stimulation: Physiologic driving is present EEG Abnormalities: 1) Asymmetric PDR 2) Focal right posterior quadrant slow activity. Clinical Interpretation: This EEG is consistent with an area of focal cerebral dysfunction in the right posterior quadrant as can be seen with chronic stroke, though post-ictal state could also be a consideration. There was no seizure or definite evidence of seizure predisposition recorded on this study. Please note that lack of epileptiform activity on EEG does not preclude the possibility of epilepsy. Roland Rack, MD [facility] [phone] If 7pm- 7am, please page neurology on call as listed in [place].        Scheduled  Meds:  allopurinol  100 mg Oral Daily   calcium carbonate  625 mg Oral Q breakfast   enoxaparin (LOVENOX) injection  40 mg Subcutaneous Q24H   glipiZIDE  5 mg Oral Daily   lisinopril  20 mg Oral Daily   And   hydrochlorothiazide  25 mg Oral Daily   levETIRAcetam  500 mg Oral BID   levothyroxine  150 mcg Oral Q0600   pantoprazole  40 mg Oral Daily   Continuous Infusions:  azithromycin Stopped (12/01/20 1803)   cefTRIAXone (ROCEPHIN)  IV     lactated ringers 10 mL/hr at 12/02/20 0922     LOS: 0 days    Time spent: 89mn  PDomenic Polite MD [facility]   12/02/2020, 11:12 AM

## 2020-12-03 DIAGNOSIS — R55 Syncope and collapse: Secondary | ICD-10-CM

## 2020-12-03 LAB — BASIC METABOLIC PANEL
Anion gap: 10 (ref 5–15)
BUN: 16 mg/dL (ref 8–23)
CO2: 27 mmol/L (ref 22–32)
Calcium: 8.3 mg/dL — ABNORMAL LOW (ref 8.9–10.3)
Chloride: 95 mmol/L — ABNORMAL LOW (ref 98–111)
Creatinine, Ser: 0.74 mg/dL (ref 0.44–1.00)
GFR, Estimated: >60 mL/min (ref >60)
Glucose, Bld: 74 mg/dL (ref 70–99)
Potassium: 3.4 mmol/L — ABNORMAL LOW (ref 3.5–5.1)
Sodium: 132 mmol/L — ABNORMAL LOW (ref 135–145)

## 2020-12-03 LAB — URINE CULTURE

## 2020-12-03 LAB — GLUCOSE, CAPILLARY
Glucose-Capillary: 80 mg/dL (ref 70–99)
Glucose-Capillary: 94 mg/dL (ref 70–99)

## 2020-12-03 LAB — CBC
HCT: 28.9 % — ABNORMAL LOW (ref 36.0–46.0)
Hemoglobin: 9.5 g/dL — ABNORMAL LOW (ref 12.0–15.0)
MCH: 24.2 pg — ABNORMAL LOW (ref 26.0–34.0)
MCHC: 32.9 g/dL (ref 30.0–36.0)
MCV: 73.5 fL — ABNORMAL LOW (ref 80.0–100.0)
Platelets: 309 10*3/uL (ref 150–400)
RBC: 3.93 MIL/uL (ref 3.87–5.11)
RDW: 13.2 % (ref 11.5–15.5)
WBC: 3.5 10*3/uL — ABNORMAL LOW (ref 4.0–10.5)
nRBC: 0 % (ref 0.0–0.2)

## 2020-12-03 MED ORDER — ACETAMINOPHEN 325 MG PO TABS: 650.0000 mg | ORAL_TABLET | Freq: Four times a day (QID) | ORAL | Status: DC | PRN

## 2020-12-03 MED ORDER — POTASSIUM CHLORIDE CRYS ER 20 MEQ PO TBCR
40.0000 meq | EXTENDED_RELEASE_TABLET | Freq: Once | ORAL | Status: AC
  Administered 2020-12-03: 40 meq via ORAL
  Filled 2020-12-03: qty 2

## 2020-12-03 MED ORDER — CEFDINIR 300 MG PO CAPS: 300.0000 mg | ORAL_CAPSULE | Freq: Two times a day (BID) | ORAL | 0 refills | Status: AC

## 2020-12-03 NOTE — TOC Initial Note (Signed)
Transition of Care [facility]) - Initial/Assessment Note    Patient Details  Name: Vanessa Esparza MRN: [id number] Date of Birth: 16-May-1940  [facility] CM/SW Contact:    Alberteen Sam, LCSW Phone Number: 12/03/2020, 11:13 AM  Clinical Narrative:                  CSW spoke with patient regarding home health recommendations, preference for [facility] PT and RN. Uses walker at home, no DME needs.   CSW spoke with Corene Cornea with [facility], he is able to accept for PT and RN.   CSW has messaged MD, will need [place] orders for RN and PT at time of discharge.   No other discharge needs identified, patient reports son is in room and will transport home.    Expected Discharge Plan: [place] Barriers to Discharge: No Barriers Identified   Patient Goals and CMS Choice Patient states their goals for this hospitalization and ongoing recovery are:: to go home CMS Medicare.gov Compare Post Acute Care list provided to:: Patient Choice offered to / list presented to : Patient  Expected Discharge Plan and Services Expected Discharge Plan: [place] Choice: [place] arrangements for the past 2 months: Single Family Home Expected Discharge Date: 12/03/20                         HH Arranged: PT, RN [place] Agency: [place] ([place]) Date HH Agency Contacted: 12/03/20 Time [place]: 1112 Representative spoke with at [place]: Corene Cornea  Prior Living Arrangements/Services Living arrangements for the past 2 months: [place] with:: Self Patient language and need for interpreter reviewed:: Yes Do you feel safe going back to the place where you live?: Yes      Need for Family Participation in Patient Care: Yes (Comment) Care giver support system in place?: Yes (comment)   Criminal Activity/Legal Involvement Pertinent to Current Situation/Hospitalization: No - Comment as  needed  Activities of Daily Living Home Assistive Devices/Equipment: Eyeglasses, Environmental consultant (specify type) ADL Screening (condition at time of admission) Patient's cognitive ability adequate to safely complete daily activities?: No Is the patient deaf or have difficulty hearing?: No Does the patient have difficulty seeing, even when wearing glasses/contacts?: No Does the patient have difficulty concentrating, remembering, or making decisions?: Yes Patient able to express need for assistance with ADLs?: Yes Does the patient have difficulty dressing or bathing?: No Independently performs ADLs?: No Communication: Independent Dressing (OT): Independent Grooming: Independent Feeding: Independent Bathing: Independent Toileting: Independent In/Out Bed: Needs assistance Walks in Home: Independent with device (comment) Does the patient have difficulty walking or climbing stairs?: No Weakness of Legs: None Weakness of Arms/Hands: None  Permission Sought/Granted         Permission granted to share info w AGENCY: HH        Emotional Assessment   Attitude/Demeanor/Rapport: Gracious Affect (typically observed): Calm Orientation: : Oriented to Place, Oriented to Self, Oriented to Situation, Oriented to  Time Alcohol / Substance Use: Not Applicable Psych Involvement: No (comment)  Admission diagnosis:  Confusion [R41.0] Weakness [R53.1] Near syncope [id number] Acute metabolic encephalopathy [id number] Community acquired pneumonia of left lung, unspecified part of lung [J18.9] Patient Active Problem List   Diagnosis Date Noted   Near syncope    Acute metabolic encephalopathy [id number]   Overactive bladder 03/24/2020   Urge incontinence 03/24/2020  S/P TKR (total knee replacement) using cement, right 06/30/2018   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[place]) 10/01/2016   Personal history of renal cell carcinoma 09/30/2016   Anemia 08/16/2016   Diabetes mellitus  type 2, uncomplicated ([place]) [id number]   Hyperlipidemia, unspecified 08/16/2016   Hypertension 08/16/2016   Thyroid disease 08/16/2016   Acquired cyst of kidney 04/17/2012   Mixed urge and stress incontinence [id number]   Renal colic [id number]   Malignant neoplasm of kidney ([place]) 03/28/2011   Benign neoplasm of brain ([place]) 07/04/2008   Esophageal reflux 07/04/2008   Hypercholesterolemia 07/04/2008   Hypertension, benign 07/04/2008   Hypothyroidism (acquired) 07/04/2008   PCP:  [name], MD Pharmacy:   [facility] [id number] [address],  [address] [place] [address] Phone: [phone] Fax: [phone]     Social Determinants of Health (SDOH) Interventions    Readmission Risk Interventions No flowsheet data found.

## 2020-12-03 NOTE — Discharge Summary (Signed)
Physician Discharge Summary  ANOKHI ALONGE [id number] DOB: 1941/03/26 DOA: 12/01/2020  PCP: [name], MD  Admit date: 12/01/2020 Discharge date: 12/03/2020  Time spent: 10mnutes  Recommendations for Outpatient Follow-up:  PCP in 1 week Follow-up chest x-ray in 6 weeks Consider follow-up of abnormal chronic CT findings of both kidneys with MRI in 4 to 6 months Home health PT   Discharge Diagnoses:  Active Problems:   Acute metabolic encephalopathy   Near syncope Community acquired pneumonia Abnormal urinalysis History of renal cell carcinoma status post partial left nephrectomy History of meningioma History of seizure disorder Gout Hypertension  Discharge Condition: Stable  Diet recommendation: Diabetic  Filed Weights   12/01/20 0858 12/01/20 2132  Weight: 79.4 kg 76 kg    History of present illness:  EAIMIE MASINis a 80y.o. 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  The patient presented to the emergency department due to nausea and vomiting for the past 2 days. Husband reported increased confusion x 1day.  She also had a near syncopal episode 7/22 morning.  Work-up in the ED noted mild hyponatremia, CT abdomen noted left lower lobe and lingular infiltrate  Hospital Course:   Acute metabolic encephalopathy likely secondary to community-acquired pneumonia , +/- UTI -Presented to the ED with weakness, nausea vomiting, confusion, this is felt to be secondary to infectious etiology -Mental status is improved and back to baseline now -CT head is unremarkable, hyponatremia is improving -CT abdomen notes left lower lobe and lingular infiltrate, urinalysis was also abnormal -Treated with IV ceftriaxone and azithromycin, blood cultures are negative thus  far, urine cultures are pending, EEG negative for seizures  -Clinically improved and stable, PT eval completed, home health PT recommended -Transitioned to oral cefdinir at discharge for 4 more days, follow-up chest x-ray in 6 weeks   Community acquired pneumonia:  -Antibiotics as above   Possible UTI versus asymptomatic bacteriuria -No clear symptoms of UTI, urinalysis was abnormal, treated with IV ceftriaxone/azithromycin primarily for pneumonia which would cover both, urine cultures are pending at the time of discharge, discharged home on oral cefdinir for pneumonia which should cover UTI too   Near syncope likely secondary above: Plan as above   Hypovolemic hyponatremia: Initial sodium 129.   -Improved with hydration   Non-insulin-dependent diabetes mellitus:  -CBGs were stable, glipizide resumed   Hypertension: Continue home lisinopril/HCTZ 20/25 mg daily   Hypothyroidism: Continue home Synthroid 150 mcg daily.     Left renal cell carcinoma status post left partial nephrectomy:  -Chronic changes noted on imaging, recommend follow-up imaging in 6 months   history of brain meningioma:    Seizure disorder: Continue home Keppra 500 mg twice daily.    Gout:  -Continue allopurinol 100 mg daily.      Discharge Exam: Vitals:   12/03/20 0744 12/03/20 1140  BP: (!) 149/64 139/61  Pulse: 74 67  Resp: 16 16  Temp: 97.9 F (36.6 C) 98 F (36.7 C)  SpO2: 95% 97%    General: AAOx3 Cardiovascular: S1-S2, regular rate rhythm Respiratory: Clear  Discharge Instructions   Discharge Instructions     Diet - low sodium heart healthy   Complete by: As directed    Discharge wound care:   Complete by: As directed    routine   Increase activity slowly   Complete by: As directed  Allergies as of 12/03/2020       Reactions   Arava [leflunomide]    Swelling tongue and ulcers in mouth    Methotrexate Derivatives Swelling   Tongue swelling   Statins Other (See  Comments)   BLOATED FEELING, CHEST FULLNESS        Medication List     STOP taking these medications    mirabegron ER 25 MG Tb24 tablet Commonly known as: MYRBETRIQ   pantoprazole 40 MG tablet Commonly known as: PROTONIX   Premarin vaginal cream Generic drug: conjugated estrogens       TAKE these medications    acetaminophen 325 MG tablet Commonly known as: TYLENOL Take 2 tablets (650 mg total) by mouth every 6 (six) hours as needed for mild pain (or Fever >/= 101).   allopurinol 100 MG tablet Commonly known as: ZYLOPRIM Take 100 mg by mouth daily.   calcium carbonate 600 MG Tabs tablet Commonly known as: OS-CAL Take 600 mg by mouth daily.   cefdinir 300 MG capsule Commonly known as: OMNICEF Take 1 capsule (300 mg total) by mouth 2 (two) times daily for 4 days.   clobetasol 0.05 % external solution Commonly known as: TEMOVATE Apply topically 2 (two) times daily.   fexofenadine 180 MG tablet Commonly known as: ALLEGRA Take 180 mg by mouth daily.   Fluocinolone Acetonide Scalp 0.01 % Oil Apply topically.   fluocinonide 0.05 % external solution Commonly known as: LIDEX Apply topically 2 (two) times daily as needed.   folic acid [id number] MCG tablet Commonly known as: FOLVITE Take 800 mcg by mouth daily as needed (upset stomach).   glipiZIDE 5 MG 24 hr tablet Commonly known as: GLUCOTROL XL Take 5 mg by mouth daily.   glucose blood test strip USE 1 STRIP TO CHECK GLUCOSE TWICE DAILY   levETIRAcetam 500 MG tablet Commonly known as: KEPPRA Take 500 mg by mouth 2 (two) times daily.   levothyroxine 150 MCG tablet Commonly known as: SYNTHROID Take 150 mcg by mouth daily before breakfast.   lisinopril-hydrochlorothiazide 20-25 MG tablet Commonly known as: ZESTORETIC Take 1 tablet by mouth daily.   omeprazole 40 MG capsule Commonly known as: PRILOSEC Take 40 mg by mouth daily.               Discharge Care Instructions  (From admission,  onward)           Start     Ordered   12/03/20 0000  Discharge wound care:       Comments: routine   12/03/20 1043           Allergies  Allergen Reactions   Arava [Leflunomide]     Swelling tongue and ulcers in mouth    Methotrexate Derivatives Swelling    Tongue swelling   Statins Other (See Comments)    BLOATED FEELING, CHEST FULLNESS    Follow-up Information     [name], MD. Schedule an appointment as soon as possible for a visit in 1 week(s).   Specialty: Family Medicine Contact information: [address] [place] [address] [phone]                  The results of significant diagnostics from this hospitalization (including imaging, microbiology, ancillary and laboratory) are listed below for reference.    Significant Diagnostic Studies: CT Head Wo Contrast  Result Date: 12/01/2020 CLINICAL DATA:  Mental status change EXAM: CT HEAD WITHOUT CONTRAST TECHNIQUE: Contiguous axial images were  obtained from the base of the skull through the vertex without intravenous contrast. COMPARISON:  02/08/2009 FINDINGS: Brain: No evidence of acute infarction, hemorrhage, hydrocephalus, extra-axial collection or mass lesion/mass effect. Mild cerebral anatomy. Right temporo-parietal lobe consistent with encephalomalacia. Vascular: No hyperdense vessel or unexpected calcification. Skull: No acute osseous abnormality.  Right parietal craniotomy. Sinuses/Orbits: Visualized paranasal sinuses are clear. Visualized mastoid sinuses are clear. Visualized orbits demonstrate no focal abnormality. Other: None IMPRESSION: 1. No acute intracranial pathology. 2. Cerebral atrophy. 3. Right temporo-parietal lobe consistent with encephalomalacia. Electronically Signed   By: Kathreen Devoid   On: 12/01/2020 13:02   CT ABDOMEN PELVIS W CONTRAST  Result Date: 12/01/2020 CLINICAL DATA:  Nausea vomiting. Dizziness. Prior left renal cell carcinoma with prior partial  nephrectomy. EXAM: CT ABDOMEN AND PELVIS WITH CONTRAST TECHNIQUE: Multidetector CT imaging of the abdomen and pelvis was performed using the standard protocol following bolus administration of intravenous contrast. CONTRAST:  149m OMNIPAQUE IOHEXOL 300 MG/ML  SOLN COMPARISON:  08/30/2020 FINDINGS: Lower chest: Mild indistinctly marginated airspace opacity in the lingula and adjacent anteromedial segment left lower lobe. Previously there were some small acinar nodules in this region. Small bulla in the left lower lobe, stable. Right breast fatty density with scattered calcifications along its margins, stable, probably from fat necrosis. Descending thoracic aortic and circumflex coronary artery atherosclerotic calcification. Hepatobiliary: Stable hypodense hepatic lesions favoring cysts. Gallbladder unremarkable. No biliary dilatation. Pancreas: Stable atrophy. Spleen: Unremarkable Adrenals/Urinary Tract: Both adrenal glands appear unremarkable. Stable scarring in the left mid kidney posteriorly somewhat irregular ring density along the posterior capsular margin of the left kidney measures 1.8 by 1.0 cm on image 37 series 2, previously 1.9 by 0.9 cm by my measurements, essentially stable. 0.9 cm hypodense lesion in the right mid upper kidney on image 34 series 2 and separate 1.2 by 1.0 cm hypodense lesion of the right kidney upper pole, likely cysts. Exophytic complex lesion of the right kidney lower pole measuring 1.0 cm in diameter on image 47 series 2, difficult to characterize due to small size aside from the fact that this lesion appears complex and is not fluid density. Punctate calcification along the inferior margin of this lesion on image 66 series 6. Back on 05/02/2014 there is a larger complex lesion in this vicinity measuring about 1.5 by 1.5 cm. Stomach/Bowel: Mild sigmoid colon diverticulosis. Vascular/Lymphatic: Aortoiliac atherosclerotic vascular disease. Reproductive: Uterus absent.  Adnexa  unremarkable. Other: No supplemental non-categorized findings. Musculoskeletal: Lumbar spondylosis and degenerative disc disease causing multilevel impingement. IMPRESSION: 1. Prior partial left nephrectomy with a 1.8 by 1.0 cm ring-like structure along the posterior renal capsule just below the nephrectomy site. This partially corresponds to a thinner ring-like structure shown on 05/02/2014, and may well simply represent evolutionary changes from the prior partial nephrectomy. This is not changed from 08/30/2020. Surveillance of follow up renal protocol CT or (preferably) MRI recommended within the next 6 months. 2. Complex 1.0 cm lesion exophytic from the right kidney lower pole. This is at a site of a larger complex cyst shown on 05/02/2014 and the reduction in size tends to be somewhat reassuring against malignancy, but this also merits careful surveillance. No change from 08/30/2020. 3. Increased mild airspace opacity in the lingula and adjacent anteromedial segment left lower lobe. Possibilities may include hypersensitivity pneumonitis or pneumonia. 4.  Aortic Atherosclerosis (ICD10-I70.0).  Coronary atherosclerosis. 5. Sigmoid colon diverticulosis. 6. Multilevel lumbar impingement. Electronically Signed   By: WVan ClinesM.D.   On: 12/01/2020 14:58  DG Chest Portable 1 View  Result Date: 12/01/2020 CLINICAL DATA:  weakness, eval for infiltrate EXAM: PORTABLE CHEST 1 VIEW COMPARISON:  02/09/2017 FINDINGS: The heart size and mediastinal contours are within normal limits. Atherosclerotic calcification of the aortic knob. Mildly prominent bibasilar interstitial markings is similar in appearance to prior. No focal airspace consolidation, pleural effusion, or pneumothorax. Degenerative changes of the bilateral AC joints. IMPRESSION: No active disease. Electronically Signed   By: Davina Poke D.O.   On: 12/01/2020 12:46   EEG adult  Result Date: 12/01/2020 Greta Doom, MD     12/01/2020   8:30 PM History: 80 yo F being evaluated for encephalopathy Sedation: NOne Technique: This is a 21 channel routine scalp EEG performed at the bedside with bipolar and monopolar montages arranged in accordance to the international 10/20 system of electrode placement. One channel was dedicated to EKG recording. Background: There is a posterior dominant rhythm(PDR) of 9 Hz which is asymmetric, seen better on the left than right.  In addition, there is focal irregular delta range slow activity in the right posterior quadrant. Sleep is not recorded. Photic stimulation: Physiologic driving is present EEG Abnormalities: 1) Asymmetric PDR 2) Focal right posterior quadrant slow activity. Clinical Interpretation: This EEG is consistent with an area of focal cerebral dysfunction in the right posterior quadrant as can be seen with chronic stroke, though post-ictal state could also be a consideration. There was no seizure or definite evidence of seizure predisposition recorded on this study. Please note that lack of epileptiform activity on EEG does not preclude the possibility of epilepsy. Roland Rack, MD [facility] [phone] If 7pm- 7am, please page neurology on call as listed in Jud.    Microbiology: Recent Results (from the past 240 hour(s))  Resp Panel by RT-PCR (Flu A&B, Covid) Nasopharyngeal Swab     Status: None   Collection Time: 12/01/20 12:04 PM   Specimen: Nasopharyngeal Swab; Nasopharyngeal(NP) swabs in vial transport medium  Result Value Ref Range Status   SARS Coronavirus 2 by RT PCR NEGATIVE NEGATIVE Final    Comment: (NOTE) SARS-CoV-2 target nucleic acids are NOT DETECTED.  The SARS-CoV-2 RNA is generally detectable in upper respiratory specimens during the acute phase of infection. The lowest concentration of SARS-CoV-2 viral copies this assay can detect is 138 copies/mL. A negative result does not preclude SARS-Cov-2 infection and should not be used as the sole basis  for treatment or other patient management decisions. A negative result may occur with  improper specimen collection/handling, submission of specimen other than nasopharyngeal swab, presence of viral mutation(s) within the areas targeted by this assay, and inadequate number of viral copies(<138 copies/mL). A negative result must be combined with clinical observations, patient history, and epidemiological information. The expected result is Negative.  Fact Sheet for Patients:  EntrepreneurPulse.com.au  Fact Sheet for Healthcare Providers:  IncredibleEmployment.be  This test is no t yet approved or cleared by the [place] FDA and  has been authorized for detection and/or diagnosis of SARS-CoV-2 by FDA under an Emergency Use Authorization (EUA). This EUA will remain  in effect (meaning this test can be used) for the duration of the COVID-19 declaration under Section 564(b)(1) of the Act, 21 U.S.C.section 360bbb-3(b)(1), unless the authorization is terminated  or revoked sooner.       Influenza A by PCR NEGATIVE NEGATIVE Final   Influenza B by PCR NEGATIVE NEGATIVE Final    Comment: (NOTE) The Xpert Xpress SARS-CoV-2/FLU/RSV plus assay is intended as an aid  in the diagnosis of influenza from Nasopharyngeal swab specimens and should not be used as a sole basis for treatment. Nasal washings and aspirates are unacceptable for Xpert Xpress SARS-CoV-2/FLU/RSV testing.  Fact Sheet for Patients: EntrepreneurPulse.com.au  Fact Sheet for Healthcare Providers: IncredibleEmployment.be  This test is not yet approved or cleared by the [place] FDA and has been authorized for detection and/or diagnosis of SARS-CoV-2 by FDA under an Emergency Use Authorization (EUA). This EUA will remain in effect (meaning this test can be used) for the duration of the COVID-19 declaration under Section 564(b)(1) of the Act, 21  U.S.C. section 360bbb-3(b)(1), unless the authorization is terminated or revoked.  Performed at [facility], [address] [place], [place] [address]   Blood culture (routine x 2)     Status: None (Preliminary result)   Collection Time: 12/01/20  3:27 PM   Specimen: BLOOD  Result Value Ref Range Status   Specimen Description BLOOD LEFT ANTECUBITAL  Final   Special Requests   Final    BOTTLES DRAWN AEROBIC AND ANAEROBIC Blood Culture adequate volume   Culture   Final    NO GROWTH < 24 HOURS Performed at [facility], [address] [place] [address] [place], [place] [address]    Report Status PENDING  Incomplete  Blood culture (routine x 2)     Status: None (Preliminary result)   Collection Time: 12/01/20  3:27 PM   Specimen: BLOOD  Result Value Ref Range Status   Specimen Description BLOOD RIGHT ANTECUBITAL  Final   Special Requests   Final    BOTTLES DRAWN AEROBIC AND ANAEROBIC Blood Culture adequate volume   Culture   Final    NO GROWTH < 24 HOURS Performed at [facility], [place], [place] [address]    Report Status PENDING  Incomplete     Labs: Basic Metabolic Panel: Recent Labs  Lab 12/01/20 0902 12/02/20 0414 12/03/20 0500  NA 129* 133* 132*  K 3.6 3.2* 3.4*  CL 90* 94* 95*  CO2 '23 29 27  '$ GLUCOSE 123* 86 74  BUN '17 19 16  '$ CREATININE 0.70 0.66 0.74  CALCIUM 9.5 8.6* 8.3*   Liver Function Tests: Recent Labs  Lab 12/01/20 1145  AST 53*  ALT 45*  ALKPHOS 65  BILITOT 0.9  PROT 7.2  ALBUMIN 4.2   Recent Labs  Lab 12/01/20 1145  LIPASE 16   No results for input(s): AMMONIA in the last 168 hours. CBC: Recent Labs  Lab 12/01/20 0902 12/02/20 0414 12/03/20 0500  WBC 6.3 5.4 3.5*  HGB 11.9* 9.8* 9.5*  HCT 36.5 29.4* 28.9*  MCV 75.1* 74.1* 73.5*  PLT 400 355 309   Cardiac Enzymes: No results for input(s): CKTOTAL, CKMB, CKMBINDEX, TROPONINI in the last 168 hours. BNP: BNP (last 3 results) No results for  input(s): BNP in the last 8760 hours.  ProBNP (last 3 results) No results for input(s): PROBNP in the last 8760 hours.  CBG: Recent Labs  Lab 12/03/20 0741 12/03/20 1141  GLUCAP 80 94       Signed:  Domenic Polite MD.  [facility] 12/03/2020, 2:05 PM

## 2020-12-03 NOTE — Progress Notes (Signed)
Patient discharged to home. All belongings sent home with patient. Discharging teaching provided and questions answered. Patients with no complaints at discharge.

## 2020-12-06 LAB — CULTURE, BLOOD (ROUTINE X 2)
Culture: NO GROWTH
Culture: NO GROWTH
Special Requests: ADEQUATE
Special Requests: ADEQUATE

## 2020-12-29 ENCOUNTER — Encounter: Payer: Self-pay | Admitting: *Deleted

## 2021-01-01 ENCOUNTER — Ambulatory Visit
Admission: RE | Admit: 2021-01-01 | Discharge: 2021-01-01 | Disposition: A | Discharge status: Home / Self Care | Payer: Medicare Other | Attending: Gastroenterology | Admitting: Gastroenterology

## 2021-01-01 ENCOUNTER — Ambulatory Visit: Payer: Medicare Other | Admitting: Certified Registered"

## 2021-01-01 ENCOUNTER — Encounter: Payer: Self-pay | Admitting: *Deleted

## 2021-01-01 ENCOUNTER — Encounter
Admission: RE | Disposition: A | Discharge status: Home / Self Care | Payer: Self-pay | Source: Home / Self Care | Attending: Gastroenterology

## 2021-01-01 DIAGNOSIS — R634 Abnormal weight loss: Secondary | ICD-10-CM | POA: Insufficient documentation

## 2021-01-01 DIAGNOSIS — E119 Type 2 diabetes mellitus without complications: Secondary | ICD-10-CM | POA: Diagnosis not present

## 2021-01-01 DIAGNOSIS — K5730 Diverticulosis of large intestine without perforation or abscess without bleeding: Secondary | ICD-10-CM | POA: Insufficient documentation

## 2021-01-01 DIAGNOSIS — D509 Iron deficiency anemia, unspecified: Secondary | ICD-10-CM | POA: Diagnosis not present

## 2021-01-01 DIAGNOSIS — K2289 Other specified disease of esophagus: Secondary | ICD-10-CM | POA: Insufficient documentation

## 2021-01-01 DIAGNOSIS — Z888 Allergy status to other drugs, medicaments and biological substances status: Secondary | ICD-10-CM | POA: Insufficient documentation

## 2021-01-01 DIAGNOSIS — Z85528 Personal history of other malignant neoplasm of kidney: Secondary | ICD-10-CM | POA: Insufficient documentation

## 2021-01-01 DIAGNOSIS — Z7984 Long term (current) use of oral hypoglycemic drugs: Secondary | ICD-10-CM | POA: Insufficient documentation

## 2021-01-01 DIAGNOSIS — K640 First degree hemorrhoids: Secondary | ICD-10-CM | POA: Insufficient documentation

## 2021-01-01 DIAGNOSIS — K317 Polyp of stomach and duodenum: Secondary | ICD-10-CM | POA: Diagnosis not present

## 2021-01-01 DIAGNOSIS — Z801 Family history of malignant neoplasm of trachea, bronchus and lung: Secondary | ICD-10-CM | POA: Insufficient documentation

## 2021-01-01 DIAGNOSIS — K5909 Other constipation: Secondary | ICD-10-CM | POA: Diagnosis not present

## 2021-01-01 DIAGNOSIS — Z79899 Other long term (current) drug therapy: Secondary | ICD-10-CM | POA: Diagnosis not present

## 2021-01-01 DIAGNOSIS — K219 Gastro-esophageal reflux disease without esophagitis: Secondary | ICD-10-CM | POA: Insufficient documentation

## 2021-01-01 DIAGNOSIS — Z905 Acquired absence of kidney: Secondary | ICD-10-CM | POA: Diagnosis not present

## 2021-01-01 DIAGNOSIS — Z803 Family history of malignant neoplasm of breast: Secondary | ICD-10-CM | POA: Insufficient documentation

## 2021-01-01 DIAGNOSIS — Z79890 Hormone replacement therapy (postmenopausal): Secondary | ICD-10-CM | POA: Diagnosis not present

## 2021-01-01 DIAGNOSIS — Z6823 Body mass index (BMI) 23.0-23.9, adult: Secondary | ICD-10-CM | POA: Insufficient documentation

## 2021-01-01 DIAGNOSIS — K621 Rectal polyp: Secondary | ICD-10-CM | POA: Diagnosis not present

## 2021-01-01 DIAGNOSIS — Z800 Family history of malignant neoplasm of digestive organs: Secondary | ICD-10-CM | POA: Insufficient documentation

## 2021-01-01 DIAGNOSIS — Z808 Family history of malignant neoplasm of other organs or systems: Secondary | ICD-10-CM | POA: Insufficient documentation

## 2021-01-01 HISTORY — PX: COLONOSCOPY WITH PROPOFOL: SHX5780

## 2021-01-01 HISTORY — PX: ESOPHAGOGASTRODUODENOSCOPY (EGD) WITH PROPOFOL: SHX5813

## 2021-01-01 LAB — GLUCOSE, CAPILLARY: Glucose-Capillary: 96 mg/dL (ref 70–99)

## 2021-01-01 SURGERY — ESOPHAGOGASTRODUODENOSCOPY (EGD) WITH PROPOFOL
Anesthesia: General

## 2021-01-01 MED ORDER — PROPOFOL 500 MG/50ML IV EMUL
INTRAVENOUS | Status: DC | PRN
  Administered 2021-01-01: 130 ug/kg/min via INTRAVENOUS

## 2021-01-01 MED ORDER — SODIUM CHLORIDE 0.9 % IV SOLN: INTRAVENOUS | Status: DC

## 2021-01-01 MED ORDER — PROPOFOL 500 MG/50ML IV EMUL
INTRAVENOUS | Status: AC
  Filled 2021-01-01: qty 100

## 2021-01-01 MED ORDER — LIDOCAINE HCL (CARDIAC) PF 100 MG/5ML IV SOSY
PREFILLED_SYRINGE | INTRAVENOUS | Status: DC | PRN
  Administered 2021-01-01: 50 mg via INTRAVENOUS

## 2021-01-01 MED ORDER — PROPOFOL 10 MG/ML IV BOLUS
INTRAVENOUS | Status: DC | PRN
  Administered 2021-01-01: 50 mg via INTRAVENOUS

## 2021-01-01 NOTE — Interval H&P Note (Signed)
History and Physical Interval Note: Preprocedure H&P from 01/01/21  was reviewed and there was no interval change after seeing and examining the patient.  Written consent was obtained from the patient after discussion of risks, benefits, and alternatives. Patient has consented to proceed with Esophagogastroduodenoscopy and Colonoscopy with possible intervention   01/01/2021 10:15 AM  Vanessa Esparza  has presented today for surgery, with the diagnosis of Weight loss R63.4, Early saiety R68.81.  The various methods of treatment have been discussed with the patient and family. After consideration of risks, benefits and other options for treatment, the patient has consented to  Procedure(s): ESOPHAGOGASTRODUODENOSCOPY (EGD) WITH PROPOFOL (N/A) COLONOSCOPY WITH PROPOFOL (N/A) as a surgical intervention.  The patient's history has been reviewed, patient examined, no change in status, stable for surgery.  I have reviewed the patient's chart and labs.  Questions were answered to the patient's satisfaction.     Annamaria Helling

## 2021-01-01 NOTE — Op Note (Signed)
[facility] Gastroenterology Patient Name: Vanessa Esparza Procedure Date: 01/01/2021 10:19 AM MRN: [id number] Account #: [id number] Date of Birth: 1941-04-08 Admit Type: Outpatient Age: 80 Room: [facility] ENDO ROOM 2 Gender: Female Note Status: Finalized Procedure:             Upper GI endoscopy Indications:           Iron deficiency anemia, Weight loss Providers:             Rueben Bash, DO Referring MD:          Irven Easterly. Kary Kos, MD (Referring MD) Medicines:             Monitored Anesthesia Care Complications:         No immediate complications. Estimated blood loss:                         Minimal. Procedure:             Pre-Anesthesia Assessment:                        - Prior to the procedure, a History and Physical was                         performed, and patient medications and allergies were                         reviewed. The patient is competent. The risks and                         benefits of the procedure and the sedation options and                         risks were discussed with the patient. All questions                         were answered and informed consent was obtained.                         Patient identification and proposed procedure were                         verified by the physician, the nurse, the anesthetist                         and the technician in the endoscopy suite. Mental                         Status Examination: alert and oriented. Airway                         Examination: normal oropharyngeal airway and neck                         mobility. Respiratory Examination: clear to                         auscultation. CV Examination: RRR, no murmurs, no S3  or S4. Prophylactic Antibiotics: The patient does not                         require prophylactic antibiotics. Prior                         Anticoagulants: The patient has taken no previous                         anticoagulant or  antiplatelet agents. ASA Grade                         Assessment: III - A patient with severe systemic                         disease. After reviewing the risks and benefits, the                         patient was deemed in satisfactory condition to                         undergo the procedure. The anesthesia plan was to use                         monitored anesthesia care (MAC). Immediately prior to                         administration of medications, the patient was                         re-assessed for adequacy to receive sedatives. The                         heart rate, respiratory rate, oxygen saturations,                         blood pressure, adequacy of pulmonary ventilation, and                         response to care were monitored throughout the                         procedure. The physical status of the patient was                         re-assessed after the procedure.                        After obtaining informed consent, the endoscope was                         passed under direct vision. Throughout the procedure,                         the patient's blood pressure, pulse, and oxygen                         saturations were monitored continuously. The Endoscope  was introduced through the mouth, and advanced to the                         second part of duodenum. The upper GI endoscopy was                         accomplished without difficulty. The patient tolerated                         the procedure well. Findings:      Esophagogastric landmarks were identified: the gastroesophageal junction       was found at 40 cm from the incisors. Minimally irregular z line       (previously biopsied and negative for barretts, so repeat biopsies were       not performed). Tortuous esophagus. GEJ otherwise widely patent.      Multiple less than 5 mm sessile polyps with no bleeding and no stigmata       of recent bleeding were found in the  stomach. Suspicious for fundic       gland polyps. Biopsies were taken with a cold forceps for histology.       Estimated blood loss was minimal. Otherwise normal gastric mucosa      The ampulla, duodenal bulb, first portion of the duodenum and second       portion of the duodenum were normal. Biopsies for histology were taken       with a cold forceps for evaluation of celiac disease. Estimated blood       loss was minimal. Impression:            - Esophagogastric landmarks identified.                        - Multiple gastric polyps. Biopsied.                        - Normal ampulla, duodenal bulb, first portion of the                         duodenum and second portion of the duodenum. Biopsied. Recommendation:        - Discharge patient to home.                        - Resume previous diet.                        - Continue present medications.                        - Await pathology results.                        - Return to referring physician as previously                         scheduled.                        - given iron deficiency, would benefit from IV Iron  infusions since she cannot tolerated by mouth                        - avoid carafate as this may be contributing to                         constipation Procedure Code(s):     --- Professional ---                        [phone], Esophagogastroduodenoscopy, flexible,                         transoral; with biopsy, single or multiple Diagnosis Code(s):     --- Professional ---                        K31.7, Polyp of stomach and duodenum                        D50.9, Iron deficiency anemia, unspecified                        R63.4, Abnormal weight loss CPT copyright 2019 American Medical Association. All rights reserved. The codes documented in this report are preliminary and upon coder review may  be revised to meet current compliance requirements. Attending Participation:      I personally  performed the entire procedure. Volney American, DO Annamaria Helling DO, DO 01/01/2021 11:08:02 AM This report has been signed electronically. Number of Addenda: 0 Note Initiated On: 01/01/2021 10:19 AM Estimated Blood Loss:  Estimated blood loss was minimal.      [facility]

## 2021-01-01 NOTE — Anesthesia Preprocedure Evaluation (Signed)
Anesthesia Evaluation  Patient identified by MRN, date of birth, ID band Patient awake    Reviewed: Allergy & Precautions, NPO status , Patient's Chart, lab work & pertinent test results  History of Anesthesia Complications Negative for: history of anesthetic complications  Airway Mallampati: III  TM Distance: >3 FB Neck ROM: Full    Dental no notable dental hx. (+) Teeth Intact, Caps   Pulmonary neg pulmonary ROS, neg sleep apnea, neg COPD, Patient abstained from smoking.Not current smoker,    Pulmonary exam normal breath sounds clear to auscultation       Cardiovascular Exercise Tolerance: Good METShypertension, Pt. on medications (-) CAD and (-) Past MI (-) dysrhythmias  Rhythm:Regular Rate:Normal - Systolic murmurs    Neuro/Psych Seizures - (one time after "brain surgery" none for 10 years), Well Controlled,  negative psych ROS   GI/Hepatic Neg liver ROS, GERD  Medicated and Controlled,  Endo/Other  diabetes, Type 2, Oral Hypoglycemic AgentsHypothyroidism   Renal/GU Renal disease (renal CA, S/P partial nephrectomy)     Musculoskeletal   Abdominal   Peds  Hematology  (+) anemia ,   Anesthesia Other Findings Past Medical History: No date: Anemia No date: Arthritis No date: Collagen vascular disease (HCC)     Comment:  Rhematoid Arthritis No date: Diabetes mellitus without complication (HCC) No date: GERD (gastroesophageal reflux disease) No date: Gout No date: Hyperlipidemia No date: Hypertension No date: Hypothyroidism No date: Meningioma ([place]) No date: Psoriasis 05/2011: Renal cell carcinoma, left ([place])     Comment:  Left Partial Nephrectomy No date: Seizure ([place]) No date: Seizures ([place])     Comment:  LAST SEIZURE ON 02-08-09  Reproductive/Obstetrics                             Anesthesia Physical  Anesthesia Plan  ASA: 3  Anesthesia Plan: General   Post-op Pain  Management:    Induction: Intravenous  PONV Risk Score and Plan: 3 and Ondansetron, Propofol infusion and TIVA  Airway Management Planned: Natural Airway  Additional Equipment: None  Intra-op Plan:   Post-operative Plan:   Informed Consent: I have reviewed the patients History and Physical, chart, labs and discussed the procedure including the risks, benefits and alternatives for the proposed anesthesia with the patient or authorized representative who has indicated his/her understanding and acceptance.       Plan Discussed with: CRNA and Surgeon  Anesthesia Plan Comments: (Discussed risks of anesthesia with patient, including possibility of difficulty with spontaneous ventilation under anesthesia necessitating airway intervention, PONV, and rare risks such as cardiac or respiratory or neurological events, and allergic reactions. Patient understands.)        Anesthesia Quick Evaluation

## 2021-01-01 NOTE — Anesthesia Postprocedure Evaluation (Signed)
Anesthesia Post Note  Patient: Vanessa Esparza  Procedure(s) Performed: ESOPHAGOGASTRODUODENOSCOPY (EGD) WITH PROPOFOL COLONOSCOPY WITH PROPOFOL  Patient location during evaluation: Endoscopy Anesthesia Type: General Level of consciousness: awake and alert Pain management: pain level controlled Vital Signs Assessment: post-procedure vital signs reviewed and stable Respiratory status: spontaneous breathing, nonlabored ventilation, respiratory function stable and patient connected to nasal cannula oxygen Cardiovascular status: blood pressure returned to baseline and stable Postop Assessment: no apparent nausea or vomiting Anesthetic complications: no   No notable events documented.   Last Vitals:  Vitals:   01/01/21 0935 01/01/21 1103  BP: 139/89 (!) 93/42  Pulse: 85 71  Resp: 16 17  Temp: (!) 36.1 C (!) 35.9 C  SpO2: 99% 97%    Last Pain:  Vitals:   01/01/21 1103  TempSrc: Temporal  PainSc: Asleep                 Arita Miss

## 2021-01-01 NOTE — Transfer of Care (Signed)
Immediate Anesthesia Transfer of Care Note  Patient: Vanessa Esparza  Procedure(s) Performed: ESOPHAGOGASTRODUODENOSCOPY (EGD) WITH PROPOFOL COLONOSCOPY WITH PROPOFOL  Patient Location: PACU and Endoscopy Unit  Anesthesia Type:General  Level of Consciousness: drowsy  Airway & Oxygen Therapy: Patient Spontanous Breathing  Post-op Assessment: Report given to RN  Post vital signs: stable  Last Vitals:  Vitals Value Taken Time  BP    Temp    Pulse    Resp    SpO2      Last Pain:  Vitals:   01/01/21 0935  TempSrc: Temporal         Complications: No notable events documented.

## 2021-01-01 NOTE — H&P (Signed)
[facility] Pre-Procedure H&P   Patient ID: Vanessa Esparza is a 80 y.o. female.  Gastroenterology Provider: Annamaria Helling, DO  Referring Provider: Laurine Blazer, PA PCP: [name], MD  Date: 01/01/2021  HPI Ms. Vanessa Esparza is a 80 y.o. female who presents today for Esophagogastroduodenoscopy and Colonoscopy for unintentional weight loss, early satiety, nausea, and iron deficiency anemia.  Pt has noted weight loss (10-12 lbs in 1 months time), poor appetite, early satiety, + bloating. Carafate helps with this some. She has had chronic constipation for which she takes milk of magnesia. No blood in her stool. Has noted taking iron in the past constipates her.  CT in July demonstrated diverticulosis and some renal abnormalities  Last colon in 2018 with two small TA and diverticulosis Last EGD in 2018 with gastritis, tortuous esophagus, hiatal hernia, negative for HP and barretts on biopsy.  Past Medical History:  Diagnosis Date   Anemia    Arthritis    Collagen vascular disease ([place])    Rhematoid Arthritis   Diabetes mellitus without complication (HCC)    GERD (gastroesophageal reflux disease)    Gout    Hyperlipidemia    Hypertension    Hypothyroidism    Meningioma ([place] )    Psoriasis    Renal cell carcinoma, left ([place]) 05/2011   Left Partial Nephrectomy   Seizure ([place])    Seizures ([place])    LAST SEIZURE ON 02-08-09    Past Surgical History:  Procedure Laterality Date   ABDOMINAL HYSTERECTOMY     APPENDECTOMY     BACK SURGERY     LUMBAR   BRAIN MENINGIOMA EXCISION     BRAIN SURGERY     CRANIOTOMY WITH EXCISION OF SUPRATENTORIAL MENINGIOMA   COLONOSCOPY     COLONOSCOPY WITH PROPOFOL N/A 01/28/2017   Procedure: COLONOSCOPY WITH PROPOFOL;  Surgeon: Lollie Sails, MD;  Location: [facility] ENDOSCOPY;  Service: Endoscopy;  Laterality: N/A;   CRANIOTOMY     w/ excision supratentorial meningioma   ESOPHAGOGASTRODUODENOSCOPY      ESOPHAGOGASTRODUODENOSCOPY (EGD) WITH PROPOFOL N/A 01/28/2017   Procedure: ESOPHAGOGASTRODUODENOSCOPY (EGD) WITH PROPOFOL;  Surgeon: Lollie Sails, MD;  Location: [facility] ENDOSCOPY;  Service: Endoscopy;  Laterality: N/A;   HEMORRHOID SURGERY     KNEE ARTHROSCOPY Right 09/21/2015   Procedure: ARTHROSCOPY KNEE, LATERAL AND MEDIAL MENISECTOMY;  Surgeon: Hessie Knows, MD;  Location: [facility] ORS;  Service: Orthopedics;  Laterality: Right;   LAPAROSCOPIC BILATERAL SALPINGO OOPHERECTOMY Bilateral 04/12/2019   Procedure: LAPAROSCOPIC BILATERAL SALPINGO OOPHORECTOMY;  Surgeon: Ward, Honor Loh, MD;  Location: [facility] ORS;  Service: Gynecology;  Laterality: Bilateral;   PARTIAL NEPHRECTOMY Left    DUE TO RENAL CELL CARCINOMA   REDUCTION MAMMAPLASTY Bilateral    THYROID SURGERY     TOTAL KNEE ARTHROPLASTY Right 06/30/2018   Procedure: TOTAL KNEE ARTHROPLASTY-RIGHT;  Surgeon: Hessie Knows, MD;  Location: [facility] ORS;  Service: Orthopedics;  Laterality: Right;    Family History Father- history of colon and stomach cancer Mother breast cancer Brother brain and lung cancer No other h/o GI disease or malignancy   Review of Systems  Constitutional:  Positive for appetite change, fatigue and unexpected weight change. Negative for activity change and fever.  HENT:  Negative for trouble swallowing and voice change.   Respiratory:  Negative for shortness of breath and wheezing.   Cardiovascular:  Negative for chest pain and palpitations.  Gastrointestinal:  Positive for abdominal distention, constipation, nausea and vomiting. Negative for abdominal pain, anal bleeding, blood in  stool, diarrhea and rectal pain.  Musculoskeletal:  Negative for arthralgias and myalgias.  Skin:  Negative for color change and pallor.  Neurological:  Negative for dizziness, syncope and weakness.  Psychiatric/Behavioral:  Negative for agitation and confusion.   All other systems reviewed and are negative.   Medications No current  facility-administered medications on file prior to encounter.   Current Outpatient Medications on File Prior to Encounter  Medication Sig Dispense Refill   glipiZIDE (GLUCOTROL XL) 5 MG 24 hr tablet Take 5 mg by mouth daily.     levETIRAcetam (KEPPRA) 500 MG tablet Take 500 mg by mouth 2 (two) times daily.     levothyroxine (SYNTHROID, LEVOTHROID) 150 MCG tablet Take 150 mcg by mouth daily before breakfast.     lisinopril-hydrochlorothiazide (PRINZIDE,ZESTORETIC) 20-25 MG tablet Take 1 tablet by mouth daily.     mirabegron ER (MYRBETRIQ) 50 MG TB24 tablet Take 50 mg by mouth daily.     ondansetron (ZOFRAN ODT) 4 MG disintegrating tablet Take 4 mg by mouth every 8 (eight) hours as needed for nausea or vomiting.     pantoprazole (PROTONIX) 40 MG tablet Take 40 mg by mouth daily.     sucralfate (CARAFATE) 1 g tablet Take 1 g by mouth 2 (two) times daily.     acetaminophen (TYLENOL) 325 MG tablet Take 2 tablets (650 mg total) by mouth every 6 (six) hours as needed for mild pain (or Fever >/= 101).     allopurinol (ZYLOPRIM) 100 MG tablet Take 100 mg by mouth daily.      calcium carbonate (OS-CAL) 600 MG TABS tablet Take 600 mg by mouth daily.     clobetasol (TEMOVATE) 0.05 % external solution Apply topically 2 (two) times daily.     fexofenadine (ALLEGRA) 180 MG tablet Take 180 mg by mouth daily.     Fluocinolone Acetonide Scalp 0.01 % OIL Apply topically.     fluocinonide (LIDEX) 0.05 % external solution Apply topically 2 (two) times daily as needed.     folic acid (FOLVITE) [id number] MCG tablet Take 800 mcg by mouth daily as needed (upset stomach).     glucose blood test strip USE 1 STRIP TO CHECK GLUCOSE TWICE DAILY     omeprazole (PRILOSEC) 40 MG capsule Take 40 mg by mouth daily.      Pertinent medications related to GI and procedure were reviewed by me with the patient prior to the procedure   Current Facility-Administered Medications:    0.9 %  sodium chloride infusion, , Intravenous,  Continuous, Annamaria Helling, DO  sodium chloride         Allergies  Allergen Reactions   Arava [Leflunomide]     Swelling tongue and ulcers in mouth    Methotrexate Derivatives Swelling    Tongue swelling   Statins Other (See Comments)    BLOATED FEELING, CHEST FULLNESS   Allergies were reviewed by me prior to the procedure  Objective    Vitals:   01/01/21 0935  BP: 139/89  Pulse: 85  Resp: 16  Temp: (!) 96.9 F (36.1 C)  TempSrc: Temporal  SpO2: 99%  Weight: 74.8 kg  Height: '5\' 10"'$  (1.778 m)     Physical Exam Vitals and nursing note reviewed.  Constitutional:      General: She is not in acute distress.    Appearance: Normal appearance. She is normal weight. She is not ill-appearing, toxic-appearing or diaphoretic.  HENT:     Head: Normocephalic and atraumatic.  Nose: Nose normal.     Mouth/Throat:     Mouth: Mucous membranes are moist.     Pharynx: Oropharynx is clear.  Eyes:     General: No scleral icterus.    Extraocular Movements: Extraocular movements intact.  Cardiovascular:     Rate and Rhythm: Normal rate and regular rhythm.     Heart sounds: Normal heart sounds. No murmur heard.   No friction rub. No gallop.  Pulmonary:     Effort: Pulmonary effort is normal. No respiratory distress.     Breath sounds: Normal breath sounds. No wheezing, rhonchi or rales.  Abdominal:     General: Abdomen is flat. Bowel sounds are normal. There is no distension.     Palpations: Abdomen is soft.     Tenderness: no abdominal tenderness There is no guarding or rebound.  Musculoskeletal:     Cervical back: Neck supple.     Right lower leg: No edema.     Left lower leg: No edema.  Skin:    General: Skin is warm and dry.     Coloration: Skin is not jaundiced or pale.  Neurological:     General: No focal deficit present.     Mental Status: She is alert and oriented to person, place, and time. Mental status is at baseline.  Psychiatric:        Mood and  Affect: Mood normal.        Behavior: Behavior normal.        Thought Content: Thought content normal.        Judgment: Judgment normal.     Assessment:  Vanessa Esparza is a 80 y.o. female  who presents today for Esophagogastroduodenoscopy and Colonoscopy for unintentional weight loss (10-12 lbs in 1 m time), early satiety, nausea, and iron deficiency anemia.  Plan:  Esophagogastroduodenoscopy and Colonoscopy with possible intervention today  Esophagogastroduodenoscopy with possible biopsy, control of bleeding, polypectomy, and interventions as necessary has been discussed with the patient/patient representative. Informed consent was obtained from the patient/patient representative after explaining the indication, nature, and risks of the procedure including but not limited to death, bleeding, perforation, missed neoplasm/lesions, cardiorespiratory compromise, and reaction to medications. Opportunity for questions was given and appropriate answers were provided. Patient/patient representative has verbalized understanding is amenable to undergoing the procedure.  Colonoscopy with possible biopsy, control of bleeding, polypectomy, and interventions as necessary has been discussed with the patient/patient representative. Informed consent was obtained from the patient/patient representative after explaining the indication, nature, and risks of the procedure including but not limited to death, bleeding, perforation, missed neoplasm/lesions, cardiorespiratory compromise, and reaction to medications. Opportunity for questions was given and appropriate answers were provided. Patient/patient representative has verbalized understanding is amenable to undergoing the procedure.  Annamaria Helling, DO  [facility]  Portions of the record may have been created with voice recognition software. Occasional wrong-word or 'sound-a-like' substitutions may have occurred due to the inherent  limitations of voice recognition software.  Read the chart carefully and recognize, using context, where substitutions may have occurred.

## 2021-01-01 NOTE — Op Note (Signed)
[facility] Gastroenterology Patient Name: Vanessa Esparza Procedure Date: 01/01/2021 10:18 AM MRN: [id number] Account #: [id number] Date of Birth: 08-03-1940 Admit Type: Outpatient Age: 80 Room: [facility] ENDO ROOM 2 Gender: Female Note Status: Finalized Procedure:             Colonoscopy Indications:           Iron deficiency anemia, Weight loss Providers:             Rueben Bash, DO Referring MD:          Irven Easterly. Kary Kos, MD (Referring MD) Medicines:             Monitored Anesthesia Care Complications:         No immediate complications. Estimated blood loss:                         Minimal. Procedure:             Pre-Anesthesia Assessment:                        - Prior to the procedure, a History and Physical was                         performed, and patient medications and allergies were                         reviewed. The patient is competent. The risks and                         benefits of the procedure and the sedation options and                         risks were discussed with the patient. All questions                         were answered and informed consent was obtained.                         Patient identification and proposed procedure were                         verified by the physician, the nurse, the anesthetist                         and the technician in the endoscopy suite. Mental                         Status Examination: alert and oriented. Airway                         Examination: normal oropharyngeal airway and neck                         mobility. Respiratory Examination: clear to                         auscultation. CV Examination: RRR, no murmurs, no S3  or S4. Prophylactic Antibiotics: The patient does not                         require prophylactic antibiotics. Prior                         Anticoagulants: The patient has taken no previous                         anticoagulant or  antiplatelet agents. ASA Grade                         Assessment: III - A patient with severe systemic                         disease. After reviewing the risks and benefits, the                         patient was deemed in satisfactory condition to                         undergo the procedure. The anesthesia plan was to use                         monitored anesthesia care (MAC). Immediately prior to                         administration of medications, the patient was                         re-assessed for adequacy to receive sedatives. The                         heart rate, respiratory rate, oxygen saturations,                         blood pressure, adequacy of pulmonary ventilation, and                         response to care were monitored throughout the                         procedure. The physical status of the patient was                         re-assessed after the procedure.                        After obtaining informed consent, the colonoscope was                         passed under direct vision. Throughout the procedure,                         the patient's blood pressure, pulse, and oxygen                         saturations were monitored continuously. The  Colonoscope was introduced through the anus and                         advanced to the the cecum, identified by appendiceal                         orifice and ileocecal valve. The colonoscopy was                         performed without difficulty. The patient tolerated                         the procedure well. The quality of the bowel                         preparation was evaluated using the BBPS [facility] Bowel                         Preparation Scale) with scores of: Right Colon = 2                         (minor amount of residual staining, small fragments of                         stool and/or opaque liquid, but mucosa seen well),                         Transverse Colon =  2 (minor amount of residual                         staining, small fragments of stool and/or opaque                         liquid, but mucosa seen well) and Left Colon = 3                         (entire mucosa seen well with no residual staining,                         small fragments of stool or opaque liquid). The total                         BBPS score equals 7. The quality of the bowel                         preparation was good. The ileocecal valve, appendiceal                         orifice, and rectum were photographed. Findings:      The perianal and digital rectal examinations were normal. Pertinent       negatives include normal sphincter tone.      A few small-mouthed diverticula were found in the left colon. Estimated       blood loss: none.      A 2 mm polyp was found in the rectum. The polyp was sessile. The polyp       was removed with a cold biopsy  forceps. Resection and retrieval were       complete. Estimated blood loss was minimal.      The exam was otherwise without abnormality on direct and retroflexion       views.      Non-bleeding internal hemorrhoids were found during retroflexion. The       hemorrhoids were Grade I (internal hemorrhoids that do not prolapse). Impression:            - Diverticulosis in the left colon.                        - One 2 mm polyp in the rectum, removed with a cold                         biopsy forceps. Resected and retrieved.                        - Non bleeding internal hemorrhoids noted during                         retroflexion                        - The examination was otherwise normal on direct and                         retroflexion views. Recommendation:        - Discharge patient to home.                        - Resume previous diet.                        - Continue present medications.                        - Await pathology results.                        - Repeat colonoscopy for surveillance based on                          pathology results.                        - Return to referring physician as previously                         scheduled. Procedure Code(s):     --- Professional ---                        [id number], Colonoscopy, flexible; with biopsy, single or                         multiple Diagnosis Code(s):     --- Professional ---                        K62.1, Rectal polyp                        D50.9, Iron deficiency anemia, unspecified  R63.4, Abnormal weight loss                        K57.30, Diverticulosis of large intestine without                         perforation or abscess without bleeding CPT copyright 2019 American Medical Association. All rights reserved. The codes documented in this report are preliminary and upon coder review may  be revised to meet current compliance requirements. Attending Participation:      I personally performed the entire procedure. Volney American, DO Annamaria Helling DO, DO 01/01/2021 11:12:25 AM This report has been signed electronically. Number of Addenda: 0 Note Initiated On: 01/01/2021 10:18 AM Scope Withdrawal Time: 0 hours 15 minutes 21 seconds  Total Procedure Duration: 0 hours 24 minutes 45 seconds  Estimated Blood Loss:  Estimated blood loss was minimal.      [facility]

## 2021-01-02 ENCOUNTER — Encounter: Payer: Self-pay | Admitting: Gastroenterology

## 2021-01-02 LAB — SURGICAL PATHOLOGY

## 2021-03-26 ENCOUNTER — Ambulatory Visit: Payer: Medicare Other | Admitting: Urology

## 2021-03-26 ENCOUNTER — Other Ambulatory Visit: Payer: Self-pay

## 2021-03-26 ENCOUNTER — Other Ambulatory Visit: Payer: Self-pay | Admitting: *Deleted

## 2021-03-26 ENCOUNTER — Encounter: Payer: Self-pay | Admitting: Urology

## 2021-03-26 VITALS — BP 124/70 | HR 78 | Ht 70.0 in | Wt 170.0 lb

## 2021-03-26 DIAGNOSIS — N3000 Acute cystitis without hematuria: Secondary | ICD-10-CM

## 2021-03-26 DIAGNOSIS — N3281 Overactive bladder: Secondary | ICD-10-CM

## 2021-03-26 LAB — MICROSCOPIC EXAMINATION: WBC, UA: >30 /hpf — AB (ref 0–5)

## 2021-03-26 LAB — URINALYSIS, COMPLETE
Bilirubin, UA: NEGATIVE
Glucose, UA: NEGATIVE
Ketones, UA: NEGATIVE
Nitrite, UA: NEGATIVE
Specific Gravity, UA: 1.02 (ref 1.005–1.030)
Urobilinogen, Ur: 0.2 mg/dL (ref 0.2–1.0)
pH, UA: 5.5 (ref 5.0–7.5)

## 2021-03-26 MED ORDER — SULFAMETHOXAZOLE-TRIMETHOPRIM 800-160 MG PO TABS: 1.0000 | ORAL_TABLET | Freq: Two times a day (BID) | ORAL | 0 refills | Status: AC

## 2021-03-26 MED ORDER — MIRABEGRON ER 50 MG PO TB24: 50.0000 mg | ORAL_TABLET | Freq: Every day | ORAL | 3 refills | Status: DC

## 2021-03-26 NOTE — Progress Notes (Signed)
03/26/2021 9:19 AM   Vanessa Esparza Dec 26, 1940 [id number]  Referring provider: [name], MD [address] [place],  [place] [address]  Chief Complaint  Patient presents with   Other    Urologic history:  1. 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  Overactive bladder with urge incontinence -Myrbetriq 25 mg daily   HPI: 80 y.o. female presents for annual follow-up.  Recent onset urinary frequency, dysuria and worsening urge incontinence Had UA at [facility] last week which was unremarkable Her symptoms have persisted Epic review with only 1 urine culture ordered since her last visit which had no significant growth Remains on Myrbetriq though requesting a possible alternative medication due to cost Denies gross hematuria No flank, abdominal or pelvic pain   PMH: Past Medical History:  Diagnosis Date   Anemia    Arthritis    Collagen vascular disease ([place])    Rhematoid Arthritis   Diabetes mellitus without complication (HCC)    GERD (gastroesophageal reflux disease)    Gout    Hyperlipidemia    Hypertension    Hypothyroidism    Meningioma ([place])    Psoriasis    Renal cell carcinoma, left ([place]) 05/2011   Left Partial Nephrectomy   Seizure ([place])    Seizures ([place])    LAST SEIZURE ON 02-08-09    Surgical History: Past Surgical History:  Procedure Laterality Date   ABDOMINAL HYSTERECTOMY     APPENDECTOMY     BACK SURGERY     LUMBAR   BRAIN MENINGIOMA EXCISION     BRAIN SURGERY     CRANIOTOMY WITH EXCISION OF SUPRATENTORIAL MENINGIOMA   COLONOSCOPY     COLONOSCOPY WITH PROPOFOL N/A 01/28/2017   Procedure: COLONOSCOPY WITH PROPOFOL;  Surgeon: Lollie Sails, MD;  Location: [facility] ENDOSCOPY;  Service: Endoscopy;  Laterality: N/A;   COLONOSCOPY WITH PROPOFOL N/A 01/01/2021   Procedure: COLONOSCOPY WITH PROPOFOL;  Surgeon: Annamaria Helling, DO;  Location: [facility]  ENDOSCOPY;  Service: Gastroenterology;  Laterality: N/A;   CRANIOTOMY     w/ excision supratentorial meningioma   ESOPHAGOGASTRODUODENOSCOPY     ESOPHAGOGASTRODUODENOSCOPY (EGD) WITH PROPOFOL N/A 01/28/2017   Procedure: ESOPHAGOGASTRODUODENOSCOPY (EGD) WITH PROPOFOL;  Surgeon: Lollie Sails, MD;  Location: [facility] ENDOSCOPY;  Service: Endoscopy;  Laterality: N/A;   ESOPHAGOGASTRODUODENOSCOPY (EGD) WITH PROPOFOL N/A 01/01/2021   Procedure: ESOPHAGOGASTRODUODENOSCOPY (EGD) WITH PROPOFOL;  Surgeon: Annamaria Helling, DO;  Location[facility] ENDOSCOPY;  Service: Gastroenterology;  Laterality: N/A;   HEMORRHOID SURGERY     KNEE ARTHROSCOPY Right 09/21/2015   Procedure: ARTHROSCOPY KNEE, LATERAL AND MEDIAL MENISECTOMY;  Surgeon: Hessie Knows, MD;  Location: [facility] ORS;  Service: Orthopedics;  Laterality: Right;   LAPAROSCOPIC BILATERAL SALPINGO OOPHERECTOMY Bilateral 04/12/2019   Procedure: LAPAROSCOPIC BILATERAL SALPINGO OOPHORECTOMY;  Surgeon: Ward, Honor Loh, MD;  Location: [facility] ORS;  Service: Gynecology;  Laterality: Bilateral;   PARTIAL NEPHRECTOMY Left    DUE TO RENAL CELL CARCINOMA   REDUCTION MAMMAPLASTY Bilateral    THYROID SURGERY     TOTAL KNEE ARTHROPLASTY Right 06/30/2018   Procedure: TOTAL KNEE ARTHROPLASTY-RIGHT;  Surgeon: Hessie Knows, MD;  Location: [facility] ORS;  Service: Orthopedics;  Laterality: Right;    Home Medications:  Allergies as of 03/26/2021       Reactions   Arava [leflunomide]    Swelling tongue and ulcers in mouth    Methotrexate Derivatives Swelling   Tongue swelling   Statins Other (See  Comments)   BLOATED FEELING, CHEST FULLNESS        Medication List        Accurate as of March 26, 2021  9:19 AM. If you have any questions, ask your nurse or doctor.          acetaminophen 325 MG tablet Commonly known as: TYLENOL Take 2 tablets (650 mg total) by mouth every 6 (six) hours as needed for mild pain (or Fever >/= 101).   allopurinol 100 MG  tablet Commonly known as: ZYLOPRIM Take 100 mg by mouth daily.   calcium carbonate 600 MG Tabs tablet Commonly known as: OS-CAL Take 600 mg by mouth daily.   clobetasol 0.05 % external solution Commonly known as: TEMOVATE Apply topically 2 (two) times daily.   fexofenadine 180 MG tablet Commonly known as: ALLEGRA Take 180 mg by mouth daily.   Fluocinolone Acetonide Scalp 0.01 % Oil Apply topically.   fluocinonide 0.05 % external solution Commonly known as: LIDEX Apply topically 2 (two) times daily as needed.   folic acid 371 MCG tablet Commonly known as: FOLVITE Take 800 mcg by mouth daily as needed (upset stomach).   glipiZIDE 5 MG 24 hr tablet Commonly known as: GLUCOTROL XL Take 5 mg by mouth daily.   glucose blood test strip USE 1 STRIP TO CHECK GLUCOSE TWICE DAILY   levETIRAcetam 500 MG tablet Commonly known as: KEPPRA Take 500 mg by mouth 2 (two) times daily.   levothyroxine 150 MCG tablet Commonly known as: SYNTHROID Take 150 mcg by mouth daily before breakfast.   lisinopril-hydrochlorothiazide 20-25 MG tablet Commonly known as: ZESTORETIC Take 1 tablet by mouth daily.   mirabegron ER 50 MG Tb24 tablet Commonly known as: MYRBETRIQ Take 50 mg by mouth daily.   omeprazole 40 MG capsule Commonly known as: PRILOSEC Take 40 mg by mouth daily.   ondansetron 4 MG disintegrating tablet Commonly known as: ZOFRAN-ODT Take 4 mg by mouth every 8 (eight) hours as needed for nausea or vomiting.   pantoprazole 40 MG tablet Commonly known as: PROTONIX Take 40 mg by mouth daily.   sucralfate 1 g tablet Commonly known as: CARAFATE Take 1 g by mouth 2 (two) times daily.        Allergies:  Allergies  Allergen Reactions   Arava [Leflunomide]     Swelling tongue and ulcers in mouth    Methotrexate Derivatives Swelling    Tongue swelling   Statins Other (See Comments)    BLOATED FEELING, CHEST FULLNESS    Family History: Family History  Problem  Relation Age of Onset   Breast cancer Other    Breast cancer Mother     Social History:  reports that she has never smoked. She has never used smokeless tobacco. She reports that she does not drink alcohol and does not use drugs.   Physical Exam: BP 124/70   Pulse 78   Ht 5\' 10"  (1.778 m)   Wt 170 lb (77.1 kg)   BMI 24.39 kg/m   Constitutional:  Alert and oriented, No acute distress. HEENT: [place] AT, moist mucus membranes.  Trachea midline, no masses. Cardiovascular: No clubbing, cyanosis, or edema. Respiratory: Normal respiratory effort, no increased work of breathing. Psychiatric: Normal mood and affect.  Laboratory Data:  Urinalysis 03/26/2021: Dipstick 1+ blood, 1+ protein, 2+ leukocytes/microscopy >30 WBC   Assessment & Plan:    1.  Acute cystitis Urine culture ordered Rx Septra DS bid x7 days  2. Overactive bladder I offered her  a trial of extended release trospium however it looks like this will be the same price on her plan as the Myrbetriq. She will be contacted by office staff to see if she wants to try the trospium or just continue the Myrbetriq.   Continue annual follow-up   Abbie Sons, MD  [facility] [address] [place], [place] [address] [phone]

## 2021-03-30 ENCOUNTER — Other Ambulatory Visit: Payer: Self-pay | Admitting: *Deleted

## 2021-03-30 ENCOUNTER — Telehealth: Payer: Self-pay | Admitting: *Deleted

## 2021-03-30 LAB — CULTURE, URINE COMPREHENSIVE

## 2021-03-30 MED ORDER — NITROFURANTOIN MONOHYD MACRO 100 MG PO CAPS: 100.0000 mg | ORAL_CAPSULE | Freq: Two times a day (BID) | ORAL | 0 refills | Status: AC

## 2021-03-30 NOTE — Telephone Encounter (Signed)
Notified patient as instructed, patient pleased. Discussed follow-up appointments, patient agrees  

## 2021-03-30 NOTE — Telephone Encounter (Signed)
-----   Message from Abbie Sons, MD sent at 03/30/2021  7:02 AM EST ----- She did grow a second bacteria in her final urine culture report.  Please send in Rx Macrobid 100 mg twice daily x5 days but do not have her start until she completes her Septra DS.

## 2021-08-26 ENCOUNTER — Observation Stay: Payer: Medicare Other

## 2021-08-26 ENCOUNTER — Other Ambulatory Visit: Payer: Self-pay

## 2021-08-26 ENCOUNTER — Encounter: Payer: Self-pay | Admitting: Internal Medicine

## 2021-08-26 ENCOUNTER — Inpatient Hospital Stay
Admission: EM | Admit: 2021-08-26 | Discharge: 2021-08-30 | DRG: 101 | Disposition: A | Discharge status: Home / Self Care | Payer: Medicare Other | Attending: Internal Medicine | Admitting: Internal Medicine

## 2021-08-26 DIAGNOSIS — G9341 Metabolic encephalopathy: Secondary | ICD-10-CM | POA: Diagnosis present

## 2021-08-26 DIAGNOSIS — Z7984 Long term (current) use of oral hypoglycemic drugs: Secondary | ICD-10-CM

## 2021-08-26 DIAGNOSIS — Z86011 Personal history of benign neoplasm of the brain: Secondary | ICD-10-CM

## 2021-08-26 DIAGNOSIS — R4182 Altered mental status, unspecified: Secondary | ICD-10-CM | POA: Diagnosis not present

## 2021-08-26 DIAGNOSIS — E119 Type 2 diabetes mellitus without complications: Secondary | ICD-10-CM

## 2021-08-26 DIAGNOSIS — K219 Gastro-esophageal reflux disease without esophagitis: Secondary | ICD-10-CM | POA: Diagnosis present

## 2021-08-26 DIAGNOSIS — E876 Hypokalemia: Secondary | ICD-10-CM | POA: Diagnosis present

## 2021-08-26 DIAGNOSIS — Z20822 Contact with and (suspected) exposure to covid-19: Secondary | ICD-10-CM | POA: Diagnosis present

## 2021-08-26 DIAGNOSIS — G4089 Other seizures: Principal | ICD-10-CM | POA: Diagnosis present

## 2021-08-26 DIAGNOSIS — Z86018 Personal history of other benign neoplasm: Secondary | ICD-10-CM

## 2021-08-26 DIAGNOSIS — E785 Hyperlipidemia, unspecified: Secondary | ICD-10-CM | POA: Diagnosis present

## 2021-08-26 DIAGNOSIS — Z87898 Personal history of other specified conditions: Secondary | ICD-10-CM

## 2021-08-26 DIAGNOSIS — Z85528 Personal history of other malignant neoplasm of kidney: Secondary | ICD-10-CM

## 2021-08-26 DIAGNOSIS — Z803 Family history of malignant neoplasm of breast: Secondary | ICD-10-CM

## 2021-08-26 DIAGNOSIS — M069 Rheumatoid arthritis, unspecified: Secondary | ICD-10-CM | POA: Diagnosis present

## 2021-08-26 DIAGNOSIS — R569 Unspecified convulsions: Secondary | ICD-10-CM

## 2021-08-26 DIAGNOSIS — Z905 Acquired absence of kidney: Secondary | ICD-10-CM

## 2021-08-26 DIAGNOSIS — G9340 Encephalopathy, unspecified: Principal | ICD-10-CM

## 2021-08-26 DIAGNOSIS — T502X5A Adverse effect of carbonic-anhydrase inhibitors, benzothiadiazides and other diuretics, initial encounter: Secondary | ICD-10-CM | POA: Diagnosis present

## 2021-08-26 DIAGNOSIS — R55 Syncope and collapse: Secondary | ICD-10-CM

## 2021-08-26 DIAGNOSIS — M109 Gout, unspecified: Secondary | ICD-10-CM | POA: Diagnosis present

## 2021-08-26 DIAGNOSIS — Z96651 Presence of right artificial knee joint: Secondary | ICD-10-CM | POA: Diagnosis present

## 2021-08-26 DIAGNOSIS — W19XXXA Unspecified fall, initial encounter: Secondary | ICD-10-CM | POA: Diagnosis present

## 2021-08-26 DIAGNOSIS — E079 Disorder of thyroid, unspecified: Secondary | ICD-10-CM | POA: Diagnosis present

## 2021-08-26 DIAGNOSIS — R509 Fever, unspecified: Secondary | ICD-10-CM

## 2021-08-26 DIAGNOSIS — Z90710 Acquired absence of both cervix and uterus: Secondary | ICD-10-CM

## 2021-08-26 DIAGNOSIS — E871 Hypo-osmolality and hyponatremia: Secondary | ICD-10-CM

## 2021-08-26 DIAGNOSIS — R531 Weakness: Secondary | ICD-10-CM

## 2021-08-26 DIAGNOSIS — I10 Essential (primary) hypertension: Secondary | ICD-10-CM | POA: Diagnosis present

## 2021-08-26 DIAGNOSIS — Z79899 Other long term (current) drug therapy: Secondary | ICD-10-CM

## 2021-08-26 DIAGNOSIS — E039 Hypothyroidism, unspecified: Secondary | ICD-10-CM | POA: Diagnosis present

## 2021-08-26 LAB — URINALYSIS, ROUTINE W REFLEX MICROSCOPIC
Bacteria, UA: NONE SEEN
Bilirubin Urine: NEGATIVE
Glucose, UA: NEGATIVE mg/dL
Ketones, ur: 20 mg/dL — AB
Leukocytes,Ua: NEGATIVE
Nitrite: NEGATIVE
Protein, ur: 100 mg/dL — AB
Specific Gravity, Urine: 1.023 (ref 1.005–1.030)
pH: 5 (ref 5.0–8.0)

## 2021-08-26 LAB — CBC WITH DIFFERENTIAL/PLATELET
Abs Immature Granulocytes: 0.06 10*3/uL (ref 0.00–0.07)
Basophils Absolute: 0 10*3/uL (ref 0.0–0.1)
Basophils Relative: 0 %
Eosinophils Absolute: 0 10*3/uL (ref 0.0–0.5)
Eosinophils Relative: 0 %
HCT: 34.3 % — ABNORMAL LOW (ref 36.0–46.0)
Hemoglobin: 11.3 g/dL — ABNORMAL LOW (ref 12.0–15.0)
Immature Granulocytes: 1 %
Lymphocytes Relative: 7 %
Lymphs Abs: 0.7 10*3/uL (ref 0.7–4.0)
MCH: 24.2 pg — ABNORMAL LOW (ref 26.0–34.0)
MCHC: 32.9 g/dL (ref 30.0–36.0)
MCV: 73.6 fL — ABNORMAL LOW (ref 80.0–100.0)
Monocytes Absolute: 0.7 10*3/uL (ref 0.1–1.0)
Monocytes Relative: 7 %
Neutro Abs: 8.1 10*3/uL — ABNORMAL HIGH (ref 1.7–7.7)
Neutrophils Relative %: 85 %
Platelets: 329 10*3/uL (ref 150–400)
RBC: 4.66 MIL/uL (ref 3.87–5.11)
RDW: 13.1 % (ref 11.5–15.5)
WBC: 9.6 10*3/uL (ref 4.0–10.5)
nRBC: 0 % (ref 0.0–0.2)

## 2021-08-26 LAB — COMPREHENSIVE METABOLIC PANEL
ALT: 19 U/L (ref 0–44)
AST: 27 U/L (ref 15–41)
Albumin: 3.8 g/dL (ref 3.5–5.0)
Alkaline Phosphatase: 53 U/L (ref 38–126)
Anion gap: 12 (ref 5–15)
BUN: 20 mg/dL (ref 8–23)
CO2: 28 mmol/L (ref 22–32)
Calcium: 9 mg/dL (ref 8.9–10.3)
Chloride: 89 mmol/L — ABNORMAL LOW (ref 98–111)
Creatinine, Ser: 0.96 mg/dL (ref 0.44–1.00)
GFR, Estimated: 60 mL/min — ABNORMAL LOW (ref >60)
Glucose, Bld: 115 mg/dL — ABNORMAL HIGH (ref 70–99)
Potassium: 3.1 mmol/L — ABNORMAL LOW (ref 3.5–5.1)
Sodium: 129 mmol/L — ABNORMAL LOW (ref 135–145)
Total Bilirubin: 0.9 mg/dL (ref 0.3–1.2)
Total Protein: 6.7 g/dL (ref 6.5–8.1)

## 2021-08-26 LAB — BLOOD GAS, ARTERIAL
Acid-Base Excess: 4.2 mmol/L — ABNORMAL HIGH (ref 0.0–2.0)
Bicarbonate: 27.3 mmol/L (ref 20.0–28.0)
O2 Saturation: 97.2 %
Patient temperature: 37
pCO2 arterial: 35 mmHg (ref 32–48)
pH, Arterial: 7.5 — ABNORMAL HIGH (ref 7.35–7.45)
pO2, Arterial: 77 mmHg — ABNORMAL LOW (ref 83–108)

## 2021-08-26 LAB — MAGNESIUM: Magnesium: 1.6 mg/dL — ABNORMAL LOW (ref 1.7–2.4)

## 2021-08-26 LAB — TROPONIN I (HIGH SENSITIVITY)
Troponin I (High Sensitivity): 34 ng/L — ABNORMAL HIGH (ref <18)
Troponin I (High Sensitivity): 29 ng/L — ABNORMAL HIGH (ref <18)

## 2021-08-26 LAB — CK: Total CK: 420 U/L — ABNORMAL HIGH (ref 38–234)

## 2021-08-26 LAB — CBG MONITORING, ED: Glucose-Capillary: 101 mg/dL — ABNORMAL HIGH (ref 70–99)

## 2021-08-26 LAB — GLUCOSE, CAPILLARY: Glucose-Capillary: 115 mg/dL — ABNORMAL HIGH (ref 70–99)

## 2021-08-26 MED ORDER — PANTOPRAZOLE SODIUM 40 MG PO TBEC: 40.0000 mg | DELAYED_RELEASE_TABLET | Freq: Every day | ORAL | Status: DC

## 2021-08-26 MED ORDER — LEVETIRACETAM 500 MG PO TABS
500.0000 mg | ORAL_TABLET | Freq: Two times a day (BID) | ORAL | Status: DC
  Administered 2021-08-26: 500 mg via ORAL
  Filled 2021-08-26: qty 1

## 2021-08-26 MED ORDER — MIRABEGRON ER 50 MG PO TB24
50.0000 mg | ORAL_TABLET | Freq: Every day | ORAL | Status: DC
Start: 2021-08-27 — End: 2021-08-30
  Administered 2021-08-27 – 2021-08-30 (×4): 50 mg via ORAL
  Filled 2021-08-26 (×4): qty 1

## 2021-08-26 MED ORDER — PANTOPRAZOLE SODIUM 40 MG IV SOLR
40.0000 mg | Freq: Two times a day (BID) | INTRAVENOUS | Status: DC
  Administered 2021-08-26 – 2021-08-29 (×7): 40 mg via INTRAVENOUS
  Filled 2021-08-26 (×8): qty 10

## 2021-08-26 MED ORDER — ACETAMINOPHEN 325 MG PO TABS
650.0000 mg | ORAL_TABLET | Freq: Four times a day (QID) | ORAL | Status: DC | PRN
  Administered 2021-08-28 – 2021-08-29 (×2): 650 mg via ORAL
  Filled 2021-08-26 (×2): qty 2

## 2021-08-26 MED ORDER — LEVOTHYROXINE SODIUM 50 MCG PO TABS
150.0000 ug | ORAL_TABLET | Freq: Every day | ORAL | Status: DC
  Administered 2021-08-27 – 2021-08-30 (×4): 150 ug via ORAL
  Filled 2021-08-26 (×4): qty 1

## 2021-08-26 MED ORDER — ALLOPURINOL 100 MG PO TABS
100.0000 mg | ORAL_TABLET | Freq: Every day | ORAL | Status: DC
  Administered 2021-08-27 – 2021-08-30 (×4): 100 mg via ORAL
  Filled 2021-08-26 (×4): qty 1

## 2021-08-26 MED ORDER — ENOXAPARIN SODIUM 40 MG/0.4ML IJ SOSY
40.0000 mg | PREFILLED_SYRINGE | Freq: Every day | INTRAMUSCULAR | Status: DC
  Administered 2021-08-26 – 2021-08-30 (×5): 40 mg via SUBCUTANEOUS
  Filled 2021-08-26 (×5): qty 0.4

## 2021-08-26 MED ORDER — ONDANSETRON HCL 4 MG PO TABS: 4.0000 mg | ORAL_TABLET | Freq: Four times a day (QID) | ORAL | Status: DC | PRN

## 2021-08-26 MED ORDER — SODIUM CHLORIDE 0.9 % IV BOLUS
1000.0000 mL | Freq: Once | INTRAVENOUS | Status: AC
Start: 2021-08-26 — End: 2021-08-26
  Administered 2021-08-26: 1000 mL via INTRAVENOUS

## 2021-08-26 MED ORDER — POTASSIUM CHLORIDE 20 MEQ PO PACK
40.0000 meq | PACK | ORAL | Status: AC
  Administered 2021-08-26: 40 meq via ORAL
  Filled 2021-08-26: qty 2

## 2021-08-26 MED ORDER — ONDANSETRON HCL 4 MG/2ML IJ SOLN: 4.0000 mg | Freq: Four times a day (QID) | INTRAMUSCULAR | Status: DC | PRN

## 2021-08-26 MED ORDER — LEVETIRACETAM IN NACL 500 MG/100ML IV SOLN
500.0000 mg | Freq: Two times a day (BID) | INTRAVENOUS | Status: DC
  Administered 2021-08-26 – 2021-08-29 (×7): 500 mg via INTRAVENOUS
  Filled 2021-08-26 (×8): qty 100

## 2021-08-26 MED ORDER — CALCIUM CARBONATE 1250 (500 CA) MG PO TABS
625.0000 mg | ORAL_TABLET | Freq: Every day | ORAL | Status: DC
  Administered 2021-08-27 – 2021-08-29 (×3): 625 mg via ORAL
  Filled 2021-08-26 (×4): qty 0.5

## 2021-08-26 MED ORDER — LISINOPRIL 20 MG PO TABS
20.0000 mg | ORAL_TABLET | Freq: Every day | ORAL | Status: DC
  Administered 2021-08-26 – 2021-08-30 (×5): 20 mg via ORAL
  Filled 2021-08-26 (×3): qty 1
  Filled 2021-08-26: qty 2
  Filled 2021-08-26: qty 1

## 2021-08-26 MED ORDER — SUCRALFATE 1 G PO TABS
1.0000 g | ORAL_TABLET | Freq: Two times a day (BID) | ORAL | Status: DC
  Administered 2021-08-27 – 2021-08-30 (×7): 1 g via ORAL
  Filled 2021-08-26 (×8): qty 1

## 2021-08-26 MED ORDER — POTASSIUM CHLORIDE IN NACL 40-0.9 MEQ/L-% IV SOLN
INTRAVENOUS | Status: AC
  Filled 2021-08-26: qty 1000

## 2021-08-26 MED ORDER — FOLIC ACID 1 MG PO TABS
1000.0000 ug | ORAL_TABLET | Freq: Every day | ORAL | Status: DC
  Administered 2021-08-27 – 2021-08-30 (×4): 1 mg via ORAL
  Filled 2021-08-26 (×4): qty 1

## 2021-08-26 MED ORDER — LORATADINE 10 MG PO TABS
10.0000 mg | ORAL_TABLET | Freq: Every day | ORAL | Status: DC
  Administered 2021-08-27 – 2021-08-30 (×4): 10 mg via ORAL
  Filled 2021-08-26 (×4): qty 1

## 2021-08-26 NOTE — Assessment & Plan Note (Addendum)
Appears chronic ?

## 2021-08-26 NOTE — H&P (Signed)
?History and Physical  ? ? ?Patient: Vanessa Esparza AYT:[id number] DOB: 1941-02-04 ?DOA: 08/26/2021 ?DOS: the patient was seen and examined on 08/26/2021 ?PCP: [name], MD  ?Patient coming from: Home ? ?Chief Complaint:  ?Chief Complaint  ?Patient presents with  ? Fall  ? ?Most of the history was obtained from patient's husband over the phone as she is unable to provide any history ?HPI: Vanessa Esparza is a 81 y.o. female with medical history significant for hypertension, hypothyroidism, meningioma, GERD, history of renal cell carcinoma status post left partial nephrectomy, diabetes mellitus who was brought into the ER by EMS for evaluation after she was found on the floor at home by her husband. ?Per patient's husband, they sleep in separate rooms and patient had gone to bed at about 8:30 PM.  He was about to go to bed at 10:30 PM and stopped by her room to check on her and found her on the floor.  He states that patient was unable to tell him if she fell and actually thought that she was laying in bed.  He thinks she fell going to the bathroom.  He was unable to get her up and so called EMS. ?He states that patient has not felt well for the last several days and has had nausea without any vomiting.  She has had poor oral intake and appears to be weak.  At baseline she does not use an assist device for ambulation. ?He states that she had a similar episode about 8 months ago and at that time was admitted to the hospital with altered mental status and treated for pneumonia with improvement in her symptoms. ?I am unable to do a review of systems on this patient. ?She is noted to have hypokalemia as well as hyponatremia will be admitted to the hospital for further evaluation. ?Review of Systems: unable to review all systems due to the inability of the patient to answer questions. ?Past Medical History:  ?Diagnosis Date  ? Anemia   ? Arthritis   ? Collagen vascular disease ([place])   ? Rhematoid Arthritis  ?  Diabetes mellitus without complication ([place])   ? GERD (gastroesophageal reflux disease)   ? Gout   ? Hyperlipidemia   ? Hypertension   ? Hypothyroidism   ? Meningioma ([place])   ? Psoriasis   ? Renal cell carcinoma, left ([place]) 05/2011  ? Left Partial Nephrectomy  ? Seizure ([place])   ? Seizures ([place])   ? LAST SEIZURE ON 02-08-09  ? ?Past Surgical History:  ?Procedure Laterality Date  ? ABDOMINAL HYSTERECTOMY    ? APPENDECTOMY    ? BACK SURGERY    ? LUMBAR  ? BRAIN MENINGIOMA EXCISION    ? BRAIN SURGERY    ? CRANIOTOMY WITH EXCISION OF SUPRATENTORIAL MENINGIOMA  ? COLONOSCOPY    ? COLONOSCOPY WITH PROPOFOL N/A 01/28/2017  ? Procedure: COLONOSCOPY WITH PROPOFOL;  Surgeon: Lollie Sails, MD;  Location: [facility] ENDOSCOPY;  Service: Endoscopy;  Laterality: N/A;  ? COLONOSCOPY WITH PROPOFOL N/A 01/01/2021  ? Procedure: COLONOSCOPY WITH PROPOFOL;  Surgeon: Annamaria Helling, DO;  Location: [facility] ENDOSCOPY;  Service: Gastroenterology;  Laterality: N/A;  ? CRANIOTOMY    ? w/ excision supratentorial meningioma  ? ESOPHAGOGASTRODUODENOSCOPY    ? ESOPHAGOGASTRODUODENOSCOPY (EGD) WITH PROPOFOL N/A 01/28/2017  ? Procedure: ESOPHAGOGASTRODUODENOSCOPY (EGD) WITH PROPOFOL;  Surgeon: Lollie Sails, MD;  Location: [facility] ENDOSCOPY;  Service: Endoscopy;  Laterality: N/A;  ? ESOPHAGOGASTRODUODENOSCOPY (EGD) WITH PROPOFOL N/A 01/01/2021  ? Procedure:  ESOPHAGOGASTRODUODENOSCOPY (EGD) WITH PROPOFOL;  Surgeon: Annamaria Helling, DO;  Location: [place];  Service: Gastroenterology;  Laterality: N/A;  ? HEMORRHOID SURGERY    ? KNEE ARTHROSCOPY Right 09/21/2015  ? Procedure: ARTHROSCOPY KNEE, LATERAL AND MEDIAL MENISECTOMY;  Surgeon: Hessie Knows, MD;  Location: [facility] ORS;  Service: Orthopedics;  Laterality: Right;  ? LAPAROSCOPIC BILATERAL SALPINGO OOPHERECTOMY Bilateral 04/12/2019  ? Procedure: LAPAROSCOPIC BILATERAL SALPINGO OOPHORECTOMY;  Surgeon: Ward, Honor Loh, MD;  Location: [facility] ORS;  Service: Gynecology;  Laterality:  Bilateral;  ? PARTIAL NEPHRECTOMY Left   ? DUE TO RENAL CELL CARCINOMA  ? REDUCTION MAMMAPLASTY Bilateral   ? THYROID SURGERY    ? TOTAL KNEE ARTHROPLASTY Right 06/30/2018  ? Procedure: TOTAL KNEE ARTHROPLASTY-RIGHT;  Surgeon: Hessie Knows, MD;  Location: [facility] ORS;  Service: Orthopedics;  Laterality: Right;  ? ?Social History:  reports that she has never smoked. She has never used smokeless tobacco. She reports that she does not drink alcohol and does not use drugs. ? ?Allergies  ?Allergen Reactions  ? Arava [Leflunomide]   ?  Swelling tongue and ulcers in mouth   ? Methotrexate Derivatives Swelling  ?  Tongue swelling  ? Statins Other (See Comments)  ?  BLOATED FEELING, CHEST FULLNESS  ? ? ?Family History  ?Problem Relation Age of Onset  ? Breast cancer Other   ? Breast cancer Mother   ? ? ?Prior to Admission medications   ?Medication Sig Start Date End Date Taking? Authorizing Provider  ?acetaminophen (TYLENOL) 325 MG tablet Take 2 tablets (650 mg total) by mouth every 6 (six) hours as needed for mild pain (or Fever >/= 101). 12/03/20  Yes Domenic Polite, MD  ?allopurinol (ZYLOPRIM) 100 MG tablet Take 100 mg by mouth daily.    Yes [provider]  ?calcium carbonate (OS-CAL) 600 MG TABS tablet Take 600 mg by mouth daily.   Yes [provider]  ?fexofenadine (ALLEGRA) 180 MG tablet Take 180 mg by mouth daily.   Yes [provider]  ?folic acid (FOLVITE) 458 MCG tablet Take 800 mcg by mouth daily as needed (upset stomach).   Yes [provider]  ?glipiZIDE (GLUCOTROL XL) 5 MG 24 hr tablet Take 5 mg by mouth daily. 09/07/20  Yes [provider]  ?levETIRAcetam (KEPPRA) 500 MG tablet Take 500 mg by mouth 2 (two) times daily.   Yes [provider]  ?levothyroxine (SYNTHROID, LEVOTHROID) 150 MCG tablet Take 150 mcg by mouth daily before breakfast.   Yes [provider]  ?lisinopril-hydrochlorothiazide (PRINZIDE,ZESTORETIC) 20-25 MG tablet Take 1 tablet by  mouth daily.   Yes [provider]  ?mirabegron ER (MYRBETRIQ) 50 MG TB24 tablet Take 1 tablet (50 mg total) by mouth daily. 03/26/21  Yes Stoioff, Ronda Fairly, MD  ?omeprazole (PRILOSEC) 40 MG capsule Take 40 mg by mouth daily.   Yes [provider]  ?pantoprazole (PROTONIX) 40 MG tablet Take 40 mg by mouth daily.   Yes [provider]  ?sucralfate (CARAFATE) 1 g tablet Take 1 g by mouth 2 (two) times daily.   Yes [provider]  ?clobetasol (TEMOVATE) 0.05 % external solution Apply topically 2 (two) times daily. 08/19/19   [provider]  ?Fluocinolone Acetonide Scalp 0.01 % OIL Apply topically. 12/03/19   [provider]  ?fluocinonide (LIDEX) 0.05 % external solution Apply topically 2 (two) times daily as needed. 12/03/19   [provider]  ?glucose blood test strip USE 1 STRIP TO CHECK GLUCOSE TWICE DAILY 03/20/18  [provider]  ?ondansetron (ZOFRAN-ODT) 4 MG disintegrating tablet Take 4 mg by mouth every 8 (eight) hours as needed for nausea or vomiting. ?Patient not taking: Reported on 08/26/2021    [provider]  ? ? ?Physical Exam: ?Vitals:  ? 08/26/21 0800 08/26/21 0830 08/26/21 0900 08/26/21 0930  ?BP: (!) 169/71 (!) 167/77 (!) 155/97 (!) 191/88  ?Pulse: 90 87 93 86  ?Resp: '19 19 19 '$ (!) 25  ?Temp:      ?TempSrc:      ?SpO2: 99% 96% (!) 85% 96%  ?Weight:      ?Height:      ? ?Physical Exam ?Vitals and nursing note reviewed.  ?Constitutional:   ?   Appearance: Normal appearance.  ?   Comments: Oriented to person and place  ?HENT:  ?   Head: Normocephalic and atraumatic.  ?   Nose: Nose normal.  ?   Mouth/Throat:  ?   Mouth: Mucous membranes are dry.  ?   Comments: Dry mucous membranes ?Eyes:  ?   Conjunctiva/sclera: Conjunctivae normal.  ?Cardiovascular:  ?   Rate and Rhythm: Normal rate and regular rhythm.  ?Pulmonary:  ?   Effort: Pulmonary effort is normal.  ?   Breath sounds: Normal breath sounds.  ?Abdominal:  ?   General:  Abdomen is flat. Bowel sounds are normal.  ?   Palpations: Abdomen is soft.  ?Musculoskeletal:     ?   General: Normal range of motion.  ?   Cervical back: Normal range of motion and neck supple.  ?Skin: ?   General:

## 2021-08-26 NOTE — Assessment & Plan Note (Signed)
Stable ?Status post left nephrectomy ?

## 2021-08-26 NOTE — Assessment & Plan Note (Addendum)
Patient noted to have hypokalemia most likely related to HCTZ ?Supplement potassium  ?

## 2021-08-26 NOTE — Assessment & Plan Note (Addendum)
--  no need for BG checks and SSI ?

## 2021-08-26 NOTE — ED Notes (Signed)
RN to bedside to introduce self to pt. Pt resting at this time.  ?

## 2021-08-26 NOTE — ED Triage Notes (Signed)
Pt with possible fall tonight. Pt is currently alert to place and self only. Md at bedside. Pt denies pain, per ems strong ammonia odor noted on pt at home. Pt without obvious injury.  ?

## 2021-08-26 NOTE — ED Notes (Signed)
Pt placed on purewick 

## 2021-08-26 NOTE — Assessment & Plan Note (Signed)
Hold hydrochlorothiazide due to hyponatremia ?Place patient on lisinopril 20 mg daily ?

## 2021-08-26 NOTE — Assessment & Plan Note (Addendum)
Unclear etiology.  No signs of dehydration or infection. ?--MRI brain showed findings concerning for recurrence of meningioma, although unlikely to be the cause of pt's AMS.  ?Plan: ?--neuro consult today ?--EEG ?

## 2021-08-26 NOTE — Assessment & Plan Note (Addendum)
Patient was found on the floor at home and it is unclear what happened since it was unwitnessed ?--PT ?

## 2021-08-26 NOTE — Assessment & Plan Note (Addendum)
--  has not had a seizure since 2010 ?Plan: ?--Continue home Keppra ?--neuro consult today ?--EEG ?

## 2021-08-26 NOTE — ED Notes (Signed)
Son arrived and at the bedside. He advised he lives 3 hours away and sorry for his delay. He advised pt appears to be her baseline. They have had concerns over the last few months about some memory issues that might be indicative of early onset of dementia. Pt is now speaking to me and answering questions where she wouldn't talk to me this morning.  ?

## 2021-08-26 NOTE — Progress Notes (Signed)
PT Cancellation Note ? ?Patient Details ?Name: Vanessa Esparza ?MRN: [id number] ?DOB: [birth year] ? ? ?Cancelled Treatment:    Reason Eval/Treat Not Completed: Patient at procedure or test/unavailable PT orders received, chart reviewed. Attempted to see pt for PT evaluation but pt off unit for MRI with nurse reporting pt will be transferred to the floor after imaging. Will f/u as able & as pt is available. ? ?Lavone Nian, PT, DPT ?08/26/21, 2:00 PM ? ? ?[name] ?08/26/2021, 2:00 PM ?

## 2021-08-26 NOTE — ED Provider Notes (Signed)
? ?[facility] ?Provider Note ? ? ? Event Date/Time  ? First MD Initiated Contact with Patient 08/26/21 0008   ?  (approximate) ? ? ?History  ? ?Fall ? ? ?HPI ? ?Vanessa Esparza is a 81 y.o. 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  She presents tonight by EMS after a possible fall.  Details are unclear at this point but apparently her son is at home with her.  The patient may have fallen.  No seizure-like activity was witnessed.  The patient is not certain what happened or why she is here.  She is oriented to self and location but is confused about the date, the president, and with whom she is currently living (she reported it is her husband but the paramedics reported it is her son).  Unclear if she has any dementia or memory issues at baseline.  Of note, when asked how Vanessa Esparza she was, the patient told the nurse that she is 8 feet tall. ? ?She is denying any pain and shortness of breath.  She said that she feels fine.  She denies any recent urinary symptoms although she said that she sees a urologist and takes a medicine for her bladder (this was verified in her medication list). ? ?  ? ? ?Physical Exam  ? ?Triage Vital Signs: ?ED Triage Vitals [08/26/21 0012]  ?Enc Vitals Group  ?   BP (!) 157/73  ?   Pulse Rate 89  ?   Resp 18  ?   Temp 98.1 ?F (36.7 ?C)  ?   Temp Source Oral  ?   SpO2 99 %  ?   Weight 77.1 kg (170 lb)  ?   Height 1.803 m ('5\' 11"'$ )  ?   Head Circumference   ?   Peak Flow   ?   Pain Score 0  ?   Pain Loc   ?   Pain Edu?   ?   Excl. in [place]?   ? ? ?Most recent vital signs: ?Vitals:  ? 08/26/21 0330 08/26/21 0430  ?BP: (!) 146/125 (!) 146/77  ?Pulse: 94 92  ?Resp: 18 19  ?Temp:    ?SpO2: 92% 96%  ? ? ? ?General: Awake, no distress.  Oriented to self and location but confused about dates, times, and other people. ?CV:  Good peripheral perfusion.   Normal heart sounds. ?Resp:  Normal effort.  Lungs are clear to auscultation. ?Abd:  No distention.  No tenderness to palpation. ?Other:  Moving all 4 extremities with no apparent traumatic injuries. ? ? ?ED Results / Procedures / Treatments  ? ?Labs ?(all labs ordered are listed, but only abnormal results are displayed) ?Labs Reviewed  ?URINALYSIS, ROUTINE W REFLEX MICROSCOPIC - Abnormal; Notable for the following components:  ?    Result Value  ? Color, Urine YELLOW (*)   ? APPearance HAZY (*)   ? Hgb urine dipstick SMALL (*)   ? Ketones, ur 20 (*)   ? Protein, ur 100 (*)   ? All other components within normal limits  ?COMPREHENSIVE METABOLIC PANEL - Abnormal; Notable for the following components:  ? Sodium 129 (*)   ? Potassium 3.1 (*)   ? Chloride 89 (*)   ? Glucose, Bld 115 (*)   ? GFR, Estimated 60 (*)   ? All other components within normal limits  ?CBC WITH DIFFERENTIAL/PLATELET -  Abnormal; Notable for the following components:  ? Hemoglobin 11.3 (*)   ? HCT 34.3 (*)   ? MCV 73.6 (*)   ? MCH 24.2 (*)   ? Neutro Abs 8.1 (*)   ? All other components within normal limits  ?URINE CULTURE  ? ? ? ?EKG ? ?ED ECG REPORT ?IHinda Kehr, the attending physician, personally viewed and interpreted this ECG. ? ?Date: 08/26/2021 ?EKG Time: 00: 11 ?Rate: 89 ?Rhythm: normal sinus rhythm ?QRS Axis: normal ?Intervals: Left bundle branch block ?ST/T Wave abnormalities: Non-specific ST segment / T-wave changes, but no clear evidence of acute ischemia. ?Narrative Interpretation: no definitive evidence of acute ischemia; does not meet STEMI criteria.  I do not see history of left bundle branch block on prior EKGs. ? ? ? ? ?PROCEDURES: ? ?Critical Care performed: No ? ?.1-3 Lead EKG Interpretation ?Performed by: Hinda Kehr, MD ?Authorized by: Hinda Kehr, MD  ? ?  Interpretation: normal   ?  ECG rate:  87 ?  ECG rate assessment: normal   ?  Rhythm: sinus rhythm   ?  Ectopy: none   ?  Conduction: normal   ? ? ?MEDICATIONS  ORDERED IN ED: ?Medications  ?sodium chloride 0.9 % bolus 1,000 mL (1,000 mLs Intravenous New Bag/Given 08/26/21 0124)  ?potassium chloride (KLOR-CON) packet 40 mEq (40 mEq Oral Given 08/26/21 0156)  ? ? ? ?IMPRESSION / MDM / ASSESSMENT AND PLAN / ED COURSE  ?I reviewed the triage vital signs and the nursing notes. ?             ?               ? ?Differential diagnosis includes, but is not limited to, urinary tract infection, dementia, delirium, metabolic or electrolyte abnormality, fracture or other traumatic injury. ? ?The patient is denying any pain or respiratory distress at this time which is reassuring.  She is confused but unclear if this is her baseline.  She had a strong odor of urine when she was brought to the emergency department I suspect UTI. ? ?Labs ordered include CMP, CBC with differential, and urinalysis with urine culture.  The patient is on the cardiac monitor to evaluate for evidence of arrhythmia and/or significant heart rate changes.  I doubt ACS or PE. ? ? ?Clinical Course as of 08/26/21 0538  ?Sun Aug 26, 2021  ?0102 CBC is essentially normal with no leukocytosis and no significant anemia with hemoglobin of 11.3.  CMP is generally reassuring although she does have a slight degree of hyponatremia with a sodium of 129, hypochloremia with a chloride of 89, and a potassium of 3.1.  Given the probability that she is somewhat volume depleted, I will provide 1 L of normal saline IV bolus.  Urinalysis is still pending. [CF]  ?0107 Urinalysis, Routine w reflex microscopic Urine, In & Out Cath(!) ?Urinalysis shows no clear signs of infection but does have a few ketones and protein.  [CF]  ?14 Spoke with husband, Vanessa Esparza, who WAS the one at home with her.  He reports that she went to bed at her normal time (between 7-8pm).  When he went to bed around 11pm, he discovered that when she apparently went to the bathroom during the night, she apparently either fell or slid to the floor.  Husband was  unable to help her up due to his own health issues.  EMS brought her because of her confusion and unsteadiness. ? ?Husband reports that she has  not been eating or drinking well for about a week.  They have an appointment with her PCP scheduled for this coming week. ? ?Husband is comfortable with the plan for reassessment after fluids and potassium 40 mEq PO to see if she will be stable for discharge and outpatient follow up. [CF]  ?7322 Patient remains confused which husband said is not all her baseline.  Given her hyponatremia, hypokalemia, unsteadiness of gait, possible syncope, and confusion, I am consulting the hospitalist for admission. [CF]  ?0254 I counseled the hospitalist service and discussed the case by phone with Dr. Damita Dunnings.  She will admit the patient. [CF]  ?  ?Clinical Course User Index ?[CF] Hinda Kehr, MD  ? ? ? ?FINAL CLINICAL IMPRESSION(S) / ED DIAGNOSES  ? ?Final diagnoses:  ?Acute encephalopathy  ?Hyponatremia  ?Hypokalemia  ?Syncope, unspecified syncope type  ? ? ? ?Rx / DC Orders  ? ?ED Discharge Orders   ? ? None  ? ?  ? ? ? ?Note:  This document was prepared using Dragon voice recognition software and may include unintentional dictation errors. ?  ?Hinda Kehr, MD ?08/26/21 [phone] ? ?

## 2021-08-26 NOTE — Assessment & Plan Note (Signed)
Stable Continue Synthroid 

## 2021-08-27 ENCOUNTER — Encounter: Payer: Self-pay | Admitting: Internal Medicine

## 2021-08-27 DIAGNOSIS — Z20822 Contact with and (suspected) exposure to covid-19: Secondary | ICD-10-CM | POA: Diagnosis present

## 2021-08-27 DIAGNOSIS — Z79899 Other long term (current) drug therapy: Secondary | ICD-10-CM | POA: Diagnosis not present

## 2021-08-27 DIAGNOSIS — M109 Gout, unspecified: Secondary | ICD-10-CM | POA: Diagnosis present

## 2021-08-27 DIAGNOSIS — F0390 Unspecified dementia without behavioral disturbance: Secondary | ICD-10-CM | POA: Diagnosis not present

## 2021-08-27 DIAGNOSIS — T502X5A Adverse effect of carbonic-anhydrase inhibitors, benzothiadiazides and other diuretics, initial encounter: Secondary | ICD-10-CM | POA: Diagnosis present

## 2021-08-27 DIAGNOSIS — K219 Gastro-esophageal reflux disease without esophagitis: Secondary | ICD-10-CM | POA: Diagnosis present

## 2021-08-27 DIAGNOSIS — R4182 Altered mental status, unspecified: Secondary | ICD-10-CM | POA: Diagnosis present

## 2021-08-27 DIAGNOSIS — E876 Hypokalemia: Secondary | ICD-10-CM | POA: Diagnosis present

## 2021-08-27 DIAGNOSIS — E039 Hypothyroidism, unspecified: Secondary | ICD-10-CM | POA: Diagnosis present

## 2021-08-27 DIAGNOSIS — G9341 Metabolic encephalopathy: Secondary | ICD-10-CM | POA: Diagnosis not present

## 2021-08-27 DIAGNOSIS — Z96651 Presence of right artificial knee joint: Secondary | ICD-10-CM | POA: Diagnosis present

## 2021-08-27 DIAGNOSIS — Z905 Acquired absence of kidney: Secondary | ICD-10-CM | POA: Diagnosis not present

## 2021-08-27 DIAGNOSIS — Z7984 Long term (current) use of oral hypoglycemic drugs: Secondary | ICD-10-CM | POA: Diagnosis not present

## 2021-08-27 DIAGNOSIS — M069 Rheumatoid arthritis, unspecified: Secondary | ICD-10-CM | POA: Diagnosis present

## 2021-08-27 DIAGNOSIS — E871 Hypo-osmolality and hyponatremia: Secondary | ICD-10-CM | POA: Diagnosis present

## 2021-08-27 DIAGNOSIS — E785 Hyperlipidemia, unspecified: Secondary | ICD-10-CM | POA: Diagnosis present

## 2021-08-27 DIAGNOSIS — I10 Essential (primary) hypertension: Secondary | ICD-10-CM | POA: Diagnosis present

## 2021-08-27 DIAGNOSIS — Z90710 Acquired absence of both cervix and uterus: Secondary | ICD-10-CM | POA: Diagnosis not present

## 2021-08-27 DIAGNOSIS — Z86018 Personal history of other benign neoplasm: Secondary | ICD-10-CM

## 2021-08-27 DIAGNOSIS — Z86011 Personal history of benign neoplasm of the brain: Secondary | ICD-10-CM | POA: Diagnosis not present

## 2021-08-27 DIAGNOSIS — Z85528 Personal history of other malignant neoplasm of kidney: Secondary | ICD-10-CM | POA: Diagnosis not present

## 2021-08-27 DIAGNOSIS — E119 Type 2 diabetes mellitus without complications: Secondary | ICD-10-CM | POA: Diagnosis present

## 2021-08-27 DIAGNOSIS — G4089 Other seizures: Secondary | ICD-10-CM | POA: Diagnosis present

## 2021-08-27 DIAGNOSIS — Z803 Family history of malignant neoplasm of breast: Secondary | ICD-10-CM | POA: Diagnosis not present

## 2021-08-27 DIAGNOSIS — R509 Fever, unspecified: Secondary | ICD-10-CM

## 2021-08-27 LAB — GLUCOSE, CAPILLARY
Glucose-Capillary: 83 mg/dL (ref 70–99)
Glucose-Capillary: 108 mg/dL — ABNORMAL HIGH (ref 70–99)
Glucose-Capillary: 101 mg/dL — ABNORMAL HIGH (ref 70–99)

## 2021-08-27 LAB — URINE CULTURE: Culture: NO GROWTH

## 2021-08-27 LAB — RESP PANEL BY RT-PCR (FLU A&B, COVID) ARPGX2
Influenza A by PCR: NEGATIVE
Influenza B by PCR: NEGATIVE
SARS Coronavirus 2 by RT PCR: NEGATIVE

## 2021-08-27 MED ORDER — NYSTATIN 100000 UNIT/ML MT SUSP
5.0000 mL | Freq: Four times a day (QID) | OROMUCOSAL | Status: AC
  Administered 2021-08-27 – 2021-08-30 (×10): 500000 [IU] via ORAL
  Filled 2021-08-27 (×10): qty 5

## 2021-08-27 MED ORDER — ADULT MULTIVITAMIN W/MINERALS CH
1.0000 | ORAL_TABLET | Freq: Every day | ORAL | Status: DC
  Administered 2021-08-27 – 2021-08-30 (×4): 1 via ORAL
  Filled 2021-08-27 (×4): qty 1

## 2021-08-27 MED ORDER — ENSURE ENLIVE PO LIQD
237.0000 mL | Freq: Three times a day (TID) | ORAL | Status: DC
  Administered 2021-08-27 – 2021-08-30 (×6): 237 mL via ORAL

## 2021-08-27 NOTE — Assessment & Plan Note (Addendum)
--  pt previously had her tumor resected in 2010 by Dr. Kathie Dike at [facility]. ?--current MRI brain showed likely recurrence which extends to the right sigmoid sinus and may be impacting venous drainage. ?--neurosurgery consulted ?Plan: ?--outpatient f/u with neurosurgery Dr. Izora Ribas (pt does not want to go back to [facility]). ?

## 2021-08-27 NOTE — TOC Initial Note (Addendum)
Transition of Care (TOC) - Initial/Assessment Note  ? ? ?Patient Details  ?Name: Vanessa Esparza ?MRN: [id number] ?Date of Birth: 06-19-1940 ? ?Transition of Care (TOC) CM/SW Contact:    ?Shelagh Rayman A Johnson [place]ola, LCSW ?Phone Number: ?08/27/2021, 11:55 AM ? ?Clinical Narrative:  CSW spoke with pt's spouse regarding dc plan and pt's spouse does not want pt to go to SNF. Pt's spouse wants to take pt home with [facility] at d/c. Pt has used [facility] in the past and pt's spouse is agreeable to use them again. Corene Cornea with [facility] notified.               ? ?Per PT pt's Son agreeable to SNF, workup done will provide b/o once available. Home health option there in the case family does decide to take the pt home at dc. ? ?Expected Discharge Plan: [place] ?Barriers to Discharge: Continued Medical Work up ? ? ?Patient Goals and CMS Choice ?Patient states their goals for this hospitalization and ongoing recovery are:: per spoude ?  ?Choice offered to / list presented to : Spouse ? ?Expected Discharge Plan and Services ?Expected Discharge Plan: [place] ?In-house Referral: NA ?  ?Post Acute Care Choice: Home Health ?Living arrangements for the past 2 months: [place] ?                ?  ?  ?  ?  ?  ?  ?  ?  ?  ?  ? ?Prior Living Arrangements/Services ?Living arrangements for the past 2 months: [place] ?Lives with:: Spouse ?Patient language and need for interpreter reviewed:: Yes ?Do you feel safe going back to the place where you live?: Yes      ?Need for Family Participation in Patient Care: Yes (Comment) ?Care giver support system in place?: Yes (comment) ?  ?Criminal Activity/Legal Involvement Pertinent to Current Situation/Hospitalization: No - Comment as needed ? ?Activities of Daily Living ?Home Assistive Devices/Equipment: Eyeglasses ?ADL Screening (condition at time of admission) ?Patient's cognitive ability adequate to safely complete daily activities?: No ?Is the patient deaf or  have difficulty hearing?: No ?Does the patient have difficulty seeing, even when wearing glasses/contacts?: Yes ?Does the patient have difficulty concentrating, remembering, or making decisions?: Yes ?Patient able to express need for assistance with ADLs?: No ?Does the patient have difficulty dressing or bathing?: Yes ?Independently performs ADLs?: No ?Communication: Needs assistance ?Is this a change from baseline?: Change from baseline, expected to last <3 days ?Dressing (OT): Needs assistance ?Is this a change from baseline?: Change from baseline, expected to last >3 days ?Grooming: Needs assistance ?Is this a change from baseline?: Change from baseline, expected to last <3 days ?Feeding: Needs assistance ?Is this a change from baseline?: Change from baseline, expected to last >3 days ?Bathing: Needs assistance ?Is this a change from baseline?: Change from baseline, expected to last >3 days ?Toileting: Needs assistance ?Is this a change from baseline?: Change from baseline, expected to last >3days ?In/Out Bed: Needs assistance ?Is this a change from baseline?: Change from baseline, expected to last >3 days ?Walks in Home: Needs assistance ?Is this a change from baseline?: Change from baseline, expected to last >3 days ?Does the patient have difficulty walking or climbing stairs?: Yes ?Weakness of Legs: Both ?Weakness of Arms/Hands: Both ? ?Permission Sought/Granted ?Permission sought to share information with : Family Supports ?  ? Share Information with NAME: Gene ?   ? Permission granted to share  info w Relationship: spouse ?   ? ?Emotional Assessment ?Appearance:: Appears stated age ?Attitude/Demeanor/Rapport: Unable to Assess ?Affect (typically observed): Unable to Assess ?Orientation: : Oriented to Self, Oriented to Place ?Alcohol / Substance Use: Not Applicable ?Psych Involvement: No (comment) ? ?Admission diagnosis:  Hypokalemia [E87.6] ?Hyponatremia [E87.1] ?Acute encephalopathy [G93.40] ?Syncope,  unspecified syncope type [R55] ?Acute metabolic encephalopathy [O03.70] ?Patient Active Problem List  ? Diagnosis Date Noted  ? Seizure ([place])   ? Fall   ? Hyponatremia   ? Hypokalemia   ? Near syncope   ? Acute metabolic encephalopathy 48/88/9169  ? Overactive bladder 03/24/2020  ? Urge incontinence 03/24/2020  ? S/P TKR (total knee replacement) using cement, right 06/30/2018  ? 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 Personal history of renal cell carcinoma 09/30/2016  ? Anemia 08/16/2016  ? Diabetes mellitus type 2, uncomplicated ([place]) 45/07/8880  ? Hyperlipidemia, unspecified 08/16/2016  ? Hypertension 08/16/2016  ? Thyroid disease 08/16/2016  ? Acquired cyst of kidney 04/17/2012  ? Mixed urge and stress incontinence 04/17/2012  ? Renal colic 80/07/4915  ? Malignant neoplasm of kidney ([place]) 03/28/2011  ? Benign neoplasm of brain ([place]) 07/04/2008  ? Esophageal reflux 07/04/2008  ? Hypercholesterolemia 07/04/2008  ? Hypertension, benign 07/04/2008  ? Hypothyroidism (acquired) 07/04/2008  ? ?PCP:  [name], MD ?Pharmacy:   ?[id number] [address] ?[place] [address] ?Phone: [phone] Fax: [phone] ? ? ? ? ?Social Determinants of Health (SDOH) Interventions ?  ? ?Readmission Risk Interventions ?   ? View : No data to display.  ?  ?  ?  ? ? ? ?

## 2021-08-27 NOTE — Evaluation (Signed)
Occupational Therapy Evaluation ?Patient Details ?Name: Vanessa Esparza ?MRN: [id number] ?DOB: 1940/08/23 ?Today's Date: 08/27/2021 ? ? ?History of Present Illness Pt is an 81 y/o F who presented on 08/26/21 after a possible fall. Pt noted to have hypokalemia & hyponatremia & is admitted to the hospital for further evaluation. 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 PMH: seizures, HTN, DM, meningioma s/p craniotomy  ? ?Clinical Impression ?  ?Vanessa Esparza was seen for OT/PT co-evaluation this date. Prior to hospital admission, pt was Independ[employer] for mobility and I/ADLs. Pt lives with spouse and assists with all IADLs. Pt presents to acute OT demonstrating impaired ADL performance and functional mobility 2/2 decreased activity tolerance and functional strength/ROM/balance deficits. Upon arrival pt seated EOB with PT and pt's son at bedside. Pt currently requires MOD A X2 + RW doff underwear in standing. MAX A doff B socks at bed level. Pt would benefit from skilled OT to address noted impairments and functional limitations (see below for any additional details). Upon hospital discharge, recommend STR to maximize pt safety and return to PLOF. ?  ? ?Recommendations for follow up therapy are one component of a multi-disciplinary discharge planning process, led by the attending physician.  Recommendations may be updated based on patient status, additional functional criteria and insurance authorization.  ? ?Follow Up Recommendations ? Skilled nursing-short term rehab (<3 hours/day)  ?  ?Assistance Recommended at Discharge Frequent or constant Supervision/Assistance  ?Patient can return home with the following Two people to help with walking and/or transfers;Two people to help with  bathing/dressing/bathroom;Help with stairs or ramp for entrance ? ?  ?Functional Status Assessment ? Patient has had a recent decline in their functional status and demonstrates the ability to make significant improvements in function in a reasonable and predictable amount of time.  ?Equipment Recommendations ? Hospital bed;BSC/3in1  ?  ?Recommendations for Other Services   ? ? ?  ?Precautions / Restrictions Precautions ?Precautions: Fall ?Restrictions ?Weight Bearing Restrictions: No  ? ?  ? ?Mobility Bed Mobility ?Overal bed mobility: Needs Assistance ?Bed Mobility: Sit to Supine ?  ?  ?  ?Sit to supine: Min assist ?  ?  ?  ? ?Transfers ?Overall transfer level: Needs assistance ?Equipment used: Rolling walker (2 wheels) ?Transfers: Sit to/from Stand ?Sit to Stand: +2 physical assistance, Mod assist, Max assist ?  ?  ?  ?  ?  ?  ?  ? ?  ?Balance Overall balance assessment: Needs assistance ?Sitting-balance support: Feet supported, Bilateral upper extremity supported ?Sitting balance-Leahy Scale: Fair ?  ?  ?Standing balance support: Bilateral upper extremity supported ?Standing balance-Leahy Scale: Poor ?  ?  ?  ?  ?  ?  ?  ?  ?  ?  ?  ?  ?   ? ?ADL either performed or assessed with clinical judgement  ? ?ADL Overall ADL's : Needs assistance/impaired ?  ?  ?  ?  ?  ?  ?  ?  ?  ?  ?  ?  ?  ?  ?  ?  ?  ?  ?  ?General ADL Comments: MOD A X2 + RW doff underwear in standing. MAX A doff B socks at bed level  ? ? ? ? ?Pertinent Vitals/Pain Pain Assessment ?Pain Assessment: Faces ?Faces Pain Scale: No hurt  ? ? ? ?  Hand Dominance   ?  ?Extremity/Trunk Assessment Upper Extremity Assessment ?Upper Extremity Assessment: Generalized weakness ?  ?Lower Extremity Assessment ?Lower Extremity Assessment: Generalized weakness;Difficult to assess due to impaired cognition (3-/5 knee extension in sitting) ?  ?  ?  ?Communication Communication ?Communication: No difficulties ?  ?Cognition Arousal/Alertness: Awake/alert ?Behavior During  Therapy: [facility] for tasks assessed/performed ?Overall Cognitive Status: Impaired/Different from baseline ?Area of Impairment: Orientation, Memory, Attention, Following commands, Awareness, Problem solving, [place] level ?  ?  ?  ?  ?  ?  ?  ?  ?Orientation Level: Disoriented to, Situation ?  ?Memory: Decreased recall of precautions, Decreased short-term memory ?Following Commands: Follows one step commands consistently, Follows one step commands with increased time ?  ?Awareness: Emergent, Anticipatory ?Problem Solving: Difficulty sequencing, Requires verbal cues, Slow processing, Decreased initiation ?  ?  ?  ?General Comments  Pt with stress incontinence upon standing, reports she does wear a pad at home. ? ?  ?Exercises   ?  ?Shoulder Instructions    ? ? ?Home Living Family/patient expects to be discharged to:: Private residence ?Living Arrangements: Spouse/significant other ?Available Help at Discharge: Family;Available PRN/intermittently ?Type of Home: House ?Home Access: Ramped entrance (has portable ramp for entry) ?  ?  ?Home Layout: One level ?  ?  ?  ?  ?  ?  ?  ?Home Equipment: Conservation officer, nature (2 wheels) ?  ?  ?  ? ?  ?Prior Functioning/Environment Prior Level of Function : Independent/Modified Independent;Driving;History of Falls (last six months) ?  ?  ?  ?  ?  ?  ?Mobility Comments: Pt lives with husband & pt performs all driving, cooking, cleaning for her & huband. Pt does have hx of 1 fall/month for the past 4 months. Pt & son reports pt has difficulty picking up one foot (doesn't remember which) & has fallen. PRN use of RW. ?  ?  ? ?  ?  ?OT Problem List: Decreased strength;Decreased range of motion;Decreased activity tolerance;Impaired balance (sitting and/or standing);Decreased safety awareness ?  ?   ?OT Treatment/Interventions: Self-care/ADL training;Therapeutic exercise;Energy conservation;DME and/or AE instruction;Therapeutic activities;Patient/family education;Balance training  ?  ?OT  Goals(Current goals can be found in the care plan section) Acute Rehab OT Goals ?Patient Stated Goal: to go home ?OT Goal Formulation: With patient/family ?Time For Goal Achievement: 09/10/21 ?Potential to Achieve Goals: Good ?ADL Goals ?Pt Will Perform Grooming: with supervision;sitting ?Pt Will Perform Lower Body Dressing: with min assist;sit to/from stand ?Pt Will Transfer to Toilet: with min assist;ambulating;bedside commode ?Pt Will Perform Toileting - Clothing Manipulation and hygiene: with min guard assist;sitting/lateral leans  ?OT Frequency: Min 3X/week ?  ? ?Co-evaluation PT/OT/SLP Co-Evaluation/Treatment: Yes ?Reason for Co-Treatment: For patient/therapist safety;Complexity of the patient's impairments (multi-system involvement);Necessary to address cognition/behavior during functional activity ?PT goals addressed during session: Mobility/safety with mobility;Balance;Proper use of DME ?OT goals addressed during session: ADL's and self-care ?  ? ?  ?AM-PAC OT "6 Clicks" Daily Activity     ?Outcome Measure Help from another person eating meals?: A Little ?Help from another person taking care of personal grooming?: A Lot ?Help from another person toileting, which includes using toliet, bedpan, or urinal?: A Lot ?Help from another person bathing (including washing, rinsing, drying)?: A Lot ?Help from another person to put on and taking off regular upper body clothing?: A Little ?Help from another person to put on and taking off regular lower body clothing?: A Lot ?6 Click Score: 14 ?  ?End of Session  Equipment Utilized During Treatment: Rolling walker (2 wheels) ?Nurse Communication: Mobility status ? ?Activity Tolerance: Patient tolerated treatment well ?Patient left: in bed;with call bell/phone within reach;with bed alarm set ? ?OT Visit Diagnosis: Other abnormalities of gait and mobility (R26.89);Muscle weakness (generalized) (M62.81)  ?              ?Time: 2233-6122 ?OT Time Calculation (min): 13  min ?Charges:  OT General Charges ?$OT Visit: 1 Visit ?OT Evaluation ?$OT Eval Moderate Complexity: 1 Mod ? ?Dessie Coma, M.S. OTR/L  ?08/27/21, 1:48 PM  ?ascom [phone] ? ?

## 2021-08-27 NOTE — [place] FL2 (Signed)
?[place] MEDICAID FL2 LEVEL OF CARE SCREENING TOOL  ?  ? ?IDENTIFICATION  ?Patient Name: ?Vanessa Esparza Birthdate: [birth year] Sex: female Admission Date (Current Location): ?08/26/2021  ?[place] and [place] Number: ? [place] ?  Facility and Address:  ?[facility], [address] [place], [place] [address] ?     Provider Number: ?[id number]  ?Attending Physician Name and Address:  ?Enzo Bi, MD ? Relative Name and Phone Number:  ?  ?   ?Current Level of Care: ?SNF Recommended Level of Care: ?[place] Prior Approval Number: ?  ? ?Date Approved/Denied: ?  PASRR Number: ?[id number] A ? ?Discharge Plan: ?SNF ?  ? ?Current Diagnoses: ?Patient Active Problem List  ? Diagnosis Date Noted  ? Seizure ([place])   ? Fall   ? Hyponatremia   ? Hypokalemia   ? Near syncope   ? Acute metabolic encephalopathy 19/41/7408  ? Overactive bladder 03/24/2020  ? Urge incontinence 03/24/2020  ? S/P TKR (total knee replacement) using cement, right 06/30/2018  ? 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 Personal history of renal cell carcinoma 09/30/2016  ? Anemia 08/16/2016  ? Diabetes mellitus type 2, uncomplicated ([place]) 14/48/1856  ? Hyperlipidemia, unspecified 08/16/2016  ? Hypertension 08/16/2016  ? Thyroid disease 08/16/2016  ? Acquired cyst of kidney 04/17/2012  ? Mixed urge and stress incontinence 04/17/2012  ? Renal colic 31/49/7026  ? Malignant neoplasm of kidney ([place]) 03/28/2011  ? Benign neoplasm of brain ([place]) 07/04/2008  ? Esophageal reflux 07/04/2008  ? Hypercholesterolemia 07/04/2008  ? Hypertension, benign 07/04/2008  ? Hypothyroidism (acquired) 07/04/2008  ? ? ?Orientation RESPIRATION BLADDER Height & Weight   ?  ?Self, Place ? Normal Incontinent Weight: 170 lb (77.1 kg) ?Height:  '5\' 11"'$  (180.3 cm)  ?BEHAVIORAL SYMPTOMS/MOOD NEUROLOGICAL BOWEL NUTRITION STATUS  ?    Continent Diet (regular diet)  ?AMBULATORY STATUS COMMUNICATION OF NEEDS  Skin   ?Extensive Assist Verbally Normal ?  ?  ?  ?    ?     ?     ? ? ?Personal Care Assistance Level of Assistance  ?Bathing, Feeding, Dressing Bathing Assistance: Maximum assistance ?Feeding assistance: Independent ?Dressing Assistance: Maximum assistance ?   ? ?Functional Limitations Info  ?Sight, Hearing, Speech Sight Info: Adequate ?Hearing Info: Adequate ?Speech Info: Adequate  ? ? ?SPECIAL CARE FACTORS FREQUENCY  ?PT (By licensed PT), OT (By licensed OT)   ?  ?PT Frequency: 5x ?OT Frequency: 5x ?  ?  ?  ?   ? ? ?Contractures Contractures Info: Not present  ? ? ?Additional Factors Info  ?Code Status, Allergies Code Status Info: full code ?Allergies Info: Arava (Leflunomide), Methotrexate Derivatives, Statins ?  ?  ?  ?   ? ?Current Medications (08/27/2021):  This is the current hospital active medication list ?Current Facility-Administered Medications  ?Medication Dose Route Frequency Provider Last Rate Last Admin  ? acetaminophen (TYLENOL) tablet 650 mg  650 mg Oral Q6H PRN Agbata, Tochukwu, MD      ? allopurinol (ZYLOPRIM) tablet 100 mg  100 mg Oral Daily Agbata, Tochukwu, MD   100 mg at 08/27/21 1001  ? calcium carbonate (OS-CAL - dosed in mg of elemental calcium) tablet 625 mg  625 mg Oral Q breakfast Agbata, Tochukwu, MD   625 mg at 08/27/21 1006  ? enoxaparin (LOVENOX) injection 40 mg  40 mg Subcutaneous Daily Agbata, Tochukwu, MD   40 mg at 08/27/21 1002  ? folic acid (FOLVITE) tablet  1 mg  1,000 mcg Oral Daily Agbata, Tochukwu, MD   1 mg at 08/27/21 1001  ? levETIRAcetam (KEPPRA) IVPB 500 mg/100 mL premix  500 mg Intravenous Q12H Foust, Katy L, NP 400 mL/hr at 08/27/21 0815 500 mg at 08/27/21 0815  ? levothyroxine (SYNTHROID) tablet 150 mcg  150 mcg Oral QAC breakfast Agbata, Tochukwu, MD      ? lisinopril (ZESTRIL) tablet 20 mg  20 mg Oral Daily Agbata, Tochukwu, MD   20 mg at 08/27/21 1000  ? loratadine (CLARITIN) tablet 10 mg  10 mg Oral Daily Agbata, Tochukwu, MD   10 mg at 08/27/21 1001  ?  mirabegron ER (MYRBETRIQ) tablet 50 mg  50 mg Oral Daily Agbata, Tochukwu, MD   50 mg at 08/27/21 1007  ? ondansetron (ZOFRAN) tablet 4 mg  4 mg Oral Q6H PRN Agbata, Tochukwu, MD      ? Or  ? ondansetron (ZOFRAN) injection 4 mg  4 mg Intravenous Q6H PRN Agbata, Tochukwu, MD      ? pantoprazole (PROTONIX) injection 40 mg  40 mg Intravenous Q12H Foust, Katy L, NP   40 mg at 08/27/21 1001  ? sucralfate (CARAFATE) tablet 1 g  1 g Oral BID AC Agbata, Tochukwu, MD   1 g at 08/27/21 1001  ? ? ? ?Discharge Medications: ?Please see discharge summary for a list of discharge medications. ? ?Relevant Imaging Results: ? ?Relevant Lab Results: ? ? ?Additional Information ?WNI:[id number] ? ?Berea Majkowski A Dalan Cowger, LCSW ? ? ? ? ?

## 2021-08-27 NOTE — Progress Notes (Signed)
?  Progress Note ? ? ?Patient: Vanessa Esparza GMW:[id number] DOB: February 05, 1941 DOA: 08/26/2021     0 ?DOS: the patient was seen and examined on 08/27/2021 ?  ?Brief hospital course: ?No notes on file ? ?Assessment and Plan: ?* Acute metabolic encephalopathy ?Unclear etiology.  No signs of dehydration or infection. ?--MRI brain showed findings concerning for recurrence of meningioma, although unlikely to be the cause of pt's AMS.  ?Plan: ?--neurosurgery consult ? ?Hypokalemia ?Patient noted to have hypokalemia most likely related to HCTZ ?Supplement potassium  ? ?Hyponatremia ?Appears chronic ? ?Diabetes mellitus type 2, uncomplicated (HCC) ?--no need for BG checks and SSI ? ?Hypertension ?Hold hydrochlorothiazide due to hyponatremia ?Place patient on lisinopril 20 mg daily ? ?Seizure ([place]) ?Stable ?Continue Keppra ?Place patient on seizure precautions ? ?Personal history of renal cell carcinoma ?Stable ?Status post left nephrectomy ? ?Mild fever ?--temp 100.7 last night ?--urine cx neg, CXR neg ?Plan: ?--check covid ?--monitor without abx ? ?History of meningioma ?--had prior resection 15-20 years ago ?--current MRI brain showed likely recurrence which extends to the right sigmoid sinus and may be impacting venous drainage. ?Plan: ?--neurosurgery consult ? ?Fall ?Patient was found on the floor at home and it is unclear what happened since it was unwitnessed ?--PT ? ?Hypothyroidism (acquired) ?Stable ?Continue Synthroid ? ? ? ? ?  ? ?Subjective:  ?Pt was oriented today, but slow to respond to questions.  Denied dysuria. ? ? ?Physical Exam: ? ?Constitutional: NAD, AAOx3 ?HEENT: conjunctivae and lids normal, EOMI ?CV: No cyanosis.   ?RESP: normal respiratory effort, on RA ?Extremities: No effusions, edema in BLE ?SKIN: warm, dry ?Neuro: II - XII grossly intact.   ?Psych: flat mood and affect.   ? ? ?Data Reviewed: ? ?Family Communication: husband and son updated on the phone today ? ?Disposition: ?Status is: Inpatient ? ?  Planned Discharge Destination: Home  family declined SNF rehab ? ? ? ?Time spent: 50 minutes ? ?Author: ?Enzo Bi, MD ?08/27/2021 8:05 PM ? ?For on call review [email or web address].  ?

## 2021-08-27 NOTE — Progress Notes (Signed)
Initial Nutrition Assessment ? ?DOCUMENTATION CODES:  ? ?Not applicable ? ?INTERVENTION:  ? ?-Ensure Enlive po TID, each supplement provides 350 kcal and 20 grams of protein ?-MVI with minerals daily ? ?NUTRITION DIAGNOSIS:  ? ?Inadequate oral intake related to decreased appetite as evidenced by meal completion < 50%, per patient/family report. ? ?GOAL:  ? ?Patient will meet greater than or equal to 90% of their needs ? ?MONITOR:  ? ?PO intake, Supplement acceptance, Labs, Weight trends, Skin, I & O's ? ?REASON FOR ASSESSMENT:  ? ?Malnutrition Screening Tool ?  ? ?ASSESSMENT:  ? ?Vanessa Esparza is a 81 y.o. female with medical history significant for hypertension, hypothyroidism, meningioma, GERD, history of renal cell carcinoma status post left partial nephrectomy, diabetes mellitus who was brought into the ER by EMS for evaluation after she was found on the floor at home by her husband. ? ?Pt admitted with acute metabolic encephalopathy.  ? ?4/17- s/p BSE- advanced to regular diet with thin liquids  ? ?Reviewed I/O's: +356 ml x 24 hours  ? ?Pt sitting up in bed at time of visit. She reports he had a good appetite at home, but only consumed a few bites of lunch. Pt states "I eat when it's food I like". RD asked pt what she liked to eat, but pt struggled to answer this, but stated she liked eating vegetables. She denies any difficulty tolerating current diet texture. Noted pt drinking more liquids than food at this time.  ? ?Reviewed wt hx; wt has been stable over the past 6 months.  ? ?RD discussed importance of good meal and supplement intake to promote healing.  ? ?Medications reviewed and include calcium carbonate, folic acid, and keppra.  ? ?No results found for: HGBA1C PTA DM medications are none.  ? ?Labs reviewed: Na: 129, K: 3.1, Mg: 1.6, CBGS: 83-115 (inpatient orders for glycemic control are none).   ? ?NUTRITION - FOCUSED PHYSICAL EXAM: ? ?Flowsheet Row Most Recent Value  ?Orbital Region No depletion   ?Upper Arm Region No depletion  ?Thoracic and Lumbar Region No depletion  ?Buccal Region No depletion  ?Temple Region No depletion  ?Clavicle Bone Region No depletion  ?Clavicle and Acromion Bone Region No depletion  ?Scapular Bone Region No depletion  ?Dorsal Hand No depletion  ?Patellar Region No depletion  ?Anterior Thigh Region No depletion  ?Posterior Calf Region No depletion  ?Edema (RD Assessment) None  ?Hair Reviewed  ?Eyes Reviewed  ?Mouth Reviewed  ?Skin Reviewed  ?Nails Reviewed  ? ?  ? ? ?Diet Order:   ?Diet Order   ? ?       ?  Diet regular Room service appropriate? Yes; Fluid consistency: Thin  Diet effective now       ?  ? ?  ?  ? ?  ? ? ?EDUCATION NEEDS:  ? ?Education needs have been addressed ? ?Skin:  Skin Assessment: Reviewed RN Assessment ? ?Last BM:  Unknown ? ?Height:  ? ?Ht Readings from Last 1 Encounters:  ?08/26/21 '5\' 11"'$  (1.803 m)  ? ? ?Weight:  ? ?Wt Readings from Last 1 Encounters:  ?08/26/21 77.1 kg  ? ? ?Ideal Body Weight:  70.5 kg ? ?BMI:  Body mass index is 23.71 kg/m?. ? ?Estimated Nutritional Needs:  ? ?Kcal:  1700-1900 ? ?Protein:  90-105 grams ? ?Fluid:  > 1.7 L ? ? ? ?Loistine Chance, RD, LDN, CDCES ?Registered Dietitian II ?Certified Diabetes Care and Education Specialist ?Please refer to [facility] for RD  and/or RD on-call/weekend/after hours pager  ?

## 2021-08-27 NOTE — Evaluation (Signed)
Clinical/Bedside Swallow Evaluation ?Patient Details  ?Name: Vanessa Esparza ?MRN: [id number] ?Date of Birth: January 18, 1941 ? ?Today's Date: 08/27/2021 ?Time: SLP Start Time (ACUTE ONLY): 0820 SLP Stop Time (ACUTE ONLY): 0900 ?SLP Time Calculation (min) (ACUTE ONLY): 40 min ? ?Past Medical History:  ?Past Medical History:  ?Diagnosis Date  ? Anemia   ? Arthritis   ? Collagen vascular disease ([place])   ? Rhematoid Arthritis  ? Diabetes mellitus without complication ([place])   ? GERD (gastroesophageal reflux disease)   ? Gout   ? Hyperlipidemia   ? Hypertension   ? Hypothyroidism   ? Meningioma ([place])   ? Psoriasis   ? Renal cell carcinoma, left ([place]) 05/2011  ? Left Partial Nephrectomy  ? Seizure ([place])   ? Seizures ([place])   ? LAST SEIZURE ON 02-08-09  ? ?Past Surgical History:  ?Past Surgical History:  ?Procedure Laterality Date  ? ABDOMINAL HYSTERECTOMY    ? APPENDECTOMY    ? BACK SURGERY    ? LUMBAR  ? BRAIN MENINGIOMA EXCISION    ? BRAIN SURGERY    ? CRANIOTOMY WITH EXCISION OF SUPRATENTORIAL MENINGIOMA  ? COLONOSCOPY    ? COLONOSCOPY WITH PROPOFOL N/A 01/28/2017  ? Procedure: COLONOSCOPY WITH PROPOFOL;  Surgeon: Lollie Sails, MD;  Location: [facility] ENDOSCOPY;  Service: Endoscopy;  Laterality: N/A;  ? COLONOSCOPY WITH PROPOFOL N/A 01/01/2021  ? Procedure: COLONOSCOPY WITH PROPOFOL;  Surgeon: Annamaria Helling, DO;  Location: [facility] ENDOSCOPY;  Service: Gastroenterology;  Laterality: N/A;  ? CRANIOTOMY    ? w/ excision supratentorial meningioma  ? ESOPHAGOGASTRODUODENOSCOPY    ? ESOPHAGOGASTRODUODENOSCOPY (EGD) WITH PROPOFOL N/A 01/28/2017  ? Procedure: ESOPHAGOGASTRODUODENOSCOPY (EGD) WITH PROPOFOL;  Surgeon: Lollie Sails, MD;  Location: [facility] ENDOSCOPY;  Service: Endoscopy;  Laterality: N/A;  ? ESOPHAGOGASTRODUODENOSCOPY (EGD) WITH PROPOFOL N/A 01/01/2021  ? Procedure: ESOPHAGOGASTRODUODENOSCOPY (EGD) WITH PROPOFOL;  Surgeon: Annamaria Helling, DO;  Location: [facility] ENDOSCOPY;  Service: Gastroenterology;   Laterality: N/A;  ? HEMORRHOID SURGERY    ? KNEE ARTHROSCOPY Right 09/21/2015  ? Procedure: ARTHROSCOPY KNEE, LATERAL AND MEDIAL MENISECTOMY;  Surgeon: Hessie Knows, MD;  Location: [facility] ORS;  Service: Orthopedics;  Laterality: Right;  ? LAPAROSCOPIC BILATERAL SALPINGO OOPHERECTOMY Bilateral 04/12/2019  ? Procedure: LAPAROSCOPIC BILATERAL SALPINGO OOPHORECTOMY;  Surgeon: Ward, Honor Loh, MD;  Location: [facility] ORS;  Service: Gynecology;  Laterality: Bilateral;  ? PARTIAL NEPHRECTOMY Left   ? DUE TO RENAL CELL CARCINOMA  ? REDUCTION MAMMAPLASTY Bilateral   ? THYROID SURGERY    ? TOTAL KNEE ARTHROPLASTY Right 06/30/2018  ? Procedure: TOTAL KNEE ARTHROPLASTY-RIGHT;  Surgeon: Hessie Knows, MD;  Location: [facility] ORS;  Service: Orthopedics;  Laterality: Right;  ? ?HPI:  ?Pt  is a 81 y.o. female with medical history significant for hypertension, hypothyroidism, meningioma, GERD, history of renal cell carcinoma status post left partial nephrectomy, diabetes mellitus who was brought into the ER by EMS for evaluation after she was found on the floor at home by her husband.  Details unclear re: a fall.  She exhibited confusion in the ED per report.  He states that patient has not felt well for the last several days and has had nausea without any vomiting.  She has had poor oral intake and appears to be weak.  At baseline she does not use an assist device for ambulation.  He states that she had a similar episode about 8 months ago and at that time was admitted to the hospital with altered mental status and treated for pneumonia  with improvement in her symptoms.  Admitting dxs include: hypokalemia as well as hyponatremia.   MRI: No acute intracranial abnormality or evidence for acute trauma  2. Progressive intra and extracranial soft tissue at the operative  site, likely representing recurrent meningioma.  CXR negative.  ?  ?Assessment / Plan / Recommendation  ?Clinical Impression ? Pt seen for BSE this morning. Son present. Pt was  verbal and engaged appropriately w/ this Clinician but needed min+ verbal cues for follow through w/ instructions/tasks. She was able to help sit herself upright in bed for BSE given cues/support. Pt appeared to respond best when distractions were reduced and given 1-step commands.  ? ?Pt appears to present w/ adequate oropharyngeal phase swallow function w/ No oropharyngeal phase dysphagia noted, No neuromuscular deficits noted. Pt consumed po trials w/ No overt, clinical s/s of aspiration during po trials. Pt appears at reduced risk for aspiration following general aspiration precautions and when given setup support.  ? ?During po trials, pt consumed all consistencies including thin liquids via Cup w/ no overt coughing, decline in vocal quality, or change in respiratory presentation during/post trials. Oral phase appeared [facility] w/ timely bolus management, mastication, and control of bolus propulsion for A-P transfer for swallowing. Oral clearing achieved w/ all trial consistencies. OM Exam appeared [facility] w/ no unilateral weakness noted. Noted dried, sticky debris on tongue; min white coating -- MD made aware w/ request to further assess and for oral rinse to tx. Speech Clear. Pt fed self w/ setup support.  ? ?Recommend a Regular consistency diet w/ well-Cut meats, moistened foods; Thin liquids VIA CUP for best support and awareness when drinking liquids. Recommend general aspiration precautions, GERD precautions baseline. Pills w/ liquid vs WHOLE in Puree if needed for safer, easier swallowing. Education given on Pills in Puree; food consistencies and easy to eat options; general aspiration precautions. NSG to reconsult if any new needs arise. NSG agreed. MD updated.  ?Menu present for pt/Son. ?SLP Visit Diagnosis: Dysphagia, unspecified (R13.10) ?   ?Aspiration Risk ? No limitations  ?  ?Diet Recommendation   Regular consistency diet w/ well-Cut meats, moistened foods; Thin liquids VIA CUP for best support and  awareness when drinking liquids. Recommend general aspiration precautions, GERD precautions baseline.  ? ?Medication Administration: Whole meds with liquid (but whole in a Puree if needed for ease of swallowing)  ?  ?Other  Recommendations Recommended Consults:  (Dietician support if needed) ?Oral Care Recommendations: Oral care BID;Oral care before and after PO;Patient independent with oral care (setup) ?Other Recommendations:  (n/a)   ? ?Recommendations for follow up therapy are one component of a multi-disciplinary discharge planning process, led by the attending physician.  Recommendations may be updated based on patient status, additional functional criteria and insurance authorization. ? ?Follow up Recommendations No SLP follow up  ? ? ?  ?Assistance Recommended at Discharge Set up Supervision/Assistance (generalized weakness)  ?Functional Status Assessment Patient has had a recent decline in their functional status and demonstrates the ability to make significant improvements in function in a reasonable and predictable amount of time.  ?Frequency and Duration  (n/a)  ? (n/a) ?  ?   ? ?Prognosis Prognosis for Safe Diet Advancement: Good ?Barriers to Reach Goals:  (mild confusion at admit)  ? ?  ? ?Swallow Study   ?General Date of Onset: 08/26/21 ?HPI: Pt  is a 81 y.o. female with medical history significant for hypertension, hypothyroidism, meningioma, GERD, history of renal cell carcinoma status post left  partial nephrectomy, diabetes mellitus who was brought into the ER by EMS for evaluation after she was found on the floor at home by her husband.  Details unclear re: a fall.  She exhibited confusion in the ED per report.  He states that patient has not felt well for the last several days and has had nausea without any vomiting.  She has had poor oral intake and appears to be weak.  At baseline she does not use an assist device for ambulation.  He states that she had a similar episode about 8 months ago and  at that time was admitted to the hospital with altered mental status and treated for pneumonia with improvement in her symptoms.  Admitting dxs include: hypokalemia as well as hyponatremia.   MRI: No acute intracrania

## 2021-08-27 NOTE — Assessment & Plan Note (Addendum)
--  temp 100.7 last night ?--urine cx neg, CXR neg, covid neg ?Plan: ?--monitor without abx ?

## 2021-08-27 NOTE — Evaluation (Signed)
Physical Therapy Evaluation ?Patient Details ?Name: Vanessa Esparza ?MRN: [id number] ?DOB: December 16, 1940 ?Today's Date: 08/27/2021 ? ?History of Present Illness ? Pt is an 81 y/o F who presented on 08/26/21 after a possible fall. Pt noted to have hypokalemia & hyponatremia & is admitted to the hospital for further evaluation. 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 PMH: seizures, HTN, DM, meningioma s/p craniotomy  ?Clinical Impression ? Pt seen for PT evaluation with son present for session. Prior to admission pt was independent with all mobility, cooking, cleaning & driving, but with 1 fall/month for the last 4 months. On this date, pt is AxOx3 and is able to follow simple commands throughout session. Pt requires min assist for bed mobility with use of bed rails, but requires max assist +2 for successful STS attempt with RW following multiple failed attempts with +1 assist. Pt also demonstrates decreased awareness & ability to shift weight anteriorly to increase ease of transfers. Pt is able to complete side stepping to L at EOB with RW with max assist +2. Pt would benefit from STR upon d/c to maximize independence with functional mobility, reduce fall risk, & facilitate return to PLOF prior to return home & pt & son made aware.   ?   ? ?Recommendations for follow up therapy are one component of a multi-disciplinary discharge planning process, led by the attending physician.  Recommendations may be updated based on patient status, additional functional criteria and insurance authorization. ? ?Follow Up Recommendations Skilled nursing-short term rehab (<3 hours/day) ? ?  ?Assistance Recommended at Discharge Frequent or constant Supervision/Assistance  ?Patient can return home with the following ? Two people  to help with walking and/or transfers;Two people to help with bathing/dressing/bathroom;Direct supervision/assist for medications management;Assist for transportation;Help with stairs or ramp for entrance;Assistance with cooking/housework;Direct supervision/assist for financial management ? ?  ?Equipment Recommendations Rolling walker (2 wheels);BSC/3in1  ?Recommendations for Other Services ?    ?  ?Functional Status Assessment Patient has had a recent decline in their functional status and demonstrates the ability to make significant improvements in function in a reasonable and predictable amount of time.  ? ?  ?Precautions / Restrictions Precautions ?Precautions: Fall ?Restrictions ?Weight Bearing Restrictions: No  ? ?  ? ?Mobility ? Bed Mobility ?Overal bed mobility: Needs Assistance ?Bed Mobility: Supine to Sit, Sit to Supine ?  ?  ?Supine to sit: Min assist, HOB elevated (use of bed rails) ?Sit to supine: Min assist, HOB elevated ?  ?General bed mobility comments: cuing/min assist to straighten up in center of bed ?  ? ?Transfers ?Overall transfer level: Needs assistance ?Equipment used: Rolling walker (2 wheels) ?Transfers: Sit to/from Stand ?Sit to Stand: Max assist, +2 physical assistance, +2 safety/equipment, From elevated surface ?  ?  ?  ?  ?  ?General transfer comment: Max cuing for hand placement to push to standing but poor return demo, pt requires max assist +2 for STS from elevated EOB ?  ? ?Ambulation/Gait ?  ?  ?  ?  ?  ?  ?  ?General Gait Details: Pt is able to take a few side steps to L at EOB with max assist +2 with RW & assistance for weight shifting, balance, & cuing for technique. ? ?Stairs ?  ?  ?  ?  ?  ? ?Wheelchair  Mobility ?  ? ?Modified Rankin (Stroke Patients Only) ?  ? ?  ? ?Balance Overall balance assessment: Needs assistance ?Sitting-balance support: Feet supported, Bilateral upper extremity supported ?Sitting balance-Leahy Scale: Fair ?Sitting balance - Comments: supervision static  sitting EOB ?  ?Standing balance support: Bilateral upper extremity supported, During functional activity ?Standing balance-Leahy Scale: Zero ?Standing balance comment: R lateral lean ?  ?  ?  ?  ?  ?  ?  ?  ?  ?  ?  ?   ? ? ? ?Pertinent Vitals/Pain Pain Assessment ?Pain Assessment: Faces ?Faces Pain Scale: No hurt  ? ? ?Home Living Family/patient expects to be discharged to:: Private residence ?Living Arrangements: Spouse/significant other ?Available Help at Discharge: Family;Available PRN/intermittently ?Type of Home: House ?Home Access: Ramped entrance (has portable ramp for entry) ?  ?  ?  ?Home Layout: One level ?Home Equipment: Conservation officer, nature (2 wheels) ?   ?  ?Prior Function Prior Level of Function : Independent/Modified Independent;Driving;History of Falls (last six months) ?  ?  ?  ?  ?  ?  ?Mobility Comments: Pt lives with husband & pt performs all driving, cooking, cleaning for her & huband. Pt does have hx of 1 fall/month for the past 4 months. Pt & son reports pt has difficulty picking up one foot (doesn't remember which) & has fallen. PRN use of RW. ?  ?  ? ? ?Hand Dominance  ?   ? ?  ?Extremity/Trunk Assessment  ? Upper Extremity Assessment ?Upper Extremity Assessment: Generalized weakness ?  ? ?Lower Extremity Assessment ?Lower Extremity Assessment: Generalized weakness;Difficult to assess due to impaired cognition (3-/5 knee extension in sitting) ?  ? ?   ?Communication  ? Communication: No difficulties  ?Cognition Arousal/Alertness: Awake/alert ?Behavior During Therapy: [facility] for tasks assessed/performed ?Overall Cognitive Status: Impaired/Different from baseline ?Area of Impairment: Orientation, Memory, Attention, Following commands, Awareness, Problem solving, Rancho level ?  ?  ?  ?  ?  ?  ?  ?  ?Orientation Level: Disoriented to, Situation ?  ?Memory: Decreased recall of precautions, Decreased short-term memory ?Following Commands: Follows one step commands consistently, Follows one step commands  with increased time ?  ?Awareness: Emergent, Anticipatory ?Problem Solving: Difficulty sequencing, Requires verbal cues, Slow processing, Decreased initiation ?  ?  ?  ? ?  ?General Comments General comments (skin integrity, edema, etc.): Pt with stress incontinence upon standing, reports she does wear a pad at home. ? ?  ?Exercises    ? ?Assessment/Plan  ?  ?PT Assessment Patient needs continued PT services  ?PT Problem List Decreased strength;Decreased mobility;Decreased safety awareness;Decreased activity tolerance;Decreased balance;Decreased knowledge of use of DME;Decreased cognition;Cardiopulmonary status limiting activity;Decreased knowledge of precautions;Decreased coordination ? ?   ?  ?PT Treatment Interventions DME instruction;Gait training;Therapeutic exercise;Stair training;Balance training;Functional mobility training;Therapeutic activities;Cognitive remediation;Neuromuscular re-education;Patient/family education   ? ?PT Goals (Current goals can be found in the Care Plan section)  ?Acute Rehab PT Goals ?Patient Stated Goal: get better ?PT Goal Formulation: With patient ?Time For Goal Achievement: 09/10/21 ?Potential to Achieve Goals: Fair ? ?  ?Frequency Min 2X/week ?  ? ? ?Co-evaluation PT/OT/SLP Co-Evaluation/Treatment: Yes ?Reason for Co-Treatment: For patient/therapist safety;Complexity of the patient's impairments (multi-system involvement);Necessary to address cognition/behavior during functional activity ?PT goals addressed during session: Mobility/safety with mobility;Balance;Proper use of DME ?OT goals addressed during session: ADL's and self-care ?  ? ? ?  ?AM-PAC PT "6 Clicks" Mobility  ?Outcome Measure Help needed turning from your back to your side  while in a flat bed without using bedrails?: A Little ?Help needed moving from lying on your back to sitting on the side of a flat bed without using bedrails?: A Lot ?Help needed moving to and from a bed to a chair (including a wheelchair)?:  Total ?Help needed standing up from a chair using your arms (e.g., wheelchair or bedside chair)?: Total ?Help needed to walk in hospital room?: Total ?Help needed climbing 3-5 steps with a railing? :

## 2021-08-28 DIAGNOSIS — R4182 Altered mental status, unspecified: Secondary | ICD-10-CM

## 2021-08-28 DIAGNOSIS — R531 Weakness: Secondary | ICD-10-CM

## 2021-08-28 DIAGNOSIS — F0390 Unspecified dementia without behavioral disturbance: Secondary | ICD-10-CM | POA: Diagnosis not present

## 2021-08-28 DIAGNOSIS — G9341 Metabolic encephalopathy: Secondary | ICD-10-CM | POA: Diagnosis not present

## 2021-08-28 LAB — MAGNESIUM: Magnesium: 1.5 mg/dL — ABNORMAL LOW (ref 1.7–2.4)

## 2021-08-28 LAB — BASIC METABOLIC PANEL
Anion gap: 8 (ref 5–15)
BUN: 15 mg/dL (ref 8–23)
CO2: 29 mmol/L (ref 22–32)
Calcium: 8.5 mg/dL — ABNORMAL LOW (ref 8.9–10.3)
Chloride: 94 mmol/L — ABNORMAL LOW (ref 98–111)
Creatinine, Ser: 0.61 mg/dL (ref 0.44–1.00)
GFR, Estimated: >60 mL/min (ref >60)
Glucose, Bld: 109 mg/dL — ABNORMAL HIGH (ref 70–99)
Potassium: 3.4 mmol/L — ABNORMAL LOW (ref 3.5–5.1)
Sodium: 131 mmol/L — ABNORMAL LOW (ref 135–145)

## 2021-08-28 LAB — CBC
HCT: 33.5 % — ABNORMAL LOW (ref 36.0–46.0)
Hemoglobin: 10.9 g/dL — ABNORMAL LOW (ref 12.0–15.0)
MCH: 24.2 pg — ABNORMAL LOW (ref 26.0–34.0)
MCHC: 32.5 g/dL (ref 30.0–36.0)
MCV: 74.3 fL — ABNORMAL LOW (ref 80.0–100.0)
Platelets: 273 10*3/uL (ref 150–400)
RBC: 4.51 MIL/uL (ref 3.87–5.11)
RDW: 13.4 % (ref 11.5–15.5)
WBC: 6 10*3/uL (ref 4.0–10.5)
nRBC: 0 % (ref 0.0–0.2)

## 2021-08-28 LAB — GLUCOSE, CAPILLARY
Glucose-Capillary: 119 mg/dL — ABNORMAL HIGH (ref 70–99)
Glucose-Capillary: 194 mg/dL — ABNORMAL HIGH (ref 70–99)

## 2021-08-28 MED ORDER — POTASSIUM CHLORIDE CRYS ER 20 MEQ PO TBCR
40.0000 meq | EXTENDED_RELEASE_TABLET | Freq: Once | ORAL | Status: AC
  Administered 2021-08-28: 40 meq via ORAL
  Filled 2021-08-28: qty 2

## 2021-08-28 MED ORDER — MAGNESIUM SULFATE 2 GM/50ML IV SOLN
2.0000 g | Freq: Once | INTRAVENOUS | Status: AC
  Administered 2021-08-28: 2 g via INTRAVENOUS
  Filled 2021-08-28: qty 50

## 2021-08-28 NOTE — Hospital Course (Signed)
Vanessa Esparza is a 81 y.o. female with medical history significant for hypertension, hypothyroidism, meningioma, history of renal cell carcinoma status post left partial nephrectomy, diabetes mellitus who was brought into the ER by EMS for evaluation after she was found on the floor at home by her husband. ?

## 2021-08-28 NOTE — Progress Notes (Signed)
?  Progress Note ? ? ?Patient: Vanessa Esparza WKG:[id number] DOB: 02/02/1941 DOA: 08/26/2021     1 ?DOS: the patient was seen and examined on 08/28/2021 ?  ?Brief hospital course: ?Vanessa Esparza is a 80 y.o. female with medical history significant for hypertension, hypothyroidism, meningioma, history of renal cell carcinoma status post left partial nephrectomy, diabetes mellitus who was brought into the ER by EMS for evaluation after she was found on the floor at home by her husband. ? ?Assessment and Plan: ?* Acute metabolic encephalopathy ?Unclear etiology.  No signs of dehydration or infection. ?--MRI brain showed findings concerning for recurrence of meningioma, although unlikely to be the cause of pt's AMS.  ?Plan: ?--neuro consult today ?--EEG ? ?Hypokalemia ?Patient noted to have hypokalemia most likely related to HCTZ ?Supplement potassium  ? ?Hyponatremia ?Appears chronic ? ?Diabetes mellitus type 2, uncomplicated (HCC) ?--no need for BG checks and SSI ? ?Hypertension ?Hold hydrochlorothiazide due to hyponatremia ?Place patient on lisinopril 20 mg daily ? 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 ?Personal history of renal cell carcinoma ?Stable ?Status post left nephrectomy ? 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  Now, pt needs 2 people assist just to get up.  Unclear etiology.   ?Plan: ?--neuro consult today ?--PT/OT rec SNF rehab ? ? ?Mild fever ?--temp 100.7 last night ?--urine cx neg, CXR neg, covid neg ?Plan: ?--monitor without abx ? ?History of meningioma ?--pt previously had her tumor resected in 2010 by Dr. Kathie Dike at [facility]. ?--current MRI brain showed likely recurrence which extends to the right sigmoid sinus and may be impacting venous drainage. ?--neurosurgery consulted ?Plan: ?--outpatient f/u with neurosurgery Dr. Izora Ribas (pt does not want to go back  to [facility]). ? ?Fall ?Patient was found on the floor at home and it is unclear what happened since it was unwitnessed ?--PT ? ?Hypothyroidism (acquired) ?Stable ?Continue Synthroid ? ? ? ? ?  ? ?Subjective:  ?Pt showed poor coordination with feeding herself apple sauce, and per RN, need 2 people assist to get up.   ? ?Due to seemingly acute onset weakness and confusion, Neuro consulted today. ? ? ?Physical Exam: ? ?Constitutional: NAD, AAOx3, but slow response ?HEENT: conjunctivae and lids normal, EOMI ?CV: No cyanosis.   ?RESP: normal respiratory effort, on RA ?Extremities: No effusions, edema in BLE ?SKIN: warm, dry ?Neuro: II - XII grossly intact.   ?Psych: flat mood and affect.   ? ? ?Data Reviewed: ? ?Family Communication:  ?Disposition: ?Status is: Inpatient ? ? Planned Discharge Destination: Home  family declined SNF rehab ? ? ? ?Time spent: 50 minutes ? ?Author: ?Enzo Bi, MD ?08/28/2021 8:35 PM ? ?For on call review [email or web address].  ?

## 2021-08-28 NOTE — Progress Notes (Signed)
Occupational Therapy Treatment ?Patient Details ?Name: Vanessa Esparza ?MRN: [id number] ?DOB: 05-27-1940 ?Today's Date: 08/28/2021 ? ? ?History of present illness Pt is an 82 y/o F who presented on 08/26/21 after a possible fall. Pt noted to have hypokalemia & hyponatremia & is admitted to the hospital for further evaluation. 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 PMH: seizures, HTN, DM, meningioma s/p craniotomy ?  ?OT comments ? Ms Morrell Riddle was seen for OT treatment on this date. Upon arrival to room pt reclined in bed, states location as hospital but repeatedly states location as cities other than [place]. States age as 81yo, unable to accurately state year of birth date.  ? ?Pt requires MIN A bed mobility. MAX A x2 + RW for BSC t/f and pericare in standing. MAX A don/doff B socks at bed level. SETUP + CGA self-drinking at bed level. Pt making progress toward goals. Pt continues to benefit from skilled OT services to maximize return to PLOF and minimize risk of future falls, injury, caregiver burden, and readmission. Will continue to follow POC. Discharge recommendation remains appropriate.  ?  ? ?Recommendations for follow up therapy are one component of a multi-disciplinary discharge planning process, led by the attending physician.  Recommendations may be updated based on patient status, additional functional criteria and insurance authorization. ?   ?Follow Up Recommendations ? Skilled nursing-short term rehab (<3 hours/day)  ?  ?Assistance Recommended at Discharge Frequent or constant Supervision/Assistance  ?Patient can return home with the following ? Two people to help with walking and/or transfers;Two people to help with bathing/dressing/bathroom;Help with stairs or ramp for entrance ?   ?Equipment Recommendations ? Hospital bed;BSC/3in1  ?  ?Recommendations for Other Services   ? ?  ?Precautions / Restrictions Precautions ?Precautions: Fall ?Restrictions ?Weight Bearing Restrictions: No  ? ? ?  ? ?Mobility Bed Mobility ?Overal bed mobility: Needs Assistance ?Bed Mobility: Supine to Sit, Sit to Supine ?  ?  ?Supine to sit: Min assist, HOB elevated ?Sit to supine: Min assist, HOB elevated ?  ?  ?  ? ?Transfers ?Overall transfer level: Needs assistance ?Equipment used: Rolling walker (2 wheels) ?Transfers: Sit to/from Stand, Bed to chair/wheelchair/BSC ?Sit to Stand: Max assist, +2 physical assistance ?  ?  ?Step pivot transfers: Max assist, +2 safety/equipment ?  ?  ?  ?  ?  ?Balance Overall balance assessment: Needs assistance ?Sitting-balance support: Feet supported, Bilateral upper extremity supported ?Sitting balance-Leahy Scale: Fair ?Sitting balance - Comments: supervision static sitting EOB ?  ?Standing balance support: Bilateral upper extremity supported, During functional activity ?Standing balance-Leahy Scale: Poor ?  ?  ?  ?  ?  ?  ?  ?  ?  ?  ?  ?  ?   ? ?ADL either performed or assessed with clinical judgement  ? ?ADL Overall ADL's : Needs assistance/impaired ?  ?  ?  ?  ?  ?  ?  ?  ?  ?  ?  ?  ?  ?  ?  ?  ?  ?  ?  ?General ADL Comments: MAX A x2 + RW for BSC t/f and pericare in standing. MAX A don/doff B socks at bed level. SETUP + CGA self-drinking at bed level ?  ? ? ? ?Cognition Arousal/Alertness: Awake/alert ?Behavior During Therapy: [facility] for  tasks assessed/performed ?Overall Cognitive Status: Impaired/Different from baseline ?Area of Impairment: Orientation, Memory, Attention, Following commands, Awareness, Problem solving, Rancho level ?  ?  ?  ?  ?  ?  ?  ?  ?Orientation Level: Disoriented to, Place, Situation ?  ?  ?Following Commands: Follows one step commands consistently, Follows one step commands with increased time ?  ?Awareness: Emergent, Anticipatory ?Problem Solving:  Difficulty sequencing, Requires verbal cues, Slow processing, Decreased initiation ?  ?  ?  ?   ?   ?   ?   ? ? ?Pertinent Vitals/ Pain       Pain Assessment ?Pain Assessment: No/denies pain ? ? ?Frequency ? Min 3X/week  ? ? ? ? ?  ?Progress Toward Goals ? ?OT Goals(current goals can now be found in the care plan section) ? Progress towards OT goals: Progressing toward goals ? ?Acute Rehab OT Goals ?Patient Stated Goal: to eat ?OT Goal Formulation: With patient/family ?Time For Goal Achievement: 09/10/21 ?Potential to Achieve Goals: Good ?ADL Goals ?Pt Will Perform Grooming: with supervision;sitting ?Pt Will Perform Lower Body Dressing: with min assist;sit to/from stand ?Pt Will Transfer to Toilet: with min assist;ambulating;bedside commode ?Pt Will Perform Toileting - Clothing Manipulation and hygiene: with min guard assist;sitting/lateral leans  ?Plan Discharge plan remains appropriate;Frequency remains appropriate   ? ?Co-evaluation ? ? ?   ?  ?  ?  ?  ? ?  ?AM-PAC OT "6 Clicks" Daily Activity     ?Outcome Measure ? ? Help from another person eating meals?: A Little ?Help from another person taking care of personal grooming?: A Lot ?Help from another person toileting, which includes using toliet, bedpan, or urinal?: A Lot ?Help from another person bathing (including washing, rinsing, drying)?: A Lot ?Help from another person to put on and taking off regular upper body clothing?: A Little ?Help from another person to put on and taking off regular lower body clothing?: A Lot ?6 Click Score: 14 ? ?  ?End of Session Equipment Utilized During Treatment: Rolling walker (2 wheels) ? ?OT Visit Diagnosis: Other abnormalities of gait and mobility (R26.89);Muscle weakness (generalized) (M62.81) ?  ?Activity Tolerance Patient tolerated treatment well ?  ?Patient Left in bed;with call bell/phone within reach;with bed alarm set;with nursing/sitter in room ?  ?Nurse Communication Mobility status ?  ? ?   ? ?Time: 7846-9629 ?OT  Time Calculation (min): 30 min ? ?Charges: OT General Charges ?$OT Visit: 1 Visit ?OT Treatments ?$Self Care/Home Management : 23-37 mins ? ?Dessie Coma, M.S. OTR/L  ?08/28/21, 4:05 PM  ?ascom [phone] ? ?

## 2021-08-28 NOTE — TOC Progression Note (Signed)
Transition of Care (TOC) - Progression Note  ? ? ?Patient Details  ?Name: Vanessa Esparza ?MRN: [id number] ?Date of Birth: April 29, 1941 ? ?Transition of Care (TOC) CM/SW Contact  ?Laurena Slimmer, RN ?Phone Number: ?08/28/2021, 3:22 PM ? ?Clinical Narrative:    ?Referral confirmed with Corene Cornea at [facility]. Patient has been accepted for care when stable to discharge.  ? ? ?Expected Discharge Plan: [place] ?Barriers to Discharge: Continued Medical Work up ? ?Expected Discharge Plan and Services ?Expected Discharge Plan: [place] ?In-house Referral: NA ?  ?Post Acute Care Choice: Home Health ?Living arrangements for the past 2 months: [place] ?                ?  ?  ?  ?  ?  ?  ?  ?  ?  ?  ? ? ?Social Determinants of Health (SDOH) Interventions ?  ? ?Readmission Risk Interventions ?   ? View : No data to display.  ?  ?  ?  ? ? ?

## 2021-08-28 NOTE — Consult Note (Signed)
? ?Referring Physician:  ?No referring provider defined for this encounter. ? ?Primary Physician:  ?[name], MD ? ?Chief Complaint:  confusion ? ?History of Present Illness: ?08/28/2021 ?Vanessa Esparza is a 81 y.o. female who presents with the chief complaint of altered mental status.  She was found down on the floor by her husband.  She was unable to get up, so EMS was called.  She was brought to the hospital where she was noted not to be at her baseline.  She has not been feeling well for several days.  She appeared weak and had poor p.o. intake. ? ?At baseline, she lives with her husband at home.  She is able to perform her activities of daily living. ? 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 ? ?She has been hospitalized for a couple of days, and has had improvement in her mental status over that time.  She is feeling back near her baseline. ? ?Review of Systems:  ?A 10 point review of systems is negative, except for the pertinent positives and negatives detailed in the HPI. ? ?Past Medical History: ?Past Medical History:  ?Diagnosis Date  ? Anemia   ? Arthritis   ? Collagen vascular disease ([place])   ? Rhematoid Arthritis  ? Diabetes mellitus without complication ([place])   ? GERD (gastroesophageal reflux disease)   ? Gout   ? Hyperlipidemia   ? Hypertension   ? Hypothyroidism   ? Meningioma ([place])   ? Psoriasis   ? Renal cell carcinoma, left ([place]) 05/2011  ? Left Partial Nephrectomy  ? Seizure ([place])   ? Seizures ([place])   ? LAST SEIZURE ON 02-08-09  ? ? ?Past Surgical History: ?Past Surgical History:  ?Procedure Laterality Date  ? ABDOMINAL HYSTERECTOMY    ? APPENDECTOMY    ? BACK SURGERY    ? LUMBAR  ? BRAIN MENINGIOMA EXCISION    ? BRAIN SURGERY    ? CRANIOTOMY WITH EXCISION OF SUPRATENTORIAL MENINGIOMA  ? COLONOSCOPY    ? COLONOSCOPY WITH PROPOFOL N/A 01/28/2017  ? Procedure: COLONOSCOPY WITH PROPOFOL;  Surgeon: Lollie Sails, MD;   Location: [facility] ENDOSCOPY;  Service: Endoscopy;  Laterality: N/A;  ? COLONOSCOPY WITH PROPOFOL N/A 01/01/2021  ? Procedure: COLONOSCOPY WITH PROPOFOL;  Surgeon: Annamaria Helling, DO;  Location: [facility] ENDOSCOPY;  Service: Gastroenterology;  Laterality: N/A;  ? CRANIOTOMY    ? w/ excision supratentorial meningioma  ? ESOPHAGOGASTRODUODENOSCOPY    ? ESOPHAGOGASTRODUODENOSCOPY (EGD) WITH PROPOFOL N/A 01/28/2017  ? Procedure: ESOPHAGOGASTRODUODENOSCOPY (EGD) WITH PROPOFOL;  Surgeon: Lollie Sails, MD;  Location: [facility] ENDOSCOPY;  Service: Endoscopy;  Laterality: N/A;  ? ESOPHAGOGASTRODUODENOSCOPY (EGD) WITH PROPOFOL N/A 01/01/2021  ? Procedure: ESOPHAGOGASTRODUODENOSCOPY (EGD) WITH PROPOFOL;  Surgeon: Annamaria Helling, DO;  Location: [facility] ENDOSCOPY;  Service: Gastroenterology;  Laterality: N/A;  ? HEMORRHOID SURGERY    ? KNEE ARTHROSCOPY Right 09/21/2015  ? Procedure: ARTHROSCOPY KNEE, LATERAL AND MEDIAL MENISECTOMY;  Surgeon: Hessie Knows, MD;  Location: [facility] ORS;  Service: Orthopedics;  Laterality: Right;  ? LAPAROSCOPIC BILATERAL SALPINGO OOPHERECTOMY Bilateral 04/12/2019  ? Procedure: LAPAROSCOPIC BILATERAL SALPINGO OOPHORECTOMY;  Surgeon: Ward, Honor Loh, MD;  Location: [facility] ORS;  Service: Gynecology;  Laterality: Bilateral;  ? PARTIAL NEPHRECTOMY Left   ? DUE TO RENAL CELL CARCINOMA  ? REDUCTION MAMMAPLASTY Bilateral   ? THYROID SURGERY    ? TOTAL KNEE ARTHROPLASTY Right 06/30/2018  ? Procedure: TOTAL KNEE ARTHROPLASTY-RIGHT;  Surgeon: Hessie Knows, MD;  Location: [facility] ORS;  Service: Orthopedics;  Laterality: Right;  ? ? ?Allergies: ?Allergies as of 08/26/2021 - Review Complete 08/26/2021  ?Allergen Reaction Noted  ? Arava [leflunomide]  06/17/2018  ? Methotrexate derivatives Swelling 03/26/2018  ? Statins Other (See Comments) 09/12/2015  ? ? ?Medications: ? ?Current Facility-Administered Medications:  ?  acetaminophen (TYLENOL) tablet 650 mg, 650 mg, Oral, Q6H PRN, Agbata, Tochukwu, MD ?  allopurinol  (ZYLOPRIM) tablet 100 mg, 100 mg, Oral, Daily, Agbata, Tochukwu, MD, 100 mg at 08/27/21 1001 ?  calcium carbonate (OS-CAL - dosed in mg of elemental calcium) tablet 625 mg, 625 mg, Oral, Q breakfast, Agbata, Tochukwu, MD, 625 mg at 08/27/21 1006 ?  enoxaparin (LOVENOX) injection 40 mg, 40 mg, Subcutaneous, Daily, Agbata, Tochukwu, MD, 40 mg at 08/27/21 1002 ?  feeding supplement (ENSURE ENLIVE / ENSURE PLUS) liquid 237 mL, 237 mL, Oral, TID BM, Enzo Bi, MD, 237 mL at 38/75/64 3329 ?  folic acid (FOLVITE) tablet 1 mg, 1,000 mcg, Oral, Daily, Agbata, Tochukwu, MD, 1 mg at 08/27/21 1001 ?  levETIRAcetam (KEPPRA) IVPB 500 mg/100 mL premix, 500 mg, Intravenous, Q12H, Foust, Katy L, NP, Last Rate: 400 mL/hr at 08/27/21 2046, 500 mg at 08/27/21 2046 ?  levothyroxine (SYNTHROID) tablet 150 mcg, 150 mcg, Oral, QAC breakfast, Agbata, Tochukwu, MD, 150 mcg at 08/28/21 0532 ?  lisinopril (ZESTRIL) tablet 20 mg, 20 mg, Oral, Daily, Agbata, Tochukwu, MD, 20 mg at 08/27/21 1000 ?  loratadine (CLARITIN) tablet 10 mg, 10 mg, Oral, Daily, Agbata, Tochukwu, MD, 10 mg at 08/27/21 1001 ?  mirabegron ER (MYRBETRIQ) tablet 50 mg, 50 mg, Oral, Daily, Agbata, Tochukwu, MD, 50 mg at 08/27/21 1007 ?  multivitamin with minerals tablet 1 tablet, 1 tablet, Oral, Daily, Enzo Bi, MD, 1 tablet at 08/27/21 1648 ?  nystatin (MYCOSTATIN) 100000 UNIT/ML suspension 500,000 Units, 5 mL, Oral, QID, Enzo Bi, MD, 500,000 Units at 08/27/21 2110 ?  ondansetron (ZOFRAN) tablet 4 mg, 4 mg, Oral, Q6H PRN **OR** ondansetron (ZOFRAN) injection 4 mg, 4 mg, Intravenous, Q6H PRN, Agbata, Tochukwu, MD ?  pantoprazole (PROTONIX) injection 40 mg, 40 mg, Intravenous, Q12H, Foust, Katy L, NP, 40 mg at 08/27/21 2110 ?  sucralfate (CARAFATE) tablet 1 g, 1 g, Oral, BID AC, Agbata, Tochukwu, MD, 1 g at 08/27/21 1649 ? ? ?Social History: ?Social History  ? ?Tobacco Use  ? Smoking status: Never  ? Smokeless tobacco: Never  ?Vaping Use  ? Vaping Use: Never used  ?Substance  Use Topics  ? Alcohol use: No  ? Drug use: No  ? ? ?Family Medical History: ?Family History  ?Problem Relation Age of Onset  ? Breast cancer Other   ? Breast cancer Mother   ? ? ?Physical Examination: ?Vitals:  ? 08/27/21 1924 08/28/21 0351  ?BP: 129/62 (!) 166/69  ?Pulse: 83 74  ?Resp: 18 16  ?Temp: 98.8 ?F (37.1 ?C) 98.4 ?F (36.9 ?C)  ?SpO2: 96% 96%  ? ? ? ?General: Patient is well developed, well nourished, calm, collected, and in no apparent distress. ? ?Psychiatric: Patient is non-anxious. ? ?Head:  Pupils equal, round, and reactive to light. ? ?ENT:  Oral mucosa appears well hydrated. ? ?Neck:   Supple.  Full range of motion. ? ?Respiratory: Patient is breathing without any difficulty. ? ?Extremities: No edema. ? ?Vascular: Palpable pulses in dorsal pedal vessels. ? ?Skin:   On exposed skin, there are no abnormal skin lesions. ? ?NEUROLOGICAL:  ?General: In no acute distress.   ?Awake, alert, oriented  to person, place, and time.  Pupils equal round and reactive to light.  Facial tone is symmetric.  Tongue protrusion is midline.  There is no pronator drift. ? ? ?Strength: ?Side Biceps Triceps Deltoid Interossei Grip Wrist Ext. Wrist Flex.  ?R '5 5 5 5 5 5 5  '$ ?L '5 5 5 5 5 5 5  '$ ? ?Side Iliopsoas Quads Hamstring PF DF EHL  ?R '5 5 5 5 5 5  '$ ?L '5 5 5 5 5 5  '$ ? ? ?Bilateral upper and lower extremity sensation is intact to light touch. ?Reflexes are 1+ and symmetric at the biceps, triceps, brachioradialis, patella and achilles. Hoffman's is absent. ? ?Clonus is not present.  Toes are down-going.   ? ?Gait is untested. ? ?Imaging: ?MRI Brain 08/26/2021 ?IMPRESSION: ?1. No acute intracranial abnormality or evidence for acute trauma ?2. Progressive intra and extracranial soft tissue at the operative ?site, likely representing recurrent meningioma. MRI of the brain ?with contrast could be used for further evaluation if clinically ?indicated. ?3. Probable tumor recurrence extends to the right sigmoid sinus and ?may be  impacting venous drainage. ?4. Chronic right mastoid effusion. ?  ?  ?Electronically Signed ?  By: San Morelle M.D. ?  On: 08/26/2021 14:45 ? ?I have personally reviewed the images and agree with the abo

## 2021-08-28 NOTE — Consult Note (Addendum)
?                    NEURO HOSPITALIST CONSULT NOTE  ? ?Requestig physician: Dr. Billie Ruddy ? ?Reason for Consult: AMS ? ?History obtained from:  Family and Chart    ? ?HPI:                                                                                                                                         ? Vanessa Esparza is an 81 y.o. female with a PMHx of seizure (once in 2010 with none since, occurred a few months after her meningioma resection, on Keppra at home), HTN, DM, meningioma s/p resection, renal cell carcinoma s/p partial left nephrectomy who presented to the ED on Sunday after a possible fall at home. Husband had reported that she went to bed at her normal time (between 7-8pm) on Saturday night; when he went to bed around 11 pm, he discovered that when she apparently went to the bathroom and then apparently either fell or slid to the floor in her own bedroom. Patient was unable to tell him if she fell and actually thought that she was laying in bed. Husband was unable to help her up due to his own health issues. EMS was called and on their evaluation they noted confusion and unsteadiness. EMS had also noted a strong ammonia odor on the patient at home. No seizure activity was witnessed by family or by EMS, and the patient was not certain regarding what had happened at home to bring her to the ED. She was oriented to herself and the location on initial ED evaluation, but could not recall several other important facts, such as who the president is and what the date was. When asked how tall she was, she stated "8 feet". There was no known prior diagnosis of dementia. ED work up revealed an essentially normal CBC without leukocytosis, mild hyponatremia and mild hypokalemia. U/A was without clear signs of infection.  ? ?Husband also reported that she has not been eating or drinking well for about a week, in conjunction with nausea and vomiting. The patient visited the ED and noted that his wife's confusion  there was not her baseline. Husband did note that the patient had a similar episode about 8 months ago and at that time was admitted to the hospital with altered mental status and treated for pneumonia with improvement in her symptoms.  ? ?On Sunday morning in the late afternoon, son visited the patient in the hospital and offered a different insight than husband into patient's condition. Son stated that she appeared to be at her baseline in the hospital in terms of cognition. He stated that family has had concerns over the last few months about some memory issues that might be indicative of initial stages of dementia. However, today son states that the patient actually had worsened  initially during her stay, then gradually has improved and that today she is at her best so far cognitively for this hospital visit.  ? ?Her home Keppra was continued during this admission. An MRI brain was obtained, which revealed findings concerning for recurrence of meningioma. Neurosurgery was then consulted and recommended serial imaging of the meningioma with outpatient Neurosurgery follow up. Neurosurgery did not feel that the meningioma explained her presentation.  ? ?Past Medical History:  ?Diagnosis Date  ? Anemia   ? Arthritis   ? Collagen vascular disease ([place])   ? Rhematoid Arthritis  ? Diabetes mellitus without complication ([place])   ? GERD (gastroesophageal reflux disease)   ? Gout   ? Hyperlipidemia   ? Hypertension   ? Hypothyroidism   ? Meningioma ([place])   ? Psoriasis   ? Renal cell carcinoma, left ([place]) 05/2011  ? Left Partial Nephrectomy  ? Seizure ([place])   ? Seizures ([place])   ? LAST SEIZURE ON 02-08-09  ? ? ?Past Surgical History:  ?Procedure Laterality Date  ? ABDOMINAL HYSTERECTOMY    ? APPENDECTOMY    ? BACK SURGERY    ? LUMBAR  ? BRAIN MENINGIOMA EXCISION    ? BRAIN SURGERY    ? CRANIOTOMY WITH EXCISION OF SUPRATENTORIAL MENINGIOMA  ? COLONOSCOPY    ? COLONOSCOPY WITH PROPOFOL N/A 01/28/2017  ? Procedure: COLONOSCOPY  WITH PROPOFOL;  Surgeon: Lollie Sails, MD;  Location: [facility] ENDOSCOPY;  Service: Endoscopy;  Laterality: N/A;  ? COLONOSCOPY WITH PROPOFOL N/A 01/01/2021  ? Procedure: COLONOSCOPY WITH PROPOFOL;  Surgeon: Annamaria Helling, DO;  [facility];  Service: Gastroenterology;  Laterality: N/A;  ? CRANIOTOMY    ? w/ excision supratentorial meningioma  ? ESOPHAGOGASTRODUODENOSCOPY    ? ESOPHAGOGASTRODUODENOSCOPY (EGD) WITH PROPOFOL N/A 01/28/2017  ? Procedure: ESOPHAGOGASTRODUODENOSCOPY (EGD) WITH PROPOFOL;  Surgeon: Lollie Sails, MD;  Location: [facility] ENDOSCOPY;  Service: Endoscopy;  Laterality: N/A;  ? ESOPHAGOGASTRODUODENOSCOPY (EGD) WITH PROPOFOL N/A 01/01/2021  ? Procedure: ESOPHAGOGASTRODUODENOSCOPY (EGD) WITH PROPOFOL;  Surgeon: Annamaria Helling, DO;  Location: [facility] ENDOSCOPY;  Service: Gastroenterology;  Laterality: N/A;  ? HEMORRHOID SURGERY    ? KNEE ARTHROSCOPY Right 09/21/2015  ? Procedure: ARTHROSCOPY KNEE, LATERAL AND MEDIAL MENISECTOMY;  Surgeon: Hessie Knows, MD;  Location: [facility] ORS;  Service: Orthopedics;  Laterality: Right;  ? LAPAROSCOPIC BILATERAL SALPINGO OOPHERECTOMY Bilateral 04/12/2019  ? Procedure: LAPAROSCOPIC BILATERAL SALPINGO OOPHORECTOMY;  Surgeon: Ward, Honor Loh, MD;  Location: [facility] ORS;  Service: Gynecology;  Laterality: Bilateral;  ? PARTIAL NEPHRECTOMY Left   ? DUE TO RENAL CELL CARCINOMA  ? REDUCTION MAMMAPLASTY Bilateral   ? THYROID SURGERY    ? TOTAL KNEE ARTHROPLASTY Right 06/30/2018  ? Procedure: TOTAL KNEE ARTHROPLASTY-RIGHT;  Surgeon: Hessie Knows, MD;  Location: [facility] ORS;  Service: Orthopedics;  Laterality: Right;  ? ? ?Family History  ?Problem Relation Age of Onset  ? Breast cancer Other   ? Breast cancer Mother   ?         ? ?Social History:  reports that she has never smoked. She has never used smokeless tobacco. She reports that she does not drink alcohol and does not use drugs. ? ?Allergies  ?Allergen Reactions  ? Arava [Leflunomide]   ?  Swelling  tongue and ulcers in mouth   ? Methotrexate Derivatives Swelling  ?  Tongue swelling  ? Statins Other (See Comments)  ?  BLOATED FEELING, CHEST FULLNESS  ? ? ?MEDICATIONS:                                                                                                                     ?  Prior to Admission:  ?Medications Prior to Admission  ?Medication Sig Dispense Refill Last Dose  ? acetaminophen (TYLENOL) 325 MG tablet Take 2 tablets (650 mg total) by mouth every 6 (six) hours as needed for mild pain (or Fever >/= 101).   Past Month  ? allopurinol (ZYLOPRIM) 100 MG tablet Take 100 mg by mouth daily.    08/25/2021  ? calcium carbonate (OS-CAL) 600 MG TABS tablet Take 600 mg by mouth daily.   08/25/2021  ? fexofenadine (ALLEGRA) 180 MG tablet Take 180 mg by mouth daily.   08/25/2021  ? folic acid (FOLVITE) 619 MCG tablet Take 800 mcg by mouth daily as needed (upset stomach).   08/25/2021  ? glipiZIDE (GLUCOTROL XL) 5 MG 24 hr tablet Take 5 mg by mouth daily.   08/25/2021  ? levETIRAcetam (KEPPRA) 500 MG tablet Take 500 mg by mouth 2 (two) times daily.   08/25/2021  ? levothyroxine (SYNTHROID, LEVOTHROID) 150 MCG tablet Take 150 mcg by mouth daily before breakfast.   08/25/2021  ? lisinopril-hydrochlorothiazide (PRINZIDE,ZESTORETIC) 20-25 MG tablet Take 1 tablet by mouth daily.   08/25/2021  ? mirabegron ER (MYRBETRIQ) 50 MG TB24 tablet Take 1 tablet (50 mg total) by mouth daily. 90 tablet 3 08/25/2021  ? omeprazole (PRILOSEC) 40 MG capsule Take 40 mg by mouth daily.   Past Week  ? pantoprazole (PROTONIX) 40 MG tablet Take 40 mg by mouth daily.   Past Month  ? sucralfate (CARAFATE) 1 g tablet Take 1 g by mouth 2 (two) times daily.   Past Month  ? clobetasol (TEMOVATE) 0.05 % external solution Apply topically 2 (two) times daily.     ? Fluocinolone Acetonide Scalp 0.01 % OIL Apply topically.     ? fluocinonide (LIDEX) 0.05 % external solution Apply topically 2 (two) times daily as needed.     ? glucose blood test strip USE 1  STRIP TO CHECK GLUCOSE TWICE DAILY     ? ondansetron (ZOFRAN-ODT) 4 MG disintegrating tablet Take 4 mg by mouth every 8 (eight) hours as needed for nausea or vomiting. (Patient not taking: Reported on

## 2021-08-28 NOTE — Assessment & Plan Note (Signed)
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  Now, pt needs 2 people assist just to get up.  Unclear etiology.   ?Plan: ?--neuro consult today ?--PT/OT rec SNF rehab ? ?

## 2021-08-29 ENCOUNTER — Encounter: Payer: Self-pay | Admitting: Hospitalist

## 2021-08-29 DIAGNOSIS — R4182 Altered mental status, unspecified: Secondary | ICD-10-CM | POA: Diagnosis not present

## 2021-08-29 DIAGNOSIS — G9341 Metabolic encephalopathy: Secondary | ICD-10-CM | POA: Diagnosis not present

## 2021-08-29 LAB — CBC
HCT: 34.1 % — ABNORMAL LOW (ref 36.0–46.0)
Hemoglobin: 11.1 g/dL — ABNORMAL LOW (ref 12.0–15.0)
MCH: 23.8 pg — ABNORMAL LOW (ref 26.0–34.0)
MCHC: 32.6 g/dL (ref 30.0–36.0)
MCV: 73 fL — ABNORMAL LOW (ref 80.0–100.0)
Platelets: 303 10*3/uL (ref 150–400)
RBC: 4.67 MIL/uL (ref 3.87–5.11)
RDW: 13.7 % (ref 11.5–15.5)
WBC: 6.2 10*3/uL (ref 4.0–10.5)
nRBC: 0 % (ref 0.0–0.2)

## 2021-08-29 LAB — BASIC METABOLIC PANEL
Anion gap: 6 (ref 5–15)
BUN: 12 mg/dL (ref 8–23)
CO2: 28 mmol/L (ref 22–32)
Calcium: 8.5 mg/dL — ABNORMAL LOW (ref 8.9–10.3)
Chloride: 94 mmol/L — ABNORMAL LOW (ref 98–111)
Creatinine, Ser: 0.52 mg/dL (ref 0.44–1.00)
GFR, Estimated: >60 mL/min (ref >60)
Glucose, Bld: 128 mg/dL — ABNORMAL HIGH (ref 70–99)
Potassium: 3.7 mmol/L (ref 3.5–5.1)
Sodium: 128 mmol/L — ABNORMAL LOW (ref 135–145)

## 2021-08-29 LAB — GLUCOSE, CAPILLARY
Glucose-Capillary: 142 mg/dL — ABNORMAL HIGH (ref 70–99)
Glucose-Capillary: 172 mg/dL — ABNORMAL HIGH (ref 70–99)
Glucose-Capillary: 166 mg/dL — ABNORMAL HIGH (ref 70–99)

## 2021-08-29 LAB — MAGNESIUM: Magnesium: 1.7 mg/dL (ref 1.7–2.4)

## 2021-08-29 NOTE — Progress Notes (Signed)
Occupational Therapy Treatment ?Patient Details ?Name: Vanessa Esparza ?MRN: [id number] ?DOB: 18-Apr-1941 ?Today's Date: 08/29/2021 ? ? ?History of present illness Pt is an 81 y/o F who presented on 08/26/21 after a possible fall. Pt noted to have hypokalemia & hyponatremia & is admitted to the hospital for further evaluation. 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 PMH: seizures, HTN, DM, meningioma s/p craniotomy ?  ?OT comments ? Ms Vallie was seen for OT treatment on this date. Upon arrival to room pt reclined in bed, son at bedside, agreeable to tx. Pt requires MIN A + MAX cues + significantly increased time to exit L side of bed. MAX A x2 + RW sit<>stand at EOB with assist from pt's son who demonstrates good understanding of safe transfers and RW use. Pt incontinent of urine in standing - MAX A x2 + RW for for BSC t/f and pericare in standing. MAX A don/doff B socks in sitting - pt appears to freeze when initiating and forgetful of what task she initiated. MAX A x2 + RW step pivot transfer BSC>chair with assist to advance RLE as pt fatigues. Left in chair all needs in reach with family at bedside - RN aware. Pt making good progress toward goals. Will continue to follow POC. Discharge recommendation remains appropriate.  ?  ? ?Recommendations for follow up therapy are one component of a multi-disciplinary discharge planning process, led by the attending physician.  Recommendations may be updated based on patient status, additional functional criteria and insurance authorization. ?   ?Follow Up Recommendations ? Skilled nursing-short term rehab (<3 hours/day)  ?  ?Assistance Recommended at Discharge Frequent or constant Supervision/Assistance  ?Patient can return home with the following ? Two people  to help with walking and/or transfers;Two people to help with bathing/dressing/bathroom;Help with stairs or ramp for entrance ?  ?Equipment Recommendations ? Hospital bed;BSC/3in1  ?  ?Recommendations for Other Services   ? ?  ?Precautions / Restrictions Precautions ?Precautions: Fall ?Restrictions ?Weight Bearing Restrictions: No  ? ? ?  ? ?Mobility Bed Mobility ?Overal bed mobility: Needs Assistance ?Bed Mobility: Supine to Sit ?  ?  ?Supine to sit: Min assist, HOB elevated ?  ?  ?General bed mobility comments: significantly increased time ?  ? ?Transfers ?Overall transfer level: Needs assistance ?Equipment used: Rolling walker (2 wheels) ?Transfers: Sit to/from Stand, Bed to chair/wheelchair/BSC ?Sit to Stand: Max assist, +2 physical assistance ?  ?  ?Step pivot transfers: Max assist, +2 safety/equipment ?  ?  ?General transfer comment: assist to offload and advance RLE ?  ?  ?Balance Overall balance assessment: Needs assistance ?Sitting-balance support: Feet supported, Bilateral upper extremity supported ?Sitting balance-Leahy Scale: Fair ?Sitting balance - Comments: supervision static sitting EOB ?  ?Standing balance support: Bilateral upper extremity supported, During functional activity ?Standing balance-Leahy Scale: Poor ?  ?  ?  ?  ?  ?  ?  ?  ?  ?  ?  ?  ?   ? ?ADL either performed or assessed with clinical judgement  ? ?ADL Overall ADL's : Needs assistance/impaired ?  ?  ?  ?  ?  ?  ?  ?  ?  ?  ?  ?  ?  ?  ?  ?  ?  ?  ?  ?General ADL Comments: MAX  A x2 + RW for BSC t/f and pericare in standing. MAX A don/doff B socks in sitting - pt appears to freeze when initiating and forgetful of what task she initiated. ?  ? ? ? ?Cognition Arousal/Alertness: Awake/alert ?Behavior During Therapy: [facility] for tasks assessed/performed ?Overall Cognitive Status: Impaired/Different from baseline ?Area of Impairment: Orientation, Memory, Attention, Following commands, Awareness, Problem solving, [place] level ?  ?  ?  ?  ?  ?  ?   ?  ?Orientation Level: Disoriented to, Situation ?  ?  ?Following Commands: Follows one step commands inconsistently ?  ?Awareness: Emergent ?Problem Solving: Difficulty sequencing, Requires verbal cues, Slow processing, Decreased initiation, Requires tactile cues ?General Comments: pt requires step by step cueing to sequence mobility. When unable to complete ADL pt freezes ?  ?  ?   ?   ?   ?   ? ? ?Pertinent Vitals/ Pain       Pain Assessment ?Pain Assessment: No/denies pain ? ? ?Frequency ? Min 3X/week  ? ? ? ? ?  ?Progress Toward Goals ? ?OT Goals(current goals can now be found in the care plan section) ?   ? ?Acute Rehab OT Goals ?OT Goal Formulation: With patient/family ?Time For Goal Achievement: 09/10/21 ?Potential to Achieve Goals: Good ?ADL Goals ?Pt Will Perform Grooming: with supervision;sitting ?Pt Will Perform Lower Body Dressing: with min assist;sit to/from stand ?Pt Will Transfer to Toilet: with min assist;ambulating;bedside commode ?Pt Will Perform Toileting - Clothing Manipulation and hygiene: with min guard assist;sitting/lateral leans  ?Plan Discharge plan remains appropriate;Frequency remains appropriate   ? ?Co-evaluation ? ? ?   ?  ?  ?  ?  ? ?  ?AM-PAC OT "6 Clicks" Daily Activity     ?Outcome Measure ? ? Help from another person eating meals?: A Little ?Help from another person taking care of personal grooming?: A Lot ?Help from another person toileting, which includes using toliet, bedpan, or urinal?: A Lot ?Help from another person bathing (including washing, rinsing, drying)?: A Lot ?Help from another person to put on and taking off regular upper body clothing?: A Little ?Help from another person to put on and taking off regular lower body clothing?: A Lot ?6 Click Score: 14 ? ?  ?End of Session Equipment Utilized During Treatment: Rolling walker (2 wheels) ? ?OT Visit Diagnosis: Other abnormalities of gait and mobility (R26.89);Muscle weakness (generalized) (M62.81) ?  ?Activity  Tolerance Patient tolerated treatment well ?  ?Patient Left in bed;with call bell/phone within reach;with bed alarm set;with nursing/sitter in room ?  ?Nurse Communication Mobility status ?  ? ?   ? ?Time: 6010-9323 ?OT Time Calculation (min): 31 min ? ?Charges: OT General Charges ?$OT Visit: 1 Visit ?OT Treatments ?$Self Care/Home Management : 23-37 mins ? ?Dessie Coma, M.S. OTR/L  ?08/29/21, 10:31 AM  ?ascom [phone] ? ?

## 2021-08-29 NOTE — TOC Progression Note (Signed)
Transition of Care (TOC) - Progression Note  ? ? ?Patient Details  ?Name: Vanessa Esparza ?MRN: [id number] ?Date of Birth: 10/03/1940 ? ?Transition of Care (TOC) CM/SW Contact  ?Genavie Boettger A Anyra Kaufman, LCSW ?Phone Number: ?08/29/2021, 10:45 AM ? ?Clinical Narrative:  CSW confirmed with Son that the family does not want to send pt to SNF. Pt family is ok with [facility] services as pt spouse is getting [place] now as well. Therapy for spouse has been arranged through [facility] and [facility] will service pt at d/c as well. Pt's son states they have a walker, wheelchair, ramp, bsc, and lowered bed frame at home. Pt's son states they house is set up great for both the pt and spouse. Pt's son states they are also looking in to private caregivers. Pt's son is not interested in hospital bed.   ? ? ? ?Expected Discharge Plan: [place] ?Barriers to Discharge: Continued Medical Work up ? ?Expected Discharge Plan and Services ?Expected Discharge Plan: [place] ?In-house Referral: NA ?  ?Post Acute Care Choice: Home Health ?Living arrangements for the past 2 months: [place] ?                ?  ?  ?  ?  ?  ?  ?  ?  ?  ?  ? ? ?Social Determinants of Health (SDOH) Interventions ?  ? ?Readmission Risk Interventions ?   ? View : No data to display.  ?  ?  ?  ? ? ?

## 2021-08-29 NOTE — Progress Notes (Signed)
Physical Therapy Treatment ?Patient Details ?Name: Vanessa Esparza ?MRN: [id number] ?DOB: March 22, 1941 ?Today's Date: 08/29/2021 ? ? ?History of Present Illness Pt is an 81 y/o F who presented on 08/26/21 after a possible fall. Pt noted to have hypokalemia & hyponatremia & is admitted to the hospital for further evaluation. 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 PMH: seizures, HTN, DM, meningioma s/p craniotomy ? ?  ?PT Comments  ? ? Pt is making gradual progress towards goals with ability to perform OOB mobility this date. Still remains confused and requires +2 for all mobility efforts. Vanessa Esparza in room and very helpful throughout session. RW used and able to perform supine/seated/standing there-ex. Will continue to progress as able.   ?Recommendations for follow up therapy are one component of a multi-disciplinary discharge planning process, led by the attending physician.  Recommendations may be updated based on patient status, additional functional criteria and insurance authorization. ? ?Follow Up Recommendations ? Skilled nursing-short term rehab (<3 hours/day) ?  ?  ?Assistance Recommended at Discharge Frequent or constant Supervision/Assistance  ?Patient can return home with the following Two people to help with walking and/or transfers;Two people to help with bathing/dressing/bathroom;Direct supervision/assist for medications management;Assist for transportation;Help with stairs or ramp for entrance;Assistance with cooking/housework;Direct supervision/assist for financial management ?  ?Equipment Recommendations ? Rolling walker (2 wheels);BSC/3in1  ?  ?Recommendations for Other Services   ? ? ?  ?Precautions / Restrictions Precautions ?Precautions: Fall ?Restrictions ?Weight Bearing Restrictions: No   ?  ? ?Mobility ? Bed Mobility ?Overal bed mobility: Needs Assistance ?Bed Mobility: Supine to Sit ?  ?  ?Supine to sit: Max assist, +2 for physical assistance ?Sit to supine: Max assist, +2 for physical assistance ?  ?General bed mobility comments: needs assist for sequencing. max assist for B LEs and HEAVY assist for trunkal elevation. Once seated at EOB, able to maintain with cga progressing to supervision ?  ? ?Transfers ?Overall transfer level: Needs assistance ?Equipment used: Rolling walker (2 wheels) ?Transfers: Sit to/from Stand, Bed to chair/wheelchair/BSC ?Sit to Stand: Min assist, +2 physical assistance, From elevated surface ?  ?  ?  ?  ?  ?General transfer comment: assist for blocking B feet and sequencing. Once standing, RW used. Bed elevated to help for success ?  ? ?Ambulation/Gait ?Ambulation/Gait assistance: Min assist, +2 physical assistance ?Gait Distance (Feet): 3 Feet ?Assistive device: Rolling walker (2 wheels) ?Gait Pattern/deviations: Step-to pattern ?  ?  ?  ?General Gait Details: side steps at EOB with RW. Quick fatigue ? ? ?Stairs ?  ?  ?  ?  ?  ? ? ?Wheelchair Mobility ?  ? ?Modified Rankin (Stroke Patients Only) ?  ? ? ?  ?Balance Overall balance assessment: Needs assistance ?Sitting-balance support: Feet supported, Bilateral upper extremity supported ?Sitting balance-Leahy Scale: Fair ?  ?  ?Standing balance support: Bilateral upper extremity supported, During functional activity ?Standing balance-Leahy Scale: Poor ?  ?  ?  ?  ?  ?  ?  ?  ?  ?  ?  ?  ?  ? ?  ?Cognition Arousal/Alertness: Awake/alert ?Behavior During Therapy: [facility] for tasks assessed/performed ?Overall Cognitive Status: Impaired/Different from baseline ?  ?  ?  ?  ?  ?  ?  ?  ?  ?  ?  ?  ?  ?  ?  ?  ?  General Comments: alert x 2, however needs cues for sequencing all mobility. Delayed processing ?  ?  ? ?  ?Exercises Other Exercises ?Other Exercises: supine ther-ex performed on B LE including quad sets, SLRs, and hip  abd/add, LAQ. 12 reps performed with mod/max assist ?Other Exercises: standing ther-ex performed on B LE including heel raises, alt marching, and attempted toe raises. 12 reps ? ?  ?General Comments   ?  ?  ? ?Pertinent Vitals/Pain Pain Assessment ?Pain Assessment: No/denies pain  ? ? ?Home Living   ?  ?  ?  ?  ?  ?  ?  ?  ?  ?   ?  ?Prior Function    ?  ?  ?   ? ?PT Goals (current goals can now be found in the care plan section) Acute Rehab PT Goals ?Patient Stated Goal: get better ?PT Goal Formulation: With patient ?Time For Goal Achievement: 09/10/21 ?Potential to Achieve Goals: Fair ?Progress towards PT goals: Progressing toward goals ? ?  ?Frequency ? ? ? Min 2X/week ? ? ? ?  ?PT Plan Current plan remains appropriate  ? ? ?Co-evaluation   ?  ?  ?  ?  ? ?  ?AM-PAC PT "6 Clicks" Mobility   ?Outcome Measure ? Help needed turning from your back to your side while in a flat bed without using bedrails?: A Little ?Help needed moving from lying on your back to sitting on the side of a flat bed without using bedrails?: A Lot ?Help needed moving to and from a bed to a chair (including a wheelchair)?: Total ?Help needed standing up from a chair using your arms (e.g., wheelchair or bedside chair)?: Total ?Help needed to walk in hospital room?: Total ?Help needed climbing 3-5 steps with a railing? : Total ?6 Click Score: 9 ? ?  ?End of Session Equipment Utilized During Treatment: Gait belt ?Activity Tolerance: Patient tolerated treatment well ?Patient left: in bed;with bed alarm set;with call bell/phone within reach;with family/visitor present ?Nurse Communication: Mobility status ?PT Visit Diagnosis: Unsteadiness on feet (R26.81);Difficulty in walking, not elsewhere classified (R26.2);Muscle weakness (generalized) (M62.81) ?  ? ? ?Time: 1137-1207 ?PT Time Calculation (min) (ACUTE ONLY): 30 min ? ?Charges:  $Therapeutic Exercise: 8-22 mins ?$Therapeutic Activity: 8-22 mins          ?          ? ?Greggory Stallion, PT, DPT,  GCS ?[phone] ? ? ? ?[name] ?08/29/2021, 3:11 PM ? ?

## 2021-08-29 NOTE — Progress Notes (Signed)
[place] [facility] ? ? ?PATIENT NAME: Vanessa Esparza   ? ?MR#:  [id number] ? ?DATE OF BIRTH:  Jan 07, 1941 ? ?SUBJECTIVE:  ? ?Eating lunch. More alert and worked with therapy. ?Family at bedside ? ? ?VITALS:  ?Blood pressure 132/68, pulse 79, temperature 98 ?F (36.7 ?C), resp. rate 18, height '5\' 11"'$  (1.803 m), weight 77.1 kg, SpO2 95 %. ? ?PHYSICAL EXAMINATION:  ? ?GENERAL:  81 y.o.-year-old patient lying in the bed with no acute distress.  ?LUNGS: Normal breath sounds bilaterally, ?CARDIOVASCULAR: S1, S2 normal. No murmurs ?ABDOMEN: Soft, nontender, nondistended. Bowel sounds present.  ?EXTREMITIES: No  edema b/l.    ?NEUROLOGIC: nonfocal  patient is alert and awake ?SKIN: No obvious rash, lesion, or ulcer.  ? ?LABORATORY PANEL:  ?CBC ?Recent Labs  ?Lab 08/29/21 ?0618  ?WBC 6.2  ?HGB 11.1*  ?HCT 34.1*  ?PLT 303  ? ? ?Chemistries  ?Recent Labs  ?Lab 08/26/21 ?0022 08/26/21 ?1114 08/29/21 ?0618  ?NA 129*   < > 128*  ?K 3.1*   < > 3.7  ?CL 89*   < > 94*  ?CO2 28   < > 28  ?GLUCOSE 115*   < > 128*  ?BUN 20   < > 12  ?CREATININE 0.96   < > 0.52  ?CALCIUM 9.0   < > 8.5*  ?MG  --    < > 1.7  ?AST 27  --   --   ?ALT 19  --   --   ?ALKPHOS 53  --   --   ?BILITOT 0.9  --   --   ? < > = values in this interval not displayed.  ? ?Cardiac Enzymes ?No results for input(s): TROPONINI in the last 168 hours. ?RADIOLOGY:  ?EEG adult ? ?Result Date: 08/29/2021 ?Lora Havens, MD     08/29/2021  2:43 PM Patient Name: Vanessa Esparza MRN: [id number] Epilepsy Attending: Lora Havens Referring Physician/Provider: Kerney Elbe, MD Date: 08/29/2021 Duration: 22.18 mins Patient history: 81 y.o. female with a PMHx of seizures (none since 2010), HTN, DM, meningioma s/p resection, renal cell carcinoma s/p partial left nephrectomy who presented to the ED on Sunday after a possible fall at home. Neurology was called to evaluate for persistent AMS.  EEG to evaluate for seizure. Level of alertness: Awake,  asleep AEDs during EEG study: LEV Technical aspects: This EEG study was done with scalp electrodes positioned according to the 10-20 International system of electrode placement. Electrical activity was acquired at a sampling rate of '500Hz'$  and reviewed with a high frequency filter of '70Hz'$  and a low frequency filter of '1Hz'$ . EEG data were recorded continuously and digitally stored. Description: The posterior dominant rhythm consists of 8 Hz activity of moderate voltage (25-35 uV) seen predominantly in posterior head regions, symmetric and reactive to eye opening and eye closing. Sleep was characterized by vertex waves, sleep spindles (12 to 14 Hz), maximal frontocentral region. EEG showed continuous 3 to 6 Hz theta-delta slowing in right frontotemporal region.  Physiologic photic driving was not seen during photic stimulation. Hyperventilation was not performed.   ABNORMALITY - Continuous slow, right frontotemporal region IMPRESSION: This study is suggestive of cortical dysfunction arising from right frontotemporal region likely secondary to underlying structural abnormality/ meningioma. No seizures or epileptiform discharges were seen throughout the recording. Vanessa Esparza   ? ?Assessment and Plan ? ?Vanessa Esparza is a 81 y.o. female with medical history significant for hypertension, hypothyroidism,  meningioma, history of renal cell carcinoma status post left partial nephrectomy, diabetes mellitus who was brought into the ER by EMS for evaluation after she was found on the floor at home by her husband. ?  ? Acute metabolic encephalopathy ?Unclear etiology.  No signs of dehydration or infection. ?--MRI brain showed findings concerning for recurrence of meningioma, although unlikely to be the cause of pt's AMS.  ?--neuro consult with Dr Cheral Marker noted ?--EEG today ?--mentation much improved per son in the room ?  ?Hypokalemia ?Patient noted to have hypokalemia most likely related to HCTZ ?Supplement potassium  ?   ?Hyponatremia ?Appears chronic ?--d/c HCTZ ?  ?Diabetes mellitus type 2, uncomplicated (HCC) ?--no need for BG checks and SSI ?  ?Hypertension ?--Hold hydrochlorothiazide due to hyponatremia ?Place patient on lisinopril 20 mg daily ?  ?History of seizure ?--has not had a seizure since 2010 ?--Continue home Keppra ?--EEG ?  ?history of renal cell carcinoma ?Stable ?Status post left nephrectomy ?  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  Now, pt needs 2 people assist just to get up.  Unclear etiology.   ? ?--PT/OT rec SNF rehab--pt and family requests going home ?   ?History of meningioma ?--pt previously had her tumor resected in 2010 by Dr. Kathie Dike at [facility]. ?--current MRI brain showed likely recurrence which extends to the right sigmoid sinus and may be impacting venous drainage. ?--outpatient f/u with neurosurgery Dr. Izora Ribas (pt does not want to go back to [facility]). ?  ?Fall ?Patient was found on the floor at home and it is unclear what happened since it was unwitnessed ?--PT ?  ?Hypothyroidism (acquired) ?Stable ?Continue Synthroid ?  ? ? ? ?Procedures: EEG ?Family communication :son at bedside ?Consults : neurology, neurosurgery ?CODE STATUS: full ?DVT Prophylaxis : ?Level of care: Med-Surg ?Status is: Inpatient ?Remains inpatient appropriate because:  ?  ? ?TOTAL TIME TAKING CARE OF THIS PATIENT: 30 minutes.  ?>50% time spent on counselling and coordination of care ? ?Note: This dictation was prepared with Dragon dictation along with smaller phrase technology. Any transcriptional errors that result from this process are unintentional. ? ?Fritzi Mandes M.D  ? ? ?[facility]  ? ?CC: ?Primary care physician; [name], MD  ?

## 2021-08-29 NOTE — Progress Notes (Signed)
Eeg done 

## 2021-08-29 NOTE — Progress Notes (Signed)
Nutrition Follow-up ? ?DOCUMENTATION CODES:  ? ?Not applicable ? ?INTERVENTION:  ? ?-Continue Ensure Enlive po TID, each supplement provides 350 kcal and 20 grams of protein ?-Continue MVI with minerals daily ? ?NUTRITION DIAGNOSIS:  ? ?Inadequate oral intake related to decreased appetite as evidenced by meal completion < 50%, per patient/family report. ? ?Ongoing ? ?GOAL:  ? ?Patient will meet greater than or equal to 90% of their needs ? ?Progressing  ? ?MONITOR:  ? ?PO intake, Supplement acceptance, Labs, Weight trends, Skin, I & O's ? ?REASON FOR ASSESSMENT:  ? ?Malnutrition Screening Tool ?  ? ?ASSESSMENT:  ? ?Vanessa Esparza is a 81 y.o. female with medical history significant for hypertension, hypothyroidism, meningioma, GERD, history of renal cell carcinoma status post left partial nephrectomy, diabetes mellitus who was brought into the ER by EMS for evaluation after she was found on the floor at home by her husband. ? ?4/17- s/p BSE- advanced to regular diet with thin liquids  ? ?Reviewed I/O's: +395 ml x 24 hours and +351 ml since admission  ? ?Pt with improved oral intake. She consumed 95% of breakfast. Pt has been taking Ensure supplements.  ? ?Per TOC notes, pt son desires to take pt home with home health services once medically stable for discharge.  ? ?Medications reviewed and include calcium carbonate, folic acid, and keppra.  ? ?Labs reviewed: Na: 138, CBGS: 101-194 (inpatient orders for glycemic control are none).   ? ?Diet Order:   ?Diet Order   ? ?       ?  Diet regular Room service appropriate? Yes; Fluid consistency: Thin  Diet effective now       ?  ? ?  ?  ? ?  ? ? ?EDUCATION NEEDS:  ? ?Education needs have been addressed ? ?Skin:  Skin Assessment: Reviewed RN Assessment ? ?Last BM:  Unknown ? ?Height:  ? ?Ht Readings from Last 1 Encounters:  ?08/26/21 '5\' 11"'$  (1.803 m)  ? ? ?Weight:  ? ?Wt Readings from Last 1 Encounters:  ?08/26/21 77.1 kg  ? ? ?Ideal Body Weight:  70.5 kg ? ?BMI:  Body  mass index is 23.71 kg/m?. ? ?Estimated Nutritional Needs:  ? ?Kcal:  1700-1900 ? ?Protein:  90-105 grams ? ?Fluid:  > 1.7 L ? ? ? ?Loistine Chance, RD, LDN, CDCES ?Registered Dietitian II ?Certified Diabetes Care and Education Specialist ?Please refer to [facility] for RD and/or RD on-call/weekend/after hours pager  ?

## 2021-08-29 NOTE — Procedures (Addendum)
Patient Name: Vanessa Esparza  ?MRN: [id number]  ?Epilepsy Attending: Lora Havens  ?Referring Physician/Provider: Kerney Elbe, MD ?Date: 08/29/2021 ?Duration: 22.18 mins ? ?Patient history: 81 y.o. female with a PMHx of seizures (none since 2010), HTN, DM, meningioma s/p resection, renal cell carcinoma s/p partial left nephrectomy who presented to the ED on Sunday after a possible fall at home. Neurology was called to evaluate for persistent AMS.  EEG to evaluate for seizure. ? ?Level of alertness: Awake, asleep ? ?AEDs during EEG study: LEV ? ?Technical aspects: This EEG study was done with scalp electrodes positioned according to the 10-20 International system of electrode placement. Electrical activity was acquired at a sampling rate of '500Hz'$  and reviewed with a high frequency filter of '70Hz'$  and a low frequency filter of '1Hz'$ . EEG data were recorded continuously and digitally stored.  ? ?Description: The posterior dominant rhythm consists of 8 Hz activity of moderate voltage (25-35 uV) seen predominantly in posterior head regions, symmetric and reactive to eye opening and eye closing. Sleep was characterized by vertex waves, sleep spindles (12 to 14 Hz), maximal frontocentral region. EEG showed continuous 3 to 6 Hz theta-delta slowing in right frontotemporal region.  Physiologic photic driving was not seen during photic stimulation. Hyperventilation was not performed.    ? ?ABNORMALITY ?- Continuous slow, right frontotemporal region ? ?IMPRESSION: ?This study is suggestive of cortical dysfunction arising from right frontotemporal region likely secondary to underlying structural abnormality/ meningioma. No seizures or epileptiform discharges were seen throughout the recording. ? ?Lora Havens  ? ?

## 2021-08-30 DIAGNOSIS — G9341 Metabolic encephalopathy: Secondary | ICD-10-CM | POA: Diagnosis not present

## 2021-08-30 LAB — CBC
HCT: 32.7 % — ABNORMAL LOW (ref 36.0–46.0)
Hemoglobin: 10.8 g/dL — ABNORMAL LOW (ref 12.0–15.0)
MCH: 24.6 pg — ABNORMAL LOW (ref 26.0–34.0)
MCHC: 33 g/dL (ref 30.0–36.0)
MCV: 74.5 fL — ABNORMAL LOW (ref 80.0–100.0)
Platelets: 295 10*3/uL (ref 150–400)
RBC: 4.39 MIL/uL (ref 3.87–5.11)
RDW: 13.8 % (ref 11.5–15.5)
WBC: 4 10*3/uL (ref 4.0–10.5)
nRBC: 0 % (ref 0.0–0.2)

## 2021-08-30 LAB — BASIC METABOLIC PANEL
Anion gap: 5 (ref 5–15)
BUN: 12 mg/dL (ref 8–23)
CO2: 32 mmol/L (ref 22–32)
Calcium: 8.7 mg/dL — ABNORMAL LOW (ref 8.9–10.3)
Chloride: 95 mmol/L — ABNORMAL LOW (ref 98–111)
Creatinine, Ser: 0.65 mg/dL (ref 0.44–1.00)
GFR, Estimated: >60 mL/min (ref >60)
Glucose, Bld: 117 mg/dL — ABNORMAL HIGH (ref 70–99)
Potassium: 3.8 mmol/L (ref 3.5–5.1)
Sodium: 132 mmol/L — ABNORMAL LOW (ref 135–145)

## 2021-08-30 LAB — GLUCOSE, CAPILLARY: Glucose-Capillary: 116 mg/dL — ABNORMAL HIGH (ref 70–99)

## 2021-08-30 LAB — MAGNESIUM: Magnesium: 1.6 mg/dL — ABNORMAL LOW (ref 1.7–2.4)

## 2021-08-30 MED ORDER — ADULT MULTIVITAMIN W/MINERALS CH: 1.0000 | ORAL_TABLET | Freq: Every day | ORAL | 0 refills | Status: AC

## 2021-08-30 MED ORDER — LISINOPRIL 20 MG PO TABS: 20.0000 mg | ORAL_TABLET | Freq: Every day | ORAL | 2 refills | Status: DC

## 2021-08-30 MED ORDER — ENSURE ENLIVE PO LIQD: 237.0000 mL | Freq: Three times a day (TID) | ORAL | 12 refills | Status: DC

## 2021-08-30 MED ORDER — LEVETIRACETAM 500 MG PO TABS
500.0000 mg | ORAL_TABLET | Freq: Two times a day (BID) | ORAL | Status: DC
  Administered 2021-08-30: 500 mg via ORAL
  Filled 2021-08-30: qty 1

## 2021-08-30 MED ORDER — PANTOPRAZOLE SODIUM 40 MG PO TBEC
40.0000 mg | DELAYED_RELEASE_TABLET | Freq: Two times a day (BID) | ORAL | Status: DC
Start: 2021-08-30 — End: 2021-08-30
  Administered 2021-08-30: 40 mg via ORAL
  Filled 2021-08-30: qty 1

## 2021-08-30 NOTE — Progress Notes (Signed)
PHARMACIST - PHYSICIAN COMMUNICATION ? ?DR:   Posey Pronto ? ?CONCERNING: IV to Oral Route Change Policy ? ?RECOMMENDATION: ?This patient is receiving pantoprazol by the intravenous route.  Based on criteria approved by the Pharmacy and Therapeutics Committee, the intravenous medication(s) is/are being converted to the equivalent oral dose form(s). ? ? ?DESCRIPTION: ?These criteria include: ?The patient is eating (either orally or via tube) and/or has been taking other orally administered medications for a least 24 hours ?The patient has no evidence of active gastrointestinal bleeding or impaired GI absorption (gastrectomy, short bowel, patient on TNA or NPO). ? ?If you have questions about this conversion, please contact the Pharmacy Department  ?'[]'$   [phone] )  Vanessa Esparza ?'[x]'$   [phone] )  [facility] ?'[]'$   [phone] )  [facility] ?'[]'$   [phone] )  [facility] ?'[]'$   [phone] )  [facility]  ? ?[place], RPH ?08/30/2021 9:24 AM ? ?

## 2021-08-30 NOTE — Care Management Important Message (Signed)
Important Message ? ?Patient Details  ?Name: Vanessa Esparza ?MRN: [id number] ?Date of Birth: November 18, 1940 ? ? ?Medicare Important Message Given:  Yes ? ? ? ? ?Dannette Barbara ?08/30/2021, 2:49 PM ?

## 2021-08-30 NOTE — Discharge Summary (Signed)
?Physician Discharge Summary ?  ?Patient: Vanessa Esparza MRN: [id number] DOB: 1940-11-06  ?Admit date:     08/26/2021  ?Discharge date: 08/30/21  ?Discharge Physician: Fritzi Mandes  ? ?PCP: [name], MD  ? ?Recommendations at discharge:  ? ? F/u PCP Dr. Kary Kos in 7 to 10 days ?follow-up Dr. Izora Ribas neurosurgery in three months for repeat MRI for recurrent meningioma ? ?Discharge Diagnoses: ?acute metabolic encephalopathy improved unclear etiology suspect post ictal phase ?H/o seizure and recurrent meningioma (noted on MRI brian) ? ?Hospital Course: ? ?Vanessa Esparza is a 81 y.o. female with medical history significant for hypertension, hypothyroidism, meningioma, history of renal cell carcinoma status post left partial nephrectomy, diabetes mellitus who was brought into the ER by EMS for evaluation after she was found on the floor at home by her husband. ?  ? Acute metabolic encephalopathy ?Unclear etiology.  ?--per Neurology--suspect post ictal with h/o seizures ?--No signs of dehydration or infection. ?--MRI brain showed findings concerning for recurrence of meningioma, although unlikely to be the cause of pt's AMS.  ?--neuro consult with Dr Cheral Marker noted ?--EEG no seizure acitivity ?--mentation much improved per son in the room ?--f/u Neurosurgery dr Izora Ribas as out pt for repeat MRI brain in 3 months ?  ?Hypokalemia ?Patient noted to have hypokalemia most likely related to HCTZ ?Supplement potassium  ?  ?Hyponatremia ?Appears chronic ?--d/c HCTZ ?  ?Diabetes mellitus type 2, uncomplicated (HCC) ?--no need for BG checks and SSI ?  ?Hypertension ?--Hold hydrochlorothiazide due to hyponatremia ?Place patient on lisinopril 20 mg daily ?  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  ?history of renal cell carcinoma ?Stable ?Status post left nephrectomy ?  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  Now,  pt needs 2 people assist just to get up.  Unclear etiology.   ?  ?--PT/OT rec SNF rehab--pt and family requests going home ?   ?History of meningioma ?--pt previously had her tumor resected in 2010 by Dr. Kathie Dike at [facility]. ?--current MRI brain showed likely recurrence which extends to the right sigmoid sinus and may be impacting venous drainage. ?--outpatient f/u with neurosurgery Dr. Izora Ribas (pt does not want to go back to [facility]). ?  ?Fall ?Patient was found on the floor at home and it is unclear what happened since it was unwitnessed ?--PT--HHPT ?  ?Hypothyroidism (acquired) ?Continue Synthroid ?  ? d/c home--ok from neurology standpoint ?  ?  ?Procedures: EEG ?Family communication :son at bedside ?Consults : neurology, neurosurgery ?CODE STATUS: full ?DVT Prophylaxis : ?Level of care: Med-Surg ?Status is: Inpatient ? ?  ? ? ? ?Disposition: Home health ?Diet recommendation:  ?Discharge Diet Orders (From admission, onward)  ? ?  Start     Ordered  ? 08/30/21 0000  Diet - low sodium heart healthy       ? 08/30/21 1226  ? ?  ?  ? ?  ? ?Cardiac diet ?DISCHARGE MEDICATION: ?Allergies as of 08/30/2021   ? ?   Reactions  ? Arava [leflunomide]   ? Swelling tongue and ulcers in mouth   ? Methotrexate Derivatives Swelling  ? Tongue swelling  ? Statins Other (See Comments)  ? BLOATED FEELING, CHEST FULLNESS  ? ?  ? ?  ?Medication List  ?  ? ?STOP taking these medications   ? ?lisinopril-hydrochlorothiazide 20-25 MG tablet ?Commonly known as: ZESTORETIC ?  ?omeprazole 40  MG capsule ?Commonly known as: PRILOSEC ?  ?ondansetron 4 MG disintegrating tablet ?Commonly known as: ZOFRAN-ODT ?  ? ?  ? ?TAKE these medications   ? ?acetaminophen 325 MG tablet ?Commonly known as: TYLENOL ?Take 2 tablets (650 mg total) by mouth every 6 (six) hours as needed for mild pain (or Fever >/= 101). ?  ?allopurinol 100 MG tablet ?Commonly known as: ZYLOPRIM ?Take 100 mg by mouth daily. ?  ?calcium carbonate 600 MG Tabs tablet ?Commonly known as:  OS-CAL ?Take 600 mg by mouth daily. ?  ?clobetasol 0.05 % external solution ?Commonly known as: TEMOVATE ?Apply topically 2 (two) times daily. ?  ?feeding supplement Liqd ?Take 237 mLs by mouth 3 (three) times daily between meals. ?  ?fexofenadine 180 MG tablet ?Commonly known as: ALLEGRA ?Take 180 mg by mouth daily. ?  ?Fluocinolone Acetonide Scalp 0.01 % Oil ?Apply topically. ?  ?fluocinonide 0.05 % external solution ?Commonly known as: LIDEX ?Apply topically 2 (two) times daily as needed. ?  ?folic acid 937 MCG tablet ?Commonly known as: FOLVITE ?Take 800 mcg by mouth daily as needed (upset stomach). ?  ?glipiZIDE 5 MG 24 hr tablet ?Commonly known as: GLUCOTROL XL ?Take 5 mg by mouth daily. ?  ?glucose blood test strip ?USE 1 STRIP TO CHECK GLUCOSE TWICE DAILY ?  ?levETIRAcetam 500 MG tablet ?Commonly known as: KEPPRA ?Take 500 mg by mouth 2 (two) times daily. ?  ?levothyroxine 150 MCG tablet ?Commonly known as: SYNTHROID ?Take 150 mcg by mouth daily before breakfast. ?  ?lisinopril 20 MG tablet ?Commonly known as: ZESTRIL ?Take 1 tablet (20 mg total) by mouth daily. ?Start taking on: August 31, 2021 ?  ?mirabegron ER 50 MG Tb24 tablet ?Commonly known as: MYRBETRIQ ?Take 1 tablet (50 mg total) by mouth daily. ?  ?multivitamin with minerals Tabs tablet ?Take 1 tablet by mouth daily. ?Start taking on: August 31, 2021 ?  ?pantoprazole 40 MG tablet ?Commonly known as: PROTONIX ?Take 40 mg by mouth daily. ?  ?sucralfate 1 g tablet ?Commonly known as: CARAFATE ?Take 1 g by mouth 2 (two) times daily. ?  ? ?  ? ? Follow-up Information   ? ? [name], MD. Schedule an appointment as soon as possible for a visit.   ?Specialty: Family Medicine ?Why: in 7-10 days or your scheduled appt ?Contact information: ?[address] [facility] ?[place] [address] ?[phone] ? ? ?  ?  ? ? Meade Maw, MD Follow up in 3 month(s).   ?Specialty: Neurosurgery ?Why: for f/u MRI brain--recurrent meningioma ?Contact  information: ?[place] [address] ?[phone] ? ? ?  ?  ? ?  ?  ? ?  ? ?Discharge Exam: ?Vanessa Esparza Weights  ? 08/26/21 0012  ?Weight: 77.1 kg  ? ? ? ?Condition at discharge: fair ? ?The results of significant diagnostics from this hospitalization (including imaging, microbiology, ancillary and laboratory) are listed below for reference.  ? ?Imaging Studies: ?CT HEAD WO CONTRAST (5MM) ? ?Result Date: 08/26/2021 ?CLINICAL DATA:  Altered mental status. EXAM: CT HEAD WITHOUT CONTRAST TECHNIQUE: Contiguous axial images were obtained from the base of the skull through the vertex without intravenous contrast. RADIATION DOSE REDUCTION: This exam was performed according to the departmental dose-optimization program which includes automated exposure control, adjustment of the mA and/or kV according to patient size and/or use of iterative reconstruction technique. COMPARISON:  12/01/2020 FINDINGS: Brain: No evidence of acute infarction, hemorrhage, hydrocephalus, extra-axial collection or mass lesion/mass effect.  There is mild diffuse low-attenuation within the subcortical and periventricular white matter compatible with chronic microvascular disease. Prominence of the sulci and ventricles compatible with brain atrophy. Focal area of encephalomalacia is identified within the right temporal lobe. Vascular: No hyperdense vessel or unexpected calcification. Skull: Postoperative changes from remote right parietal and temporal craniotomy. Sinuses/Orbits: No acute abnormality Other: None IMPRESSION: 1. No acute intracranial abnormalities. 2. Chronic small vessel ischemic disease and brain atrophy. Electronically Signed   By: Kerby Moors M.D.   On: 08/26/2021 10:21  ? ?MR BRAIN WO CONTRAST ? ?Result Date: 08/26/2021 ?CLINICAL DATA:  Altered mental status. Possible fall. Patient was found on the floor. EXAM: MRI HEAD WITHOUT CONTRAST TECHNIQUE: Multiplanar, multiecho pulse sequences of the brain and surrounding  structures were obtained without intravenous contrast. COMPARISON:  None. FINDINGS: Brain: No acute traumatic injury. In postoperative changes again noted. Chronic encephalomalacia present over the lateral righ

## 2021-09-01 LAB — CULTURE, BLOOD (ROUTINE X 2)
Culture: NO GROWTH
Culture: NO GROWTH
Special Requests: ADEQUATE
Special Requests: ADEQUATE

## 2021-11-08 ENCOUNTER — Emergency Department (HOSPITAL_COMMUNITY): Payer: Medicare Other

## 2021-11-08 ENCOUNTER — Observation Stay (HOSPITAL_COMMUNITY): Payer: Medicare Other

## 2021-11-08 ENCOUNTER — Encounter (HOSPITAL_COMMUNITY): Payer: Self-pay | Admitting: Emergency Medicine

## 2021-11-08 ENCOUNTER — Inpatient Hospital Stay (HOSPITAL_COMMUNITY)
Admission: EM | Admit: 2021-11-08 | Discharge: 2021-11-11 | DRG: 101 | Disposition: A | Discharge status: Home Health | Payer: Medicare Other | Attending: Internal Medicine | Admitting: Internal Medicine

## 2021-11-08 ENCOUNTER — Other Ambulatory Visit: Payer: Self-pay

## 2021-11-08 DIAGNOSIS — R569 Unspecified convulsions: Principal | ICD-10-CM

## 2021-11-08 DIAGNOSIS — M069 Rheumatoid arthritis, unspecified: Secondary | ICD-10-CM | POA: Diagnosis present

## 2021-11-08 DIAGNOSIS — Z79890 Hormone replacement therapy (postmenopausal): Secondary | ICD-10-CM

## 2021-11-08 DIAGNOSIS — Z96651 Presence of right artificial knee joint: Secondary | ICD-10-CM | POA: Diagnosis present

## 2021-11-08 DIAGNOSIS — E119 Type 2 diabetes mellitus without complications: Secondary | ICD-10-CM | POA: Diagnosis present

## 2021-11-08 DIAGNOSIS — G40802 Other epilepsy, not intractable, without status epilepticus: Principal | ICD-10-CM | POA: Diagnosis present

## 2021-11-08 DIAGNOSIS — R2990 Unspecified symptoms and signs involving the nervous system: Secondary | ICD-10-CM | POA: Diagnosis present

## 2021-11-08 DIAGNOSIS — I639 Cerebral infarction, unspecified: Principal | ICD-10-CM

## 2021-11-08 DIAGNOSIS — K219 Gastro-esophageal reflux disease without esophagitis: Secondary | ICD-10-CM | POA: Diagnosis not present

## 2021-11-08 DIAGNOSIS — Z7984 Long term (current) use of oral hypoglycemic drugs: Secondary | ICD-10-CM

## 2021-11-08 DIAGNOSIS — Z79899 Other long term (current) drug therapy: Secondary | ICD-10-CM

## 2021-11-08 DIAGNOSIS — L409 Psoriasis, unspecified: Secondary | ICD-10-CM | POA: Diagnosis present

## 2021-11-08 DIAGNOSIS — Z888 Allergy status to other drugs, medicaments and biological substances status: Secondary | ICD-10-CM

## 2021-11-08 DIAGNOSIS — E039 Hypothyroidism, unspecified: Secondary | ICD-10-CM | POA: Diagnosis not present

## 2021-11-08 DIAGNOSIS — Z20822 Contact with and (suspected) exposure to covid-19: Secondary | ICD-10-CM | POA: Diagnosis present

## 2021-11-08 DIAGNOSIS — Z86011 Personal history of benign neoplasm of the brain: Secondary | ICD-10-CM

## 2021-11-08 DIAGNOSIS — E876 Hypokalemia: Secondary | ICD-10-CM | POA: Diagnosis not present

## 2021-11-08 DIAGNOSIS — E785 Hyperlipidemia, unspecified: Secondary | ICD-10-CM | POA: Diagnosis present

## 2021-11-08 DIAGNOSIS — G40909 Epilepsy, unspecified, not intractable, without status epilepticus: Secondary | ICD-10-CM

## 2021-11-08 DIAGNOSIS — N390 Urinary tract infection, site not specified: Secondary | ICD-10-CM | POA: Diagnosis present

## 2021-11-08 DIAGNOSIS — D329 Benign neoplasm of meninges, unspecified: Secondary | ICD-10-CM | POA: Diagnosis present

## 2021-11-08 DIAGNOSIS — M109 Gout, unspecified: Secondary | ICD-10-CM | POA: Diagnosis present

## 2021-11-08 DIAGNOSIS — Z85528 Personal history of other malignant neoplasm of kidney: Secondary | ICD-10-CM

## 2021-11-08 DIAGNOSIS — I10 Essential (primary) hypertension: Secondary | ICD-10-CM | POA: Diagnosis present

## 2021-11-08 DIAGNOSIS — Z905 Acquired absence of kidney: Secondary | ICD-10-CM

## 2021-11-08 DIAGNOSIS — Z90710 Acquired absence of both cervix and uterus: Secondary | ICD-10-CM

## 2021-11-08 DIAGNOSIS — D320 Benign neoplasm of cerebral meninges: Secondary | ICD-10-CM | POA: Diagnosis present

## 2021-11-08 DIAGNOSIS — M1990 Unspecified osteoarthritis, unspecified site: Secondary | ICD-10-CM | POA: Diagnosis present

## 2021-11-08 LAB — RAPID URINE DRUG SCREEN, HOSP PERFORMED
Amphetamines: NOT DETECTED
Barbiturates: NOT DETECTED
Benzodiazepines: NOT DETECTED
Cocaine: NOT DETECTED
Opiates: NOT DETECTED
Tetrahydrocannabinol: NOT DETECTED

## 2021-11-08 LAB — URINALYSIS, ROUTINE W REFLEX MICROSCOPIC
Bilirubin Urine: NEGATIVE
Glucose, UA: NEGATIVE mg/dL
Ketones, ur: 5 mg/dL — AB
Nitrite: NEGATIVE
Protein, ur: 30 mg/dL — AB
Specific Gravity, Urine: 1.025 (ref 1.005–1.030)
pH: 7 (ref 5.0–8.0)

## 2021-11-08 LAB — DIFFERENTIAL
Abs Immature Granulocytes: 0.02 10*3/uL (ref 0.00–0.07)
Basophils Absolute: 0 10*3/uL (ref 0.0–0.1)
Basophils Relative: 1 %
Eosinophils Absolute: 0 10*3/uL (ref 0.0–0.5)
Eosinophils Relative: 1 %
Immature Granulocytes: 1 %
Lymphocytes Relative: 21 %
Lymphs Abs: 0.9 10*3/uL (ref 0.7–4.0)
Monocytes Absolute: 0.3 10*3/uL (ref 0.1–1.0)
Monocytes Relative: 8 %
Neutro Abs: 2.9 10*3/uL (ref 1.7–7.7)
Neutrophils Relative %: 68 %

## 2021-11-08 LAB — I-STAT CHEM 8, ED
BUN: 14 mg/dL (ref 8–23)
Calcium, Ion: 1.16 mmol/L (ref 1.15–1.40)
Chloride: 96 mmol/L — ABNORMAL LOW (ref 98–111)
Creatinine, Ser: 0.9 mg/dL (ref 0.44–1.00)
Glucose, Bld: 90 mg/dL (ref 70–99)
HCT: 32 % — ABNORMAL LOW (ref 36.0–46.0)
Hemoglobin: 10.9 g/dL — ABNORMAL LOW (ref 12.0–15.0)
Potassium: 3.5 mmol/L (ref 3.5–5.1)
Sodium: 137 mmol/L (ref 135–145)
TCO2: 28 mmol/L (ref 22–32)

## 2021-11-08 LAB — COMPREHENSIVE METABOLIC PANEL
ALT: 21 U/L (ref 0–44)
AST: 27 U/L (ref 15–41)
Albumin: 3.5 g/dL (ref 3.5–5.0)
Alkaline Phosphatase: 47 U/L (ref 38–126)
Anion gap: 11 (ref 5–15)
BUN: 13 mg/dL (ref 8–23)
CO2: 29 mmol/L (ref 22–32)
Calcium: 9.2 mg/dL (ref 8.9–10.3)
Chloride: 99 mmol/L (ref 98–111)
Creatinine, Ser: 0.88 mg/dL (ref 0.44–1.00)
GFR, Estimated: >60 mL/min (ref >60)
Glucose, Bld: 97 mg/dL (ref 70–99)
Potassium: 3.7 mmol/L (ref 3.5–5.1)
Sodium: 139 mmol/L (ref 135–145)
Total Bilirubin: 0.8 mg/dL (ref 0.3–1.2)
Total Protein: 5.7 g/dL — ABNORMAL LOW (ref 6.5–8.1)

## 2021-11-08 LAB — HEMOGLOBIN A1C
Hgb A1c MFr Bld: 5.6 % (ref 4.8–5.6)
Mean Plasma Glucose: 114 mg/dL

## 2021-11-08 LAB — PROTIME-INR
INR: 1.1 (ref 0.8–1.2)
Prothrombin Time: 13.9 seconds (ref 11.4–15.2)

## 2021-11-08 LAB — CBC
HCT: 33 % — ABNORMAL LOW (ref 36.0–46.0)
HCT: 31.5 % — ABNORMAL LOW (ref 36.0–46.0)
Hemoglobin: 10.7 g/dL — ABNORMAL LOW (ref 12.0–15.0)
Hemoglobin: 10.2 g/dL — ABNORMAL LOW (ref 12.0–15.0)
MCH: 25 pg — ABNORMAL LOW (ref 26.0–34.0)
MCH: 24.7 pg — ABNORMAL LOW (ref 26.0–34.0)
MCHC: 32.4 g/dL (ref 30.0–36.0)
MCHC: 32.4 g/dL (ref 30.0–36.0)
MCV: 77.1 fL — ABNORMAL LOW (ref 80.0–100.0)
MCV: 76.3 fL — ABNORMAL LOW (ref 80.0–100.0)
Platelets: 251 10*3/uL (ref 150–400)
Platelets: 238 10*3/uL (ref 150–400)
RBC: 4.28 MIL/uL (ref 3.87–5.11)
RBC: 4.13 MIL/uL (ref 3.87–5.11)
RDW: 13.6 % (ref 11.5–15.5)
RDW: 13.7 % (ref 11.5–15.5)
WBC: 4.2 10*3/uL (ref 4.0–10.5)
WBC: 3.6 10*3/uL — ABNORMAL LOW (ref 4.0–10.5)
nRBC: 0 % (ref 0.0–0.2)
nRBC: 0 % (ref 0.0–0.2)

## 2021-11-08 LAB — ETHANOL: Alcohol, Ethyl (B): <10 mg/dL (ref <10)

## 2021-11-08 LAB — TSH: TSH: 2.526 u[IU]/mL (ref 0.350–4.500)

## 2021-11-08 LAB — CREATININE, SERUM
Creatinine, Ser: 0.79 mg/dL (ref 0.44–1.00)
GFR, Estimated: >60 mL/min (ref >60)

## 2021-11-08 LAB — RESP PANEL BY RT-PCR (FLU A&B, COVID) ARPGX2
Influenza A by PCR: NEGATIVE
Influenza B by PCR: NEGATIVE
SARS Coronavirus 2 by RT PCR: NEGATIVE

## 2021-11-08 LAB — APTT: aPTT: 33 seconds (ref 24–36)

## 2021-11-08 LAB — VITAMIN B12: Vitamin B-12: 693 pg/mL (ref 180–914)

## 2021-11-08 LAB — GLUCOSE, CAPILLARY: Glucose-Capillary: 141 mg/dL — ABNORMAL HIGH (ref 70–99)

## 2021-11-08 MED ORDER — MIRABEGRON ER 25 MG PO TB24
50.0000 mg | ORAL_TABLET | Freq: Every day | ORAL | Status: DC
  Administered 2021-11-08 – 2021-11-11 (×4): 50 mg via ORAL
  Filled 2021-11-08 (×4): qty 2

## 2021-11-08 MED ORDER — ACETAMINOPHEN 160 MG/5ML PO SOLN: 650.0000 mg | ORAL | Status: DC | PRN

## 2021-11-08 MED ORDER — LEVETIRACETAM IN NACL 500 MG/100ML IV SOLN
500.0000 mg | Freq: Two times a day (BID) | INTRAVENOUS | Status: DC
  Administered 2021-11-08: 500 mg via INTRAVENOUS
  Filled 2021-11-08 (×2): qty 100

## 2021-11-08 MED ORDER — ACETAMINOPHEN 650 MG RE SUPP: 650.0000 mg | RECTAL | Status: DC | PRN

## 2021-11-08 MED ORDER — ACETAMINOPHEN 325 MG PO TABS
650.0000 mg | ORAL_TABLET | ORAL | Status: DC | PRN
  Administered 2021-11-09 – 2021-11-10 (×2): 650 mg via ORAL
  Filled 2021-11-08 (×2): qty 2

## 2021-11-08 MED ORDER — INSULIN ASPART 100 UNIT/ML IJ SOLN: 0.0000 [IU] | Freq: Every day | INTRAMUSCULAR | Status: DC

## 2021-11-08 MED ORDER — ADULT MULTIVITAMIN W/MINERALS CH
1.0000 | ORAL_TABLET | Freq: Every day | ORAL | Status: DC
  Administered 2021-11-09 – 2021-11-11 (×3): 1 via ORAL
  Filled 2021-11-08 (×3): qty 1

## 2021-11-08 MED ORDER — PANTOPRAZOLE SODIUM 40 MG IV SOLR: 40.0000 mg | INTRAVENOUS | Status: DC

## 2021-11-08 MED ORDER — ALLOPURINOL 100 MG PO TABS
100.0000 mg | ORAL_TABLET | Freq: Every day | ORAL | Status: DC
  Administered 2021-11-08 – 2021-11-11 (×4): 100 mg via ORAL
  Filled 2021-11-08 (×4): qty 1

## 2021-11-08 MED ORDER — HEPARIN SODIUM (PORCINE) 5000 UNIT/ML IJ SOLN
5000.0000 [IU] | Freq: Three times a day (TID) | INTRAMUSCULAR | Status: DC
  Administered 2021-11-08 – 2021-11-11 (×8): 5000 [IU] via SUBCUTANEOUS
  Filled 2021-11-08 (×8): qty 1

## 2021-11-08 MED ORDER — IOHEXOL 350 MG/ML SOLN
75.0000 mL | Freq: Once | INTRAVENOUS | Status: AC | PRN
  Administered 2021-11-08: 75 mL via INTRAVENOUS

## 2021-11-08 MED ORDER — LEVETIRACETAM 500 MG PO TABS
500.0000 mg | ORAL_TABLET | Freq: Two times a day (BID) | ORAL | Status: DC
  Administered 2021-11-08 – 2021-11-09 (×2): 500 mg via ORAL
  Filled 2021-11-08 (×2): qty 1

## 2021-11-08 MED ORDER — INSULIN ASPART 100 UNIT/ML IJ SOLN
0.0000 [IU] | Freq: Three times a day (TID) | INTRAMUSCULAR | Status: DC
  Administered 2021-11-09: 2 [IU] via SUBCUTANEOUS
  Administered 2021-11-09 – 2021-11-10 (×2): 1 [IU] via SUBCUTANEOUS
  Administered 2021-11-10: 2 [IU] via SUBCUTANEOUS
  Administered 2021-11-11: 3 [IU] via SUBCUTANEOUS
  Administered 2021-11-11: 1 [IU] via SUBCUTANEOUS

## 2021-11-08 MED ORDER — SODIUM CHLORIDE 0.9 % IV SOLN: INTRAVENOUS | Status: AC

## 2021-11-08 MED ORDER — LISINOPRIL 20 MG PO TABS
20.0000 mg | ORAL_TABLET | Freq: Every day | ORAL | Status: DC
  Administered 2021-11-09 – 2021-11-11 (×3): 20 mg via ORAL
  Filled 2021-11-08 (×3): qty 1

## 2021-11-08 MED ORDER — GADOBUTROL 1 MMOL/ML IV SOLN
7.5000 mL | Freq: Once | INTRAVENOUS | Status: AC | PRN
  Administered 2021-11-08: 7.5 mL via INTRAVENOUS

## 2021-11-08 MED ORDER — LEVOTHYROXINE SODIUM 75 MCG PO TABS
150.0000 ug | ORAL_TABLET | Freq: Every day | ORAL | Status: DC
  Administered 2021-11-09 – 2021-11-11 (×3): 150 ug via ORAL
  Filled 2021-11-08 (×3): qty 2

## 2021-11-08 MED ORDER — IOHEXOL 350 MG/ML SOLN
100.0000 mL | Freq: Once | INTRAVENOUS | Status: AC | PRN
  Administered 2021-11-08: 100 mL via INTRAVENOUS

## 2021-11-08 MED ORDER — ONDANSETRON HCL 4 MG/2ML IJ SOLN
4.0000 mg | Freq: Four times a day (QID) | INTRAMUSCULAR | Status: DC | PRN
  Administered 2021-11-09 – 2021-11-11 (×5): 4 mg via INTRAVENOUS
  Filled 2021-11-08 (×5): qty 2

## 2021-11-08 MED ORDER — PANTOPRAZOLE SODIUM 40 MG PO TBEC
40.0000 mg | DELAYED_RELEASE_TABLET | Freq: Every day | ORAL | Status: DC
  Administered 2021-11-08 – 2021-11-11 (×4): 40 mg via ORAL
  Filled 2021-11-08 (×4): qty 1

## 2021-11-08 MED ORDER — ENSURE ENLIVE PO LIQD
237.0000 mL | Freq: Three times a day (TID) | ORAL | Status: DC
  Administered 2021-11-08 – 2021-11-11 (×9): 237 mL via ORAL

## 2021-11-08 MED ORDER — STROKE: EARLY STAGES OF RECOVERY BOOK: Freq: Once | Status: AC

## 2021-11-08 NOTE — Consult Note (Addendum)
NEURO HOSPITALIST CONSULT NOTE   Requestig physician: Dr. Alvino Chapel  Reason for Consult: Acute onset of right sided gaze preference and left sided weakness.   History obtained from:  EMS, Patient and Chart     HPI:                                                                                                                                          Vanessa Esparza is an 81 y.o. female with a PMHx of seizure (once in 2010 with none since, occurred a few months after her meningioma resection, on Keppra at home), HTN, DM, meningioma s/p resection, renal cell carcinoma s/p partial left nephrectomy who presents as a Code Stroke via EMS for acute onset of right sided gaze preference and left sided weakness.    She was admitted to [facility] in April for new onset of confusion. Son at that time stated that family had had concerns over the past few months about some memory issues that may have been indicative of initial stages of dementia. An MRI brain was obtained during the [facility] admission, which revealed findings concerning for recurrence of a right temporal meningioma. Neurosurgery had recommended serial imaging of the meningioma with outpatient Neurosurgery follow ups. Neurosurgery did not feel that the meningioma explained her presentation.   Her PMHx also includes RA, DM, seizure (once in 2010 with none since, occurred a few months after her meningioma resection, on Keppra at home), HTN, hypothyroidism, HLD and renal cell carcinoma s/p partial left nephrectomy.   Past Medical History:  Diagnosis Date   Anemia    Arthritis    Collagen vascular disease ([place])    Rhematoid Arthritis   Diabetes mellitus without complication (HCC)    GERD (gastroesophageal reflux disease)    Gout    Hyperlipidemia    Hypertension    Hypothyroidism    Meningioma ([place])    Psoriasis    Renal cell carcinoma, left ([place]) 05/2011   Left Partial Nephrectomy   Seizure ([place])    Seizures ([place])     LAST SEIZURE ON 02-08-09    Past Surgical History:  Procedure Laterality Date   ABDOMINAL HYSTERECTOMY     APPENDECTOMY     BACK SURGERY     LUMBAR   BRAIN MENINGIOMA EXCISION     BRAIN SURGERY     CRANIOTOMY WITH EXCISION OF SUPRATENTORIAL MENINGIOMA   COLONOSCOPY     COLONOSCOPY WITH PROPOFOL N/A 01/28/2017   Procedure: COLONOSCOPY WITH PROPOFOL;  Surgeon: Lollie Sails, MD;  Location: [facility] ENDOSCOPY;  Service: Endoscopy;  Laterality: N/A;   COLONOSCOPY WITH PROPOFOL N/A 01/01/2021   Procedure: COLONOSCOPY WITH PROPOFOL;  Surgeon: Annamaria Helling, DO;  Location: [facility] ENDOSCOPY;  Service: Gastroenterology;  Laterality: N/A;   CRANIOTOMY  w/ excision supratentorial meningioma   ESOPHAGOGASTRODUODENOSCOPY     ESOPHAGOGASTRODUODENOSCOPY (EGD) WITH PROPOFOL N/A 01/28/2017   Procedure: ESOPHAGOGASTRODUODENOSCOPY (EGD) WITH PROPOFOL;  Surgeon: Lollie Sails, MD;  Location: [facility] ENDOSCOPY;  Service: Endoscopy;  Laterality: N/A;   ESOPHAGOGASTRODUODENOSCOPY (EGD) WITH PROPOFOL N/A 01/01/2021   Procedure: ESOPHAGOGASTRODUODENOSCOPY (EGD) WITH PROPOFOL;  Surgeon: Annamaria Helling, DO;  Location: [facility] ENDOSCOPY;  Service: Gastroenterology;  Laterality: N/A;   HEMORRHOID SURGERY     KNEE ARTHROSCOPY Right 09/21/2015   Procedure: ARTHROSCOPY KNEE, LATERAL AND MEDIAL MENISECTOMY;  Surgeon: Hessie Knows, MD;  Location: [facility] ORS;  Service: Orthopedics;  Laterality: Right;   LAPAROSCOPIC BILATERAL SALPINGO OOPHERECTOMY Bilateral 04/12/2019   Procedure: LAPAROSCOPIC BILATERAL SALPINGO OOPHORECTOMY;  Surgeon: Ward, Honor Loh, MD;  Location: [facility] ORS;  Service: Gynecology;  Laterality: Bilateral;   PARTIAL NEPHRECTOMY Left    DUE TO RENAL CELL CARCINOMA   REDUCTION MAMMAPLASTY Bilateral    THYROID SURGERY     TOTAL KNEE ARTHROPLASTY Right 06/30/2018   Procedure: TOTAL KNEE ARTHROPLASTY-RIGHT;  Surgeon: Hessie Knows, MD;  Location: [facility] ORS;  Service: Orthopedics;  Laterality:  Right;    Family History  Problem Relation Age of Onset   Breast cancer Other    Breast cancer Mother               Social History:  reports that she has never smoked. She has never used smokeless tobacco. She reports that she does not drink alcohol and does not use drugs.  Allergies  Allergen Reactions   Arava [Leflunomide]     Swelling tongue and ulcers in mouth    Methotrexate Derivatives Swelling    Tongue swelling   Statins Other (See Comments)    BLOATED FEELING, CHEST FULLNESS    MEDICATIONS:                                                                                                                     No current facility-administered medications on file prior to encounter.   Current Outpatient Medications on File Prior to Encounter  Medication Sig Dispense Refill   acetaminophen (TYLENOL) 325 MG tablet Take 2 tablets (650 mg total) by mouth every 6 (six) hours as needed for mild pain (or Fever >/= 101).     allopurinol (ZYLOPRIM) 100 MG tablet Take 100 mg by mouth daily.      calcium carbonate (OS-CAL) 600 MG TABS tablet Take 600 mg by mouth daily.     clobetasol (TEMOVATE) 0.05 % external solution Apply topically 2 (two) times daily.     feeding supplement (ENSURE ENLIVE / ENSURE PLUS) LIQD Take 237 mLs by mouth 3 (three) times daily between meals. 237 mL 12   fexofenadine (ALLEGRA) 180 MG tablet Take 180 mg by mouth daily.     Fluocinolone Acetonide Scalp 0.01 % OIL Apply topically.     fluocinonide (LIDEX) 0.05 % external solution Apply topically 2 (two) times daily as needed.     folic acid (FOLVITE) 846 MCG tablet Take  800 mcg by mouth daily as needed (upset stomach).     glipiZIDE (GLUCOTROL XL) 5 MG 24 hr tablet Take 5 mg by mouth daily.     glucose blood test strip USE 1 STRIP TO CHECK GLUCOSE TWICE DAILY     levETIRAcetam (KEPPRA) 500 MG tablet Take 500 mg by mouth 2 (two) times daily.     levothyroxine (SYNTHROID, LEVOTHROID) 150 MCG tablet Take 150 mcg by  mouth daily before breakfast.     lisinopril (ZESTRIL) 20 MG tablet Take 1 tablet (20 mg total) by mouth daily. 30 tablet 2   mirabegron ER (MYRBETRIQ) 50 MG TB24 tablet Take 1 tablet (50 mg total) by mouth daily. 90 tablet 3   Multiple Vitamin (MULTIVITAMIN WITH MINERALS) TABS tablet Take 1 tablet by mouth daily. 30 tablet 0   pantoprazole (PROTONIX) 40 MG tablet Take 40 mg by mouth daily.     sucralfate (CARAFATE) 1 g tablet Take 1 g by mouth 2 (two) times daily.       ROS:                                                                                                                                       As per HPI. Does not endorse any additional symptoms.   BP (!) 183/67 (BP Location: Right Arm)   Pulse 88   Temp 97.6 F (36.4 C) (Oral)   Resp 14   Ht '5\' 11"'$  (1.803 m)   Wt 72.2 kg   SpO2 95%   BMI 22.20 kg/m   Weight 72 kg.   General Examination:                                                                                                       Physical Exam  HEENT-  Palpable evidence for prior craniotomy along scalp/skull near right ear.    Lungs- Respirations unlabored Extremities- No edema  Neurological Examination Mental Status: Awake with decreased level of alertness. Speech is subtly dysarthric but fluent with intact comprehension, naming and repetition. Not oriented to the day of the week, but is oriented to city, state, year and month. Mild left sided neglect.  Cranial Nerves: II: Left visual field cut. PERRL.   III,IV, VI: No ptosis. EOM are full. No nystagmus.  V: Temp sensation subjectively equal bilaterally VII: No definite facial asymmetry with smiling, grimacing and cheek-puff VIII: Hearing intact to voice IX,X: Phonation intact.  XI: Subtle lag on the left with shoulder  shrug XII: Subtle deviation to the left with tongue extension Motor: RUE 4+/5 proximally and distally LUE 4+/5 proximally and distally but with subtly decreased movement relative to  the right RLE 4+/5 proximally and distally LLE 4+/5 proximally and distally Positive for parietal drift on the left.  Sensory: Subjectively normal cool temp sensation to LUE, RUE, LLE and RLE. FT and scratch sensation decreased to LUE and LLE. Positive for left sided extinction to DSS.  Deep Tendon Reflexes: 1+ bilateral brachioradialis and left patellar. 0 right patellar in the context of prior knee replacement.  Plantars: Right: downgoing   Left: upgoing Cerebellar: No ataxia with FNF bilaterally  Gait: Deferred   Lab Results: Basic Metabolic Panel: Recent Labs  Lab 11/08/21 1357  NA 137  K 3.5  CL 96*  GLUCOSE 90  BUN 14  CREATININE 0.90    CBC: Recent Labs  Lab 11/08/21 1355 11/08/21 1357  WBC 4.2  --   NEUTROABS 2.9  --   HGB 10.7* 10.9*  HCT 33.0* 32.0*  MCV 77.1*  --   PLT 251  --     Cardiac Enzymes: No results for input(s): "CKTOTAL", "CKMB", "CKMBINDEX", "TROPONINI" in the last 168 hours.  Lipid Panel: No results for input(s): "CHOL", "TRIG", "HDL", "CHOLHDL", "VLDL", "LDLCALC" in the last 168 hours.  Imaging: No results found.  Prior MRI brain (08/26/21): Chronic encephalomalacia present over the lateral right temporal lobe. Progressive intra and extracranial soft tissue noted at the right temporal operative site, likely representing recurrent meningioma. The intracranial component measures 3.2 x 1.5 cm and communicates to an extracranial component measuring 3.6 x 1.2 cm. This corresponds to an area posterior and superior to the right ear. Probable tumor recurrence extends to the right sigmoid sinus and may be impacting venous drainage. Chronic right mastoid effusion.  Assessment: 81 year old female presenting with acute onset of left upper and lower extremity sensory deficit, mild dysarthria, mild left sided weakness and left visual field cut 1. Exam reveals some left sided inattention, left visual field cut, mild dysarthria, mild left sided weakness and  prominent left sided sensory deficit including extinction to DSS  2. CT head no acute intracranial pathology. Stable postsurgical changes reflecting right temporal craniotomy for mass resection with unchanged encephalomalacia in the underlying brain parenchyma. Recurrent meningioma underlying the craniotomy site invades and occludes the right sigmoid sinus with reconstitution of flow in the upper jugular vein.  3. CTP: No infarct core or penumbra identified on CT perfusion. 4. CTA of head and neck: Patent vasculature of the head and neck with no hemodynamically significant stenosis or occlusion. 5. TNK contraindicated due to time criteria   6. DDx for presentation includes stroke and unwitnessed or subclinical seizure with postictal state    Recommendations: 1. EEG 2. MRI brain with and without contrast 3. Continue home Keppra  Addendum: - MRI brain with and without contrast: No evidence of acute infarct. Findings compatible with recurrent intracranial and extracranial meningioma at the site of prior resection without osseous involvement, described above. Tumor abuts and probably invades the adjacent proximal sigmoid sinus and distal transverse dural venous sinuses with the sinus remaining patent distally and proximally. - Will obtain CTV of brain  Addendum:  - EEG: Continuous slow, right frontotemporal region. This study is suggestive of cortical dysfunction arising from right frontotemporal region likely secondary to underlying structural abnormality, post-ictal state. No seizures or epileptiform discharges were seen throughout the recording. - Given no acute stroke on brain MRI in  conjunction with right frontotemporal slowing on EEG, subclinical seizure is now highest on the DDx. Obtaining LTM EEG.   Electronically signed: Dr. Kerney Elbe 11/08/2021, 2:04 PM

## 2021-11-08 NOTE — Assessment & Plan Note (Addendum)
Continue PPI ?

## 2021-11-08 NOTE — ED Notes (Addendum)
Called 3W again to see if purple man could be initiated (bed assigned for 1 hour and 1 minute); again told by secretary Otilio Saber, the charge nurse is looking at pt chart

## 2021-11-08 NOTE — Assessment & Plan Note (Addendum)
Break through seizures.  LTM EEG with cortical dysfunction arising from the right fronto temporal region likely secondary to underlying structural abnormality, post ictal state. No seizures or epileptiform discharges.   Plan to continue at a increased dose of Keppra with good toleration.

## 2021-11-08 NOTE — Code Documentation (Signed)
Stroke Response Nurse Documentation Code Documentation  Vanessa Esparza is a 81 y.o. female arriving to [facility]  via [facility] on 11/08/21 with past medical hx of HLD, brain mass, HTN, DM, seizure. On No antithrombotic. Code stroke was activated by EMS.   Patient from home with husband where she was LKW at 0900 and now complaining of left sided weakness, AMS, right gaze. Per pt's son- pt was starting to become confused on Sunday but today around 0900 developed left-sided weakness and a right gaze. EMS was called.   Stroke team at the bedside on patient arrival. Labs drawn and patient cleared for CT by Dr. Alvino Chapel. Patient to CT with team. NIHSS 8, see documentation for details and code stroke times. Patient with right gaze preference , left hemianopia, left leg weakness, left limb ataxia, left decreased sensation, dysarthria , and Sensory  neglect on exam. The following imaging was completed:  CT Head, CTA, and CTP. Patient is not a candidate for IV Thrombolytic due to outside the window and a recent meningioma. Patient is not a candidate for IR due to no LVO.   Care Plan: Q2 vitals/neuro checks, MRI, EEG, admit to inpatient.   Bedside handoff with ED RN Hope and [place].    Fiorela Pelzer, Rande Brunt  Stroke Response RN

## 2021-11-08 NOTE — H&P (Signed)
History and Physical    Patient: Vanessa Esparza ATF:[id number] DOB: Jun 06, 1940 DOA: 11/08/2021 DOS: the patient was seen and examined on 11/08/2021 PCP: [name], MD  Patient coming from: Home  Chief Complaint:  Chief Complaint  Patient presents with   Code Stroke   HPI: Vanessa Esparza is a 81 y.o. female with medical history significant of hypertension, hyperlipidemia, gout, gastroesophageal flux disease, hypothyroidism, prior history of meningioma and seizure disorder; presented to the hospital secondary to new onset right-sided lateral gaze and left-sided weakness.  Patient was found not to be a candidate for tPA due to prior history of brain tumor with recent outside images suggesting recurrence.  Patient reports no chest pain, no nausea, no vomiting, no shortness of breath, no dysuria, no hematuria, no melena, no hematochezia or any other complaints. Expressed feeling slightly confused, on examination demonstrating some dysmetria (more accentuated on her left upper extremity), good muscular strength, no facial droop, no uvula or tongue deviation.  Especially at rest and when not engaged right lateral gaze appreciated.  CT scan without contrast demonstrating no acute hemorrhagic changes or intracranial abnormalities.  CT angio head and neck ruling out large vessel occlusion.  Neurology service has been consulted and [facility] contacted to place patient in the hospital for further evaluation and management.  While waiting in the ED per report patient passed swallowing evaluation.  Review of Systems: As mentioned in the history of present illness. All other systems reviewed and are negative. Past Medical History:  Diagnosis Date   Anemia    Arthritis    Collagen vascular disease ([place])    Rhematoid Arthritis   Diabetes mellitus without complication (HCC)    GERD (gastroesophageal reflux disease)    Gout    Hyperlipidemia    Hypertension    Hypothyroidism    Meningioma ([place])     Psoriasis    Renal cell carcinoma, left ([place]) 05/2011   Left Partial Nephrectomy   Seizure ([place])    Seizures ([place])    LAST SEIZURE ON 02-08-09   Past Surgical History:  Procedure Laterality Date   ABDOMINAL HYSTERECTOMY     APPENDECTOMY     BACK SURGERY     LUMBAR   BRAIN MENINGIOMA EXCISION     BRAIN SURGERY     CRANIOTOMY WITH EXCISION OF SUPRATENTORIAL MENINGIOMA   COLONOSCOPY     COLONOSCOPY WITH PROPOFOL N/A 01/28/2017   Procedure: COLONOSCOPY WITH PROPOFOL;  Surgeon: Lollie Sails, MD;  Location: [facility] ENDOSCOPY;  Service: Endoscopy;  Laterality: N/A;   COLONOSCOPY WITH PROPOFOL N/A 01/01/2021   Procedure: COLONOSCOPY WITH PROPOFOL;  Surgeon: Annamaria Helling, DO;  Location: [facility] ENDOSCOPY;  Service: Gastroenterology;  Laterality: N/A;   CRANIOTOMY     w/ excision supratentorial meningioma   ESOPHAGOGASTRODUODENOSCOPY     ESOPHAGOGASTRODUODENOSCOPY (EGD) WITH PROPOFOL N/A 01/28/2017   Procedure: ESOPHAGOGASTRODUODENOSCOPY (EGD) WITH PROPOFOL;  Surgeon: Lollie Sails, MD;  Location: [facility] ENDOSCOPY;  Service: Endoscopy;  Laterality: N/A;   ESOPHAGOGASTRODUODENOSCOPY (EGD) WITH PROPOFOL N/A 01/01/2021   Procedure: ESOPHAGOGASTRODUODENOSCOPY (EGD) WITH PROPOFOL;  Surgeon: Annamaria Helling, DO;  Location: [facility] ENDOSCOPY;  Service: Gastroenterology;  Laterality: N/A;   HEMORRHOID SURGERY     KNEE ARTHROSCOPY Right 09/21/2015   Procedure: ARTHROSCOPY KNEE, LATERAL AND MEDIAL MENISECTOMY;  Surgeon: Hessie Knows, MD;  Location: [facility] ORS;  Service: Orthopedics;  Laterality: Right;   LAPAROSCOPIC BILATERAL SALPINGO OOPHERECTOMY Bilateral 04/12/2019   Procedure: LAPAROSCOPIC BILATERAL SALPINGO OOPHORECTOMY;  Surgeon: Ward, Honor Loh, MD;  Location:  [facility] ORS;  Service: Gynecology;  Laterality: Bilateral;   PARTIAL NEPHRECTOMY Left    DUE TO RENAL CELL CARCINOMA   REDUCTION MAMMAPLASTY Bilateral    THYROID SURGERY     TOTAL KNEE ARTHROPLASTY Right 06/30/2018   Procedure:  TOTAL KNEE ARTHROPLASTY-RIGHT;  Surgeon: Hessie Knows, MD;  Location: [facility] ORS;  Service: Orthopedics;  Laterality: Right;   Social History:  reports that she has never smoked. She has never used smokeless tobacco. She reports that she does not drink alcohol and does not use drugs.  Allergies  Allergen Reactions   Arava [Leflunomide]     Swelling tongue and ulcers in mouth    Methotrexate Derivatives Swelling    Tongue swelling   Statins Other (See Comments)    BLOATED FEELING, CHEST FULLNESS    Family History  Problem Relation Age of Onset   Breast cancer Other    Breast cancer Mother     Prior to Admission medications   Medication Sig Start Date End Date Taking? Authorizing Provider  acetaminophen (TYLENOL) 325 MG tablet Take 2 tablets (650 mg total) by mouth every 6 (six) hours as needed for mild pain (or Fever >/= 101). 12/03/20   Domenic Polite, MD  allopurinol (ZYLOPRIM) 100 MG tablet Take 100 mg by mouth daily.     [provider]  calcium carbonate (OS-CAL) 600 MG TABS tablet Take 600 mg by mouth daily.    [provider]  clobetasol (TEMOVATE) 0.05 % external solution Apply topically 2 (two) times daily. 08/19/19   [provider]  feeding supplement (ENSURE ENLIVE / ENSURE PLUS) LIQD Take 237 mLs by mouth 3 (three) times daily between meals. 08/30/21   Fritzi Mandes, MD  fexofenadine (ALLEGRA) 180 MG tablet Take 180 mg by mouth daily.    [provider]  Fluocinolone Acetonide Scalp 0.01 % OIL Apply topically. 12/03/19   [provider]  fluocinonide (LIDEX) 0.05 % external solution Apply topically 2 (two) times daily as needed. 12/03/19   [provider]  folic acid (FOLVITE) 494 MCG tablet Take 800 mcg by mouth daily as needed (upset stomach).    [provider]  glipiZIDE (GLUCOTROL XL) 5 MG 24 hr tablet Take 5 mg by mouth daily. 09/07/20   [provider]  glucose blood test strip USE 1 STRIP TO CHECK  GLUCOSE TWICE DAILY 03/20/18   [provider]  levETIRAcetam (KEPPRA) 500 MG tablet Take 500 mg by mouth 2 (two) times daily.    [provider]  levothyroxine (SYNTHROID, LEVOTHROID) 150 MCG tablet Take 150 mcg by mouth daily before breakfast.    [provider]  lisinopril (ZESTRIL) 20 MG tablet Take 1 tablet (20 mg total) by mouth daily. 08/31/21   Fritzi Mandes, MD  mirabegron ER (MYRBETRIQ) 50 MG TB24 tablet Take 1 tablet (50 mg total) by mouth daily. 03/26/21   Stoioff, Ronda Fairly, MD  Multiple Vitamin (MULTIVITAMIN WITH MINERALS) TABS tablet Take 1 tablet by mouth daily. 08/31/21   Fritzi Mandes, MD  pantoprazole (PROTONIX) 40 MG tablet Take 40 mg by mouth daily.    [provider]  sucralfate (CARAFATE) 1 g tablet Take 1 g by mouth 2 (two) times daily.    [provider]    Physical Exam: Vitals:   11/08/21 1500 11/08/21 1515 11/08/21 1545 11/08/21 1600  BP: (!) 157/74 (!) 166/66 (!) 183/67 (!) 168/65  Pulse: 83 83 80 78  Resp: '18 16 18 19  '$ Temp:  TempSrc:      SpO2: 97% 95% 98% 95%  Weight:      Height:       General exam: Alert, awake, oriented x 2; disoriented to time; expressed having slight difficulty expressing herself.  No pronator drift.  Positive dysmetria more accentuated on her left side.  No chest pain, no fever, no nausea, no vomiting. Respiratory system: Clear to auscultation. Respiratory effort normal.  Good saturation on room air. Cardiovascular system:RRR. No rubs, gallops or JVD. Gastrointestinal system: Abdomen is nondistended, soft and nontender. No organomegaly or masses felt. Normal bowel sounds heard. Central nervous system: Right lateral gaze, positive dysmetria mainly on the left side (upper extremity); no pronator drift, no dysarthria, no facial droop and preserved muscular strength bilaterally and symmetrically. Extremities: No cyanosis, clubbing or edema. Skin: No petechiae. Psychiatry: Mood & affect appropriate.    Data Reviewed: Respiratory panel negative for COVID and influenza Ethanol level < 10 PT/INR   13.9/1.1 CBC White blood cells 4.2, hemoglobin 10.7 MCV 77.1, platelet count 251 Comprehensive metabolic panel demonstrating a sodium of 139, potassium 3.7, chloride 99, bicarb 29, BUN 13, platelet count 0.88; normal LFTs. UDS negative. Urinalysis with negative nitrite and moderate amount of leukocyte esterase.  Yellow in color and clear appearance.   Assessment and Plan: * Stroke-like symptoms - Risk factors include age, hypertension and hyperlipidemia -CT scan negative for acute intracranial abnormalities -After discussing with neurology service plan is for admission to complete a stroke work-up including EEG, MRI with and without contrast and 2D echo -CT angio head and neck not demonstrating large vessel occlusion or significant stenosis. -Allow for permissive hypertension while ruling out acute ischemia -Patient has past swallowing evaluation while in the ED per report; will allow for heart healthy diet and resume the use of statin. -given Weakness reported and slight confusion will check B12. level.  Essential hypertension - Heart healthy diet discussed with patient -Will resume the use of lisinopril on 11/09/2021. -Allowing permissive hypertension at this point.  Seizure ([place]) - Continue home Keppra dose -Per neurology recommendation we will check EEG.  Acquired hypothyroidism -Will check TSH -Continue Synthroid.  GERD (gastroesophageal reflux disease) - Continue PPI.  History of meningioma -Patient actively following by neurology and neurosurgery as an outpatient -Follow results of MRI with and without contrast.    Advance Care Planning:   Code Status: Full Code   Consults: Neurology service  Family Communication: No family at bedside.  Severity of Illness: The appropriate patient status for this patient is OBSERVATION. Observation status is judged to be reasonable  and necessary in order to provide the required intensity of service to ensure the patient's safety. The patient's presenting symptoms, physical exam findings, and initial radiographic and laboratory data in the context of their medical condition is felt to place them at decreased risk for further clinical deterioration. Furthermore, it is anticipated that the patient will be medically stable for discharge from the hospital within 2 midnights of admission.   Author: Barton Dubois, MD 11/08/2021 4:21 PM  For on call review [email or web address].

## 2021-11-08 NOTE — ED Notes (Signed)
Called 3W to see when purple man would be initiated (pt has had bed assigned for 37 minutes); per secretary, Otilio Saber, the charge nursing is looking at pt chart

## 2021-11-08 NOTE — Progress Notes (Signed)
EEG complete - results pending 

## 2021-11-08 NOTE — Assessment & Plan Note (Addendum)
Blood pressure control with lisinopril.

## 2021-11-08 NOTE — ED Triage Notes (Signed)
Pt BIB [facility] due weakness.  LKW 0900.  Pt was having confusion on Sunday 11/04/21. Hx of brain tumor & seizures.  VS BP 150/76, CBG 134, HR 70, SpO2 95% RA.

## 2021-11-08 NOTE — ED Notes (Signed)
ED TO INPATIENT HANDOFF REPORT  ED Nurse Name and Phone #:  Sariah Henkin [phone]  S Name/Age/Gender Vanessa Esparza 81 y.o. female Room/Bed: 010C/010C  Code Status   Code Status: Full Code  Home/SNF/Other Home Patient oriented to: self, place, time, and situation Is this baseline? Yes   Triage Complete: Triage complete  Chief Complaint Stroke-like symptoms [R29.90]  Triage Note Pt BIB [facility] due weakness.  LKW 0900.  Pt was having confusion on Sunday 11/04/21. Hx of brain tumor & seizures.  VS BP 150/76, CBG 134, HR 70, SpO2 95% RA.   Allergies Allergies  Allergen Reactions   Arava [Leflunomide]     Swelling tongue and ulcers in mouth    Methotrexate Derivatives Swelling    Tongue swelling   Statins Other (See Comments)    BLOATED FEELING, CHEST FULLNESS    Level of Care/Admitting Diagnosis ED Disposition     ED Disposition  Admit   Condition  --   Comment  Hospital Area: [facility] [100100]  Level of Care: Telemetry Medical [104]  May place patient in observation at [facility] or [place] if equivalent level of care is available:: No  Covid Evaluation: Asymptomatic - no recent exposure (last 10 days) testing not required  Diagnosis: Stroke-like symptoms [683419]  Admitting Physician: [place], [place]  Attending Physician: Barton Dubois [[id number]]          B Medical/Surgery History Past Medical History:  Diagnosis Date   Anemia    Arthritis    Collagen vascular disease ([place])    Rhematoid Arthritis   Diabetes mellitus without complication (HCC)    GERD (gastroesophageal reflux disease)    Gout    Hyperlipidemia    Hypertension    Hypothyroidism    Meningioma ([place])    Psoriasis    Renal cell carcinoma, left ([place]) 05/2011   Left Partial Nephrectomy   Seizure ([place])    Seizures ([place])    LAST SEIZURE ON 02-08-09   Past Surgical History:  Procedure Laterality Date   ABDOMINAL HYSTERECTOMY     APPENDECTOMY     BACK SURGERY      LUMBAR   BRAIN MENINGIOMA EXCISION     BRAIN SURGERY     CRANIOTOMY WITH EXCISION OF SUPRATENTORIAL MENINGIOMA   COLONOSCOPY     COLONOSCOPY WITH PROPOFOL N/A 01/28/2017   Procedure: COLONOSCOPY WITH PROPOFOL;  Surgeon: Lollie Sails, MD;  Location: [facility] ENDOSCOPY;  Service: Endoscopy;  Laterality: N/A;   COLONOSCOPY WITH PROPOFOL N/A 01/01/2021   Procedure: COLONOSCOPY WITH PROPOFOL;  Surgeon: Annamaria Helling, DO;  Location: [facility] ENDOSCOPY;  Service: Gastroenterology;  Laterality: N/A;   CRANIOTOMY     w/ excision supratentorial meningioma   ESOPHAGOGASTRODUODENOSCOPY     ESOPHAGOGASTRODUODENOSCOPY (EGD) WITH PROPOFOL N/A 01/28/2017   Procedure: ESOPHAGOGASTRODUODENOSCOPY (EGD) WITH PROPOFOL;  Surgeon: Lollie Sails, MD;  Location: [facility] ENDOSCOPY;  Service: Endoscopy;  Laterality: N/A;   ESOPHAGOGASTRODUODENOSCOPY (EGD) WITH PROPOFOL N/A 01/01/2021   Procedure: ESOPHAGOGASTRODUODENOSCOPY (EGD) WITH PROPOFOL;  Surgeon: Annamaria Helling, DO;  Location: [facility] ENDOSCOPY;  Service: Gastroenterology;  Laterality: N/A;   HEMORRHOID SURGERY     KNEE ARTHROSCOPY Right 09/21/2015   Procedure: ARTHROSCOPY KNEE, LATERAL AND MEDIAL MENISECTOMY;  Surgeon: Hessie Knows, MD;  Location: [facility] ORS;  Service: Orthopedics;  Laterality: Right;   LAPAROSCOPIC BILATERAL SALPINGO OOPHERECTOMY Bilateral 04/12/2019   Procedure: LAPAROSCOPIC BILATERAL SALPINGO OOPHORECTOMY;  Surgeon: Ward, Honor Loh, MD;  Location: [facility] ORS;  Service: Gynecology;  Laterality: Bilateral;  PARTIAL NEPHRECTOMY Left    DUE TO RENAL CELL CARCINOMA   REDUCTION MAMMAPLASTY Bilateral    THYROID SURGERY     TOTAL KNEE ARTHROPLASTY Right 06/30/2018   Procedure: TOTAL KNEE ARTHROPLASTY-RIGHT;  Surgeon: Hessie Knows, MD;  Location: [facility] ORS;  Service: Orthopedics;  Laterality: Right;     A IV Location/Drains/Wounds Patient Lines/Drains/Airways Status     Active Line/Drains/Airways     Name Placement date Placement  time Site Days   Peripheral IV 08/26/21 20 G Right Antecubital 08/26/21  0021  Antecubital  74   Peripheral IV 08/26/21 20 G Right Forearm 08/26/21  1450  Forearm  74   Peripheral IV 11/08/21 18 G Left Antecubital 11/08/21  1330  Antecubital  less than 1   External Urinary Catheter 12/01/20  2100  --  342   Incision (Closed) 09/21/15 Knee Right 09/21/15  1116  -- 2240   Incision (Closed) 06/30/18 Knee Right 06/30/18  0922  -- 1227   Incision (Closed) 04/12/19 Abdomen 04/12/19  1100  -- 941   Incision - 3 Ports Abdomen Right;Lateral Umbilicus Left;Lateral 82/80/03  0847  -- 941   Wound / Incision (Open or Dehisced) 12/01/20 Skin tear Elbow Left;Posterior 12/01/20  2130  Elbow  342            Intake/Output Last 24 hours No intake or output data in the 24 hours ending 11/08/21 1703  Labs/Imaging Results for orders placed or performed during the hospital encounter of 11/08/21 (from the past 48 hour(s))  Ethanol     Status: None   Collection Time: 11/08/21  1:55 PM  Result Value Ref Range   Alcohol, Ethyl (B) <10 <10 mg/dL    Comment: (NOTE) Lowest detectable limit for serum alcohol is 10 mg/dL.  For medical purposes only. Performed at [facility], [place] [address] [place], [place] [address]   Protime-INR     Status: None   Collection Time: 11/08/21  1:55 PM  Result Value Ref Range   Prothrombin Time 13.9 11.4 - 15.2 seconds   INR 1.1 0.8 - 1.2    Comment: (NOTE) INR goal varies based on device and disease states. Performed at [facility], [place] [address] [place], [place] [address]   APTT     Status: None   Collection Time: 11/08/21  1:55 PM  Result Value Ref Range   aPTT 33 24 - 36 seconds    Comment: Performed at [facility] [address] [place], [place] [address]  CBC     Status: Abnormal   Collection Time: 11/08/21  1:55 PM  Result Value Ref Range   WBC 4.2 4.0 - 10.5 K/uL   RBC 4.28 3.87 - 5.11 MIL/uL   Hemoglobin 10.7 (L) 12.0 - 15.0 g/dL    HCT 33.0 (L) 36.0 - 46.0 %   MCV 77.1 (L) 80.0 - 100.0 fL   MCH 25.0 (L) 26.0 - 34.0 pg   MCHC 32.4 30.0 - 36.0 g/dL   RDW 13.6 11.5 - 15.5 %   Platelets 251 150 - 400 K/uL   nRBC 0.0 0.0 - 0.2 %    Comment: Performed at [facility], [place] [address] [place], [place] [address]  Differential     Status: None   Collection Time: 11/08/21  1:55 PM  Result Value Ref Range   Neutrophils Relative % 68 %   Neutro Abs 2.9 1.7 - 7.7 K/uL   Lymphocytes Relative 21 %  Lymphs Abs 0.9 0.7 - 4.0 K/uL   Monocytes Relative 8 %   Monocytes Absolute 0.3 0.1 - 1.0 K/uL   Eosinophils Relative 1 %   Eosinophils Absolute 0.0 0.0 - 0.5 K/uL   Basophils Relative 1 %   Basophils Absolute 0.0 0.0 - 0.1 K/uL   Immature Granulocytes 1 %   Abs Immature Granulocytes 0.02 0.00 - 0.07 K/uL    Comment: Performed at [facility], [place] [address], [place], [place] [address]  Comprehensive metabolic panel     Status: Abnormal   Collection Time: 11/08/21  1:55 PM  Result Value Ref Range   Sodium 139 135 - 145 mmol/L   Potassium 3.7 3.5 - 5.1 mmol/L   Chloride 99 98 - 111 mmol/L   CO2 29 22 - 32 mmol/L   Glucose, Bld 97 70 - 99 mg/dL    Comment: Glucose reference range applies only to samples taken after fasting for at least 8 hours.   BUN 13 8 - 23 mg/dL   Creatinine, Ser 0.88 0.44 - 1.00 mg/dL   Calcium 9.2 8.9 - 10.3 mg/dL   Total Protein 5.7 (L) 6.5 - 8.1 g/dL   Albumin 3.5 3.5 - 5.0 g/dL   AST 27 15 - 41 U/L   ALT 21 0 - 44 U/L   Alkaline Phosphatase 47 38 - 126 U/L   Total Bilirubin 0.8 0.3 - 1.2 mg/dL   GFR, Estimated >60 >60 mL/min    Comment: (NOTE) Calculated using the CKD-EPI Creatinine Equation (2021)    Anion gap 11 5 - 15    Comment: Performed at [facility] [address], [place], [place] [address]  I-stat chem 8, ED     Status: Abnormal   Collection Time: 11/08/21  1:57 PM  Result Value Ref Range   Sodium 137 135 - 145 mmol/L   Potassium 3.5 3.5 - 5.1 mmol/L    Chloride 96 (L) 98 - 111 mmol/L   BUN 14 8 - 23 mg/dL   Creatinine, Ser 0.90 0.44 - 1.00 mg/dL   Glucose, Bld 90 70 - 99 mg/dL    Comment: Glucose reference range applies only to samples taken after fasting for at least 8 hours.   Calcium, Ion 1.16 1.15 - 1.40 mmol/L   TCO2 28 22 - 32 mmol/L   Hemoglobin 10.9 (L) 12.0 - 15.0 g/dL   HCT 32.0 (L) 36.0 - 46.0 %  Urinalysis, Routine w reflex microscopic     Status: Abnormal   Collection Time: 11/08/21  4:21 PM  Result Value Ref Range   Color, Urine YELLOW YELLOW   APPearance CLEAR CLEAR   Specific Gravity, Urine 1.025 1.005 - 1.030   pH 7.0 5.0 - 8.0   Glucose, UA NEGATIVE NEGATIVE mg/dL   Hgb urine dipstick SMALL (A) NEGATIVE   Bilirubin Urine NEGATIVE NEGATIVE   Ketones, ur 5 (A) NEGATIVE mg/dL   Protein, ur 30 (A) NEGATIVE mg/dL   Nitrite NEGATIVE NEGATIVE   Leukocytes,Ua MODERATE (A) NEGATIVE   RBC / HPF 0-5 0 - 5 RBC/hpf   WBC, UA 11-20 0 - 5 WBC/hpf   Bacteria, UA FEW (A) NONE SEEN   Squamous Epithelial / LPF 0-5 0 - 5   Non Squamous Epithelial 0-5 (A) NONE SEEN    Comment: Performed at [facility], [address] [place], [place] [address]  CBC     Status: Abnormal   Collection Time: 11/08/21  4:21 PM  Result Value Ref Range   WBC 3.6 (L) 4.0 - 10.5 K/uL   RBC 4.13 3.87 - 5.11 MIL/uL   Hemoglobin 10.2 (L) 12.0 - 15.0 g/dL   HCT 31.5 (L) 36.0 - 46.0 %   MCV 76.3 (L) 80.0 - 100.0 fL   MCH 24.7 (L) 26.0 - 34.0 pg   MCHC 32.4 30.0 - 36.0 g/dL   RDW 13.7 11.5 - 15.5 %   Platelets 238 150 - 400 K/uL   nRBC 0.0 0.0 - 0.2 %    Comment: Performed at [facility], [place] [address], [place], [place] [address]   CT HEAD CODE STROKE WO CONTRAST  Result Date: 11/08/2021 CLINICAL DATA:  Code stroke EXAM: CT ANGIOGRAPHY HEAD AND NECK CT PERFUSION BRAIN TECHNIQUE: Multidetector CT imaging of the head and neck was performed using the standard protocol during bolus administration of intravenous contrast. Multiplanar CT image  reconstructions and MIPs were obtained to evaluate the vascular anatomy. Carotid stenosis measurements (when applicable) are obtained utilizing NASCET criteria, using the distal internal carotid diameter as the denominator. Multiphase CT imaging of the brain was performed following IV bolus contrast injection. Subsequent parametric perfusion maps were calculated using RAPID software. RADIATION DOSE REDUCTION: This exam was performed according to the departmental dose-optimization program which includes automated exposure control, adjustment of the mA and/or kV according to patient size and/or use of iterative reconstruction technique. CONTRAST:  163m OMNIPAQUE IOHEXOL 350 MG/ML SOLN COMPARISON:  Brain MRI 08/27/2018 FINDINGS: CT HEAD FINDINGS Brain: There is no evidence of acute intracranial hemorrhage, extra-axial fluid collection, or acute infarct. Postsurgical changes reflecting right temporal craniotomy for mass resection are again seen. Previously described suspected recurrent intracranial tumor is not well assessed on this study but is seen along the roof of the mastoid air cells (5-43). Encephalomalacia in the underlying temporal lobe is similar to the prior study. The ventricles are stable in size. Patchy hypodensity in the subcortical and periventricular white matter likely reflecting background chronic white matter microangiopathy is unchanged. There is no mass effect or midline shift. Vascular: There is calcification of the bilateral cavernous ICAs. Skull: Postsurgical changes as above. Heterogeneous calcification along the inner and outer tables of the calvarium along the inferior aspect of the craniotomy site is similar going back to the study from 12/01/2020. There is no acute calvarial fracture. Sinuses/Orbits: There is mild mucosal thickening in the maxillary sinuses. The globes and orbits are unremarkable. Other: Fluid in the right mastoid air cells is unchanged. Soft tissue density overlying the  craniotomy site is unchanged. ASPECTS ([facility] - Ganglionic level infarction (caudate, lentiform nuclei, internal capsule, insula, M1-M3 cortex): 7 - Supraganglionic infarction (M4-M6 cortex): 3 Total score (0-10 with 10 being normal): 10 CTA NECK FINDINGS Aortic arch: There is calcified plaque in the imaged aortic arch the origins of the major branch vessels are patent. The subclavian arteries are patent to the level imaged. Right carotid system: The right common, internal, and external carotid arteries are patent with mild calcified plaque at the bifurcation but no hemodynamically significant stenosis or occlusion. There is no dissection or aneurysm. Left carotid system: The left common, internal, and external carotid arteries are patent, without hemodynamically significant stenosis or occlusion. There is no dissection or aneurysm. Vertebral arteries: The vertebral arteries are patent, without hemodynamically significant stenosis or occlusion. There is no dissection or aneurysm. Skeleton: There is multilevel degenerative change of the cervical spine. There is no acute osseous abnormality or suspicious  osseous lesion. There is no visible canal hematoma. Other neck: The thyroid is surgically absent. The soft tissues of the neck are otherwise unremarkable. Upper chest: The imaged lung apices are clear. Review of the MIP images confirms the above findings CTA HEAD FINDINGS Anterior circulation: There is calcified plaque in the intracranial ICAs without significant stenosis. The bilateral MCAs are patent without proximal high-grade stenosis or occlusion. The bilateral ACAs are patent. The anterior communicating artery is normal. There is no aneurysm or AVM. Posterior circulation: The bilateral V4 segments are patent with minimal calcified plaque on the right. PICA is seen on the left but not definitely identified on the right. The other major cerebellar artery origins are patent. The  basilar artery is patent. The bilateral PCAs are patent with mild atherosclerotic irregularity but no proximal high-grade stenosis or occlusion. There is no aneurysm or AVM. 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 There is reconstitution of flow in the upper jugular vein. Anatomic variants: None. Review of the MIP images confirms the above findings CT Brain Perfusion Findings: ASPECTS: 10 CBF (<30%) Volume: 766m Perfusion (Tmax>6.0s) volume: 083mMismatch Volume: 66m84mnfarction Location:N/a IMPRESSION: 1. No acute intracranial pathology.  ASPECTS is 10 2. No infarct core or penumbra identified on CT perfusion. 3. Patent vasculature of the head and neck with no hemodynamically significant stenosis or occlusion. 4. Stable postsurgical changes reflecting right temporal craniotomy for mass resection with unchanged encephalomalacia in the underlying brain parenchyma. Recurrent meningioma underlying the craniotomy site invades and occludes the right sigmoid sinus with reconstitution of flow in the upper jugular vein. MRI with contrast could better evaluate recurrent tumor. The results of the initial noncontrast head CT were paged via AMION at the time of interpretation on 11/08/2021 at 2:09 pm to provider Dr LinCheral MarkerTA/CTP results were paged at 2:22 p.m. Electronically Signed   By: PetValetta MoleD.   On: 11/08/2021 14:28   CT ANGIO HEAD NECK W WO CM W PERF (CODE STROKE)  Result Date: 11/08/2021 CLINICAL DATA:  Code stroke EXAM: CT ANGIOGRAPHY HEAD AND NECK CT PERFUSION BRAIN TECHNIQUE: Multidetector CT imaging of the head and neck was performed using the standard protocol during bolus administration of intravenous contrast. Multiplanar CT image reconstructions and MIPs were obtained to evaluate the vascular anatomy. Carotid stenosis measurements (when applicable) are obtained utilizing NASCET criteria, using the distal  internal carotid diameter as the denominator. Multiphase CT imaging of the brain was performed following IV bolus contrast injection. Subsequent parametric perfusion maps were calculated using RAPID software. RADIATION DOSE REDUCTION: This exam was performed according to the departmental dose-optimization program which includes automated exposure control, adjustment of the mA and/or kV according to patient size and/or use of iterative reconstruction technique. CONTRAST:  1066m67mNIPAQUE IOHEXOL 350 MG/ML SOLN COMPARISON:  Brain MRI 08/27/2018 FINDINGS: CT HEAD FINDINGS Brain: There is no evidence of acute intracranial hemorrhage, extra-axial fluid collection, or acute infarct. Postsurgical changes reflecting right temporal craniotomy for mass resection are again seen. Previously described suspected recurrent intracranial tumor is not well assessed on this study but is seen along the roof of the mastoid air cells (5-43). Encephalomalacia in the underlying temporal lobe is similar to the prior study. The ventricles are stable in size. Patchy hypodensity in the subcortical and periventricular white matter likely reflecting background chronic white matter microangiopathy is unchanged. There is no mass effect or midline shift. Vascular: There is calcification of the bilateral  cavernous ICAs. Skull: Postsurgical changes as above. Heterogeneous calcification along the inner and outer tables of the calvarium along the inferior aspect of the craniotomy site is similar going back to the study from 12/01/2020. There is no acute calvarial fracture. Sinuses/Orbits: There is mild mucosal thickening in the maxillary sinuses. The globes and orbits are unremarkable. Other: Fluid in the right mastoid air cells is unchanged. Soft tissue density overlying the craniotomy site is unchanged. ASPECTS [facility] Stroke Program Early CT Score) - Ganglionic level infarction (caudate, lentiform nuclei, internal capsule, insula, M1-M3 cortex): 7  - Supraganglionic infarction (M4-M6 cortex): 3 Total score (0-10 with 10 being normal): 10 CTA NECK FINDINGS Aortic arch: There is calcified plaque in the imaged aortic arch the origins of the major branch vessels are patent. The subclavian arteries are patent to the level imaged. Right carotid system: The right common, internal, and external carotid arteries are patent with mild calcified plaque at the bifurcation but no hemodynamically significant stenosis or occlusion. There is no dissection or aneurysm. Left carotid system: The left common, internal, and external carotid arteries are patent, without hemodynamically significant stenosis or occlusion. There is no dissection or aneurysm. Vertebral arteries: The vertebral arteries are patent, without hemodynamically significant stenosis or occlusion. There is no dissection or aneurysm. Skeleton: There is multilevel degenerative change of the cervical spine. There is no acute osseous abnormality or suspicious osseous lesion. There is no visible canal hematoma. Other neck: The thyroid is surgically absent. The soft tissues of the neck are otherwise unremarkable. Upper chest: The imaged lung apices are clear. Review of the MIP images confirms the above findings CTA HEAD FINDINGS Anterior circulation: There is calcified plaque in the intracranial ICAs without significant stenosis. The bilateral MCAs are patent without proximal high-grade stenosis or occlusion. The bilateral ACAs are patent. The anterior communicating artery is normal. There is no aneurysm or AVM. Posterior circulation: The bilateral V4 segments are patent with minimal calcified plaque on the right. PICA is seen on the left but not definitely identified on the right. The other major cerebellar artery origins are patent. The basilar artery is patent. The bilateral PCAs are patent with mild atherosclerotic irregularity but no proximal high-grade stenosis or occlusion. There is no aneurysm or AVM. Venous  sinuses: The recurrent meningioma underlying the right temporal craniotomy site invades and occludes the right sigmoid sinus. There is reconstitution of flow in the upper jugular vein. Anatomic variants: None. Review of the MIP images confirms the above findings CT Brain Perfusion Findings: ASPECTS: 10 CBF (<30%) Volume: 162m Perfusion (Tmax>6.0s) volume: 013mMismatch Volume: 62m43mnfarction Location:N/a IMPRESSION: 1. No acute intracranial pathology.  ASPECTS is 10 2. No infarct core or penumbra identified on CT perfusion. 3. Patent vasculature of the head and neck with no hemodynamically significant stenosis or occlusion. 4. Stable postsurgical changes reflecting right temporal craniotomy for mass resection with unchanged encephalomalacia in the underlying brain parenchyma. Recurrent meningioma underlying the craniotomy site invades and occludes the right sigmoid sinus with reconstitution of flow in the upper jugular vein. MRI with contrast could better evaluate recurrent tumor. The results of the initial noncontrast head CT were paged via AMION at the time of interpretation on 11/08/2021 at 2:09 pm to provider Dr LinCheral MarkerTA/CTP results were paged at 2:22 p.m. Electronically Signed   By: PetValetta MoleD.   On: 11/08/2021 14:28    Pending Labs Unresulted Labs (From admission, onward)     Start     Ordered   11/09/21  0500  Lipid panel  (Labs)  Tomorrow morning,   R       Comments: Fasting    11/08/21 1616   11/08/21 1649  TSH  Once,   R        11/08/21 1652   11/08/21 1649  Vitamin B12  Once,   R        11/08/21 1652   11/08/21 1611  Creatinine, serum  (heparin)  Once,   R       Comments: Baseline for heparin therapy IF NOT ALREADY DRAWN.    11/08/21 1616   11/08/21 1610  Hemoglobin A1c  (Labs)  Once,   R       Comments: To assess prior glycemic control    11/08/21 1616   11/08/21 1351  Resp Panel by RT-PCR (Flu A&B, Covid) Anterior Nasal Swab  (Asymptomatic - Covid)  Once,   URGENT         11/08/21 1350   11/08/21 1351  Urine rapid drug screen (hosp performed)  Once,   STAT        11/08/21 1350            Vitals/Pain Today's Vitals   11/08/21 1500 11/08/21 1515 11/08/21 1545 11/08/21 1600  BP: (!) 157/74 (!) 166/66 (!) 183/67 (!) 168/65  Pulse: 83 83 80 78  Resp: '18 16 18 19  '$ Temp:      TempSrc:      SpO2: 97% 95% 98% 95%  Weight:      Height:      PainSc:        Isolation Precautions No active isolations  Medications Medications   stroke: early stages of recovery book (has no administration in time range)  0.9 %  sodium chloride infusion (has no administration in time range)  acetaminophen (TYLENOL) tablet 650 mg (has no administration in time range)    Or  acetaminophen (TYLENOL) 160 MG/5ML solution 650 mg (has no administration in time range)    Or  acetaminophen (TYLENOL) suppository 650 mg (has no administration in time range)  heparin injection 5,000 Units (has no administration in time range)  ondansetron (ZOFRAN) injection 4 mg (has no administration in time range)  levETIRAcetam (KEPPRA) IVPB 500 mg/100 mL premix (has no administration in time range)  pantoprazole (PROTONIX) injection 40 mg (has no administration in time range)  insulin aspart (novoLOG) injection 0-5 Units (has no administration in time range)  insulin aspart (novoLOG) injection 0-9 Units (has no administration in time range)  iohexol (OMNIPAQUE) 350 MG/ML injection 100 mL (100 mLs Intravenous Contrast Given 11/08/21 1410)    Mobility  Low fall risk   Focused Assessments    R Recommendations: See Admitting Provider Note  Report given to:   Additional Notes:

## 2021-11-08 NOTE — Procedures (Signed)
Patient Name: Vanessa Esparza  MRN: [id number]  Epilepsy Attending: Lora Havens  Referring Physician/Provider: Kerney Elbe, MD  Date: 11/08/2021 Duration: 24.03 mins  Patient history: 81 year old female presenting with acute onset of left upper and lower extremity sensory deficit, mild dysarthria, mild left sided weakness and left visual field cut. EEG to evaluate for seizure.  Level of alertness: Awake  AEDs during EEG study: LEV  Technical aspects: This EEG study was done with scalp electrodes positioned according to the 10-20 International system of electrode placement. Electrical activity was acquired at a sampling rate of '500Hz'$  and reviewed with a high frequency filter of '70Hz'$  and a low frequency filter of '1Hz'$ . EEG data were recorded continuously and digitally stored.   Description: The posterior dominant rhythm consists of 9 Hz activity of moderate voltage (25-35 uV) seen predominantly in posterior head regions, symmetric and reactive to eye opening and eye closing. EEG showed continuous low amplitude 2-3 Hz delta slowing in right frontotemporal region. Hyperventilation and photic stimulation were not performed.     ABNORMALITY - Continuous slow, right frontotemporal region  IMPRESSION: This study is suggestive of cortical dysfunction arising from right frontotemporal region likely secondary to underlying structural abnormality, post-ictal state. No seizures or epileptiform discharges were seen throughout the recording.  If suspicion for ictal- interictal activity remains a concern, a prolonged study can be considered.   Jaya Lapka Barbra Sarks

## 2021-11-08 NOTE — Assessment & Plan Note (Addendum)
-  Risk factors include age, hypertension and hyperlipidemia -CT scan negative and MRI negative for acute intracranial abnormalities. -MRI demonstrating recurrence of meningioma on the right temporal side. -Given concern for sinus thrombosis CVT has been recommended by neurology service. -Some abnormal readings without specific epileptic forms appreciated on initial EEG; plan is for LMT EEG. -Continue aspirin, Lipitor and risk factor modification.- -Follow-up 2D echo results on telemetry. -B12 and TSH within normal limits.

## 2021-11-08 NOTE — ED Provider Notes (Signed)
[facility] EMERGENCY DEPARTMENT Provider Note   CSN: [id number] Arrival date & time: 11/08/21  1348  An emergency department physician performed an initial assessment on this suspected stroke patient at 1350.  History  Chief Complaint  Patient presents with   Code Stroke    Vanessa Esparza is a 81 y.o. female.  HPI Patient presented as a code stroke.  Reported left-sided weakness.  Positive record he had some confusion.  Somewhat difficulty getting history over the last normal but may have been 9:00 in the morning.  Reviewed notes and has meningioma that is recurrent after surgery.  Also potentially had seizure but the last seizure was years ago and is on Keppra.  Recent negative EEG.  Reportedly got up and was more confused.  Difficulty walking around.  On initial exam does have some left-sided weakness compared to left and does have left-sided neglect.    Home Medications Prior to Admission medications   Medication Sig Start Date End Date Taking? Authorizing Provider  acetaminophen (TYLENOL) 325 MG tablet Take 2 tablets (650 mg total) by mouth every 6 (six) hours as needed for mild pain (or Fever >/= 101). 12/03/20   Domenic Polite, MD  allopurinol (ZYLOPRIM) 100 MG tablet Take 100 mg by mouth daily.     [provider]  calcium carbonate (OS-CAL) 600 MG TABS tablet Take 600 mg by mouth daily.    [provider]  clobetasol (TEMOVATE) 0.05 % external solution Apply topically 2 (two) times daily. 08/19/19   [provider]  feeding supplement (ENSURE ENLIVE / ENSURE PLUS) LIQD Take 237 mLs by mouth 3 (three) times daily between meals. 08/30/21   Fritzi Mandes, MD  fexofenadine (ALLEGRA) 180 MG tablet Take 180 mg by mouth daily.    [provider]  Fluocinolone Acetonide Scalp 0.01 % OIL Apply topically. 12/03/19   [provider]  fluocinonide (LIDEX) 0.05 % external solution Apply topically 2 (two) times daily as needed.  12/03/19   [provider]  folic acid (FOLVITE) 086 MCG tablet Take 800 mcg by mouth daily as needed (upset stomach).    [provider]  glipiZIDE (GLUCOTROL XL) 5 MG 24 hr tablet Take 5 mg by mouth daily. 09/07/20   [provider]  glucose blood test strip USE 1 STRIP TO CHECK GLUCOSE TWICE DAILY 03/20/18   [provider]  levETIRAcetam (KEPPRA) 500 MG tablet Take 500 mg by mouth 2 (two) times daily.    [provider]  levothyroxine (SYNTHROID, LEVOTHROID) 150 MCG tablet Take 150 mcg by mouth daily before breakfast.    [provider]  lisinopril (ZESTRIL) 20 MG tablet Take 1 tablet (20 mg total) by mouth daily. 08/31/21   Fritzi Mandes, MD  mirabegron ER (MYRBETRIQ) 50 MG TB24 tablet Take 1 tablet (50 mg total) by mouth daily. 03/26/21   Stoioff, Ronda Fairly, MD  Multiple Vitamin (MULTIVITAMIN WITH MINERALS) TABS tablet Take 1 tablet by mouth daily. 08/31/21   Fritzi Mandes, MD  pantoprazole (PROTONIX) 40 MG tablet Take 40 mg by mouth daily.    [provider]  sucralfate (CARAFATE) 1 g tablet Take 1 g by mouth 2 (two) times daily.    [provider]      Allergies    Arava [leflunomide], Methotrexate derivatives, and Statins    Review of Systems   Review of Systems  Physical Exam Updated Vital Signs BP (!) 166/66   Pulse 83   Temp 97.6 F (  36.4 C) (Oral)   Resp 16   Ht '5\' 11"'$  (1.803 m)   Wt 72.2 kg   SpO2 95%   BMI 22.20 kg/m  Physical Exam Vitals and nursing note reviewed.  Cardiovascular:     Rate and Rhythm: Regular rhythm.  Pulmonary:     Breath sounds: No wheezing or rhonchi.  Abdominal:     Tenderness: There is no abdominal tenderness.  Neurological:     Mental Status: She is alert.   Does have some left-sided neglect.  No threat from the left.  Will cross midline however.  Decreased movement on left upper and lower extremity compared to right.  Good speech.  May have a small amount of confusion but is  still able to provide much history.  Complete NIH scoring done by neurology.  ED Results / Procedures / Treatments   Labs (all labs ordered are listed, but only abnormal results are displayed) Labs Reviewed  CBC - Abnormal; Notable for the following components:      Result Value   Hemoglobin 10.7 (*)    HCT 33.0 (*)    MCV 77.1 (*)    MCH 25.0 (*)    All other components within normal limits  COMPREHENSIVE METABOLIC PANEL - Abnormal; Notable for the following components:   Total Protein 5.7 (*)    All other components within normal limits  I-STAT CHEM 8, ED - Abnormal; Notable for the following components:   Chloride 96 (*)    Hemoglobin 10.9 (*)    HCT 32.0 (*)    All other components within normal limits  RESP PANEL BY RT-PCR (FLU A&B, COVID) ARPGX2  ETHANOL  PROTIME-INR  APTT  DIFFERENTIAL  RAPID URINE DRUG SCREEN, HOSP PERFORMED  URINALYSIS, ROUTINE W REFLEX MICROSCOPIC    EKG None  Radiology CT HEAD CODE STROKE WO CONTRAST  Result Date: 11/08/2021 CLINICAL DATA:  Code stroke EXAM: CT ANGIOGRAPHY HEAD AND NECK CT PERFUSION BRAIN TECHNIQUE: Multidetector CT imaging of the head and neck was performed using the standard protocol during bolus administration of intravenous contrast. Multiplanar CT image reconstructions and MIPs were obtained to evaluate the vascular anatomy. Carotid stenosis measurements (when applicable) are obtained utilizing NASCET criteria, using the distal internal carotid diameter as the denominator. Multiphase CT imaging of the brain was performed following IV bolus contrast injection. Subsequent parametric perfusion maps were calculated using RAPID software. RADIATION DOSE REDUCTION: This exam was performed according to the departmental dose-optimization program which includes automated exposure control, adjustment of the mA and/or kV according to patient size and/or use of iterative reconstruction technique. CONTRAST:  121m OMNIPAQUE IOHEXOL 350 MG/ML  SOLN COMPARISON:  Brain MRI 08/27/2018 FINDINGS: CT HEAD FINDINGS Brain: There is no evidence of acute intracranial hemorrhage, extra-axial fluid collection, or acute infarct. Postsurgical changes reflecting right temporal craniotomy for mass resection are again seen. Previously described suspected recurrent intracranial tumor is not well assessed on this study but is seen along the roof of the mastoid air cells (5-43). Encephalomalacia in the underlying temporal lobe is similar to the prior study. The ventricles are stable in size. Patchy hypodensity in the subcortical and periventricular white matter likely reflecting background chronic white matter microangiopathy is unchanged. There is no mass effect or midline shift. Vascular: There is calcification of the bilateral cavernous ICAs. Skull: Postsurgical changes as above. Heterogeneous calcification along the inner and outer tables of the calvarium along the inferior aspect of the craniotomy site is similar going back to the study from 12/01/2020.  There is no acute calvarial fracture. Sinuses/Orbits: There is mild mucosal thickening in the maxillary sinuses. The globes and orbits are unremarkable. Other: Fluid in the right mastoid air cells is unchanged. Soft tissue density overlying the craniotomy site is unchanged. ASPECTS [facility] Stroke Program Early CT Score) - Ganglionic level infarction (caudate, lentiform nuclei, internal capsule, insula, M1-M3 cortex): 7 - Supraganglionic infarction (M4-M6 cortex): 3 Total score (0-10 with 10 being normal): 10 CTA NECK FINDINGS Aortic arch: There is calcified plaque in the imaged aortic arch the origins of the major branch vessels are patent. The subclavian arteries are patent to the level imaged. Right carotid system: The right common, internal, and external carotid arteries are patent with mild calcified plaque at the bifurcation but no hemodynamically significant stenosis or occlusion. There is no dissection or  aneurysm. Left carotid system: The left common, internal, and external carotid arteries are patent, without hemodynamically significant stenosis or occlusion. There is no dissection or aneurysm. Vertebral arteries: The vertebral arteries are patent, without hemodynamically significant stenosis or occlusion. There is no dissection or aneurysm. Skeleton: There is multilevel degenerative change of the cervical spine. There is no acute osseous abnormality or suspicious osseous lesion. There is no visible canal hematoma. Other neck: The thyroid is surgically absent. The soft tissues of the neck are otherwise unremarkable. Upper chest: The imaged lung apices are clear. Review of the MIP images confirms the above findings CTA HEAD FINDINGS Anterior circulation: There is calcified plaque in the intracranial ICAs without significant stenosis. The bilateral MCAs are patent without proximal high-grade stenosis or occlusion. The bilateral ACAs are patent. The anterior communicating artery is normal. There is no aneurysm or AVM. Posterior circulation: The bilateral V4 segments are patent with minimal calcified plaque on the right. PICA is seen on the left but not definitely identified on the right. The other major cerebellar artery origins are patent. The basilar artery is patent. The bilateral PCAs are patent with mild atherosclerotic irregularity but no proximal high-grade stenosis or occlusion. There is no aneurysm or AVM. 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 There is reconstitution of flow in the upper jugular vein. Anatomic variants: None. Review of the MIP images confirms the above findings CT Brain Perfusion Findings: ASPECTS: 10 CBF (<30%) Volume: 15m Perfusion (Tmax>6.0s) volume: 065mMismatch Volume: 99m72mnfarction Location:N/a IMPRESSION: 1. No acute intracranial pathology.  ASPECTS is 10 2. No infarct core or penumbra  identified on CT perfusion. 3. Patent vasculature of the head and neck with no hemodynamically significant stenosis or occlusion. 4. Stable postsurgical changes reflecting right temporal craniotomy for mass resection with unchanged encephalomalacia in the underlying brain parenchyma. Recurrent meningioma underlying the craniotomy site invades and occludes the right sigmoid sinus with reconstitution of flow in the upper jugular vein. MRI with contrast could better evaluate recurrent tumor. The results of the initial noncontrast head CT were paged via AMION at the time of interpretation on 11/08/2021 at 2:09 pm to provider Dr LinCheral MarkerTA/CTP results were paged at 2:22 p.m. Electronically Signed   By: PetValetta MoleD.   On: 11/08/2021 14:28   CT ANGIO HEAD NECK W WO CM W PERF (CODE STROKE)  Result Date: 11/08/2021 CLINICAL DATA:  Code stroke EXAM: CT ANGIOGRAPHY HEAD AND NECK CT PERFUSION BRAIN TECHNIQUE: Multidetector CT imaging of the head and neck was performed using the standard protocol during bolus administration of intravenous contrast. Multiplanar CT image reconstructions and MIPs  were obtained to evaluate the vascular anatomy. Carotid stenosis measurements (when applicable) are obtained utilizing NASCET criteria, using the distal internal carotid diameter as the denominator. Multiphase CT imaging of the brain was performed following IV bolus contrast injection. Subsequent parametric perfusion maps were calculated using RAPID software. RADIATION DOSE REDUCTION: This exam was performed according to the departmental dose-optimization program which includes automated exposure control, adjustment of the mA and/or kV according to patient size and/or use of iterative reconstruction technique. CONTRAST:  1110m OMNIPAQUE IOHEXOL 350 MG/ML SOLN COMPARISON:  Brain MRI 08/27/2018 FINDINGS: CT HEAD FINDINGS Brain: There is no evidence of acute intracranial hemorrhage, extra-axial fluid collection, or acute infarct.  Postsurgical changes reflecting right temporal craniotomy for mass resection are again seen. Previously described suspected recurrent intracranial tumor is not well assessed on this study but is seen along the roof of the mastoid air cells (5-43). Encephalomalacia in the underlying temporal lobe is similar to the prior study. The ventricles are stable in size. Patchy hypodensity in the subcortical and periventricular white matter likely reflecting background chronic white matter microangiopathy is unchanged. There is no mass effect or midline shift. Vascular: There is calcification of the bilateral cavernous ICAs. Skull: Postsurgical changes as above. Heterogeneous calcification along the inner and outer tables of the calvarium along the inferior aspect of the craniotomy site is similar going back to the study from 12/01/2020. There is no acute calvarial fracture. Sinuses/Orbits: There is mild mucosal thickening in the maxillary sinuses. The globes and orbits are unremarkable. Other: Fluid in the right mastoid air cells is unchanged. Soft tissue density overlying the craniotomy site is unchanged. [facility] - Ganglionic level infarction (caudate, lentiform nuclei, internal capsule, insula, M1-M3 cortex): 7 - Supraganglionic infarction (M4-M6 cortex): 3 Total score (0-10 with 10 being normal): 10 CTA NECK FINDINGS Aortic arch: There is calcified plaque in the imaged aortic arch the origins of the major branch vessels are patent. The subclavian arteries are patent to the level imaged. Right carotid system: The right common, internal, and external carotid arteries are patent with mild calcified plaque at the bifurcation but no hemodynamically significant stenosis or occlusion. There is no dissection or aneurysm. Left carotid system: The left common, internal, and external carotid arteries are patent, without hemodynamically significant stenosis or occlusion. There is no dissection  or aneurysm. Vertebral arteries: The vertebral arteries are patent, without hemodynamically significant stenosis or occlusion. There is no dissection or aneurysm. Skeleton: There is multilevel degenerative change of the cervical spine. There is no acute osseous abnormality or suspicious osseous lesion. There is no visible canal hematoma. Other neck: The thyroid is surgically absent. The soft tissues of the neck are otherwise unremarkable. Upper chest: The imaged lung apices are clear. Review of the MIP images confirms the above findings CTA HEAD FINDINGS Anterior circulation: There is calcified plaque in the intracranial ICAs without significant stenosis. The bilateral MCAs are patent without proximal high-grade stenosis or occlusion. The bilateral ACAs are patent. The anterior communicating artery is normal. There is no aneurysm or AVM. Posterior circulation: The bilateral V4 segments are patent with minimal calcified plaque on the right. PICA is seen on the left but not definitely identified on the right. The other major cerebellar artery origins are patent. The basilar artery is patent. The bilateral PCAs are patent with mild atherosclerotic irregularity but no proximal high-grade stenosis or occlusion. There is no aneurysm or AVM. Venous sinuses: The recurrent meningioma underlying the right temporal craniotomy site  invades and occludes the right sigmoid sinus. There is reconstitution of flow in the upper jugular vein. Anatomic variants: None. Review of the MIP images confirms the above findings CT Brain Perfusion Findings: ASPECTS: 10 CBF (<30%) Volume: 7m Perfusion (Tmax>6.0s) volume: 05mMismatch Volume: 49m249mnfarction Location:N/a IMPRESSION: 1. No acute intracranial pathology.  ASPECTS is 10 2. No infarct core or penumbra identified on CT perfusion. 3. Patent vasculature of the head and neck with no hemodynamically significant stenosis or occlusion. 4. Stable postsurgical changes reflecting right temporal  craniotomy for mass resection with unchanged encephalomalacia in the underlying brain parenchyma. Recurrent meningioma underlying the craniotomy site invades and occludes the right sigmoid sinus with reconstitution of flow in the upper jugular vein. MRI with contrast could better evaluate recurrent tumor. The results of the initial noncontrast head CT were paged via AMION at the time of interpretation on 11/08/2021 at 2:09 pm to provider Dr LinCheral MarkerTA/CTP results were paged at 2:22 p.m. Electronically Signed   By: PetValetta MoleD.   On: 11/08/2021 14:28    Procedures Procedures    Medications Ordered in ED Medications  iohexol (OMNIPAQUE) 350 MG/ML injection 100 mL (100 mLs Intravenous Contrast Given 11/08/21 1410)    ED Course/ Medical Decision Making/ A&P                           Medical Decision Making Amount and/or Complexity of Data Reviewed Labs: ordered. Radiology: ordered.   Patient presented as a code stroke.  Neurologic deficits but with known meningioma not a candidate for tPA.  Also no large vessel occlusion.  CT scan reassuring with no bleeding.  CTA done and showed no definite stroke.  No perfusion deficit.  Seizure felt less likely but still potential.  His head remote seizures but recent EEGs were reassuring.  Discussed with Dr. LinCheral Markerd will need MRI and EEG but not a tPA candidate due to a several different reasons with the time of onset and intracranial mass.  Lab work reassuring without clear cause of symptoms.  Will discuss with admitting team.  CRITICAL CARE Performed by: NatDavonna Bellingtal critical care time: 30 minutes Critical care time was exclusive of separately billable procedures and treating other patients. Critical care was necessary to treat or prevent imminent or life-threatening deterioration. Critical care was time spent personally by me on the following activities: development of treatment plan with patient and/or surrogate as well as nursing,  discussions with consultants, evaluation of patient's response to treatment, examination of patient, obtaining history from patient or surrogate, ordering and performing treatments and interventions, ordering and review of laboratory studies, ordering and review of radiographic studies, pulse oximetry and re-evaluation of patient's condition.         Final Clinical Impression(s) / ED Diagnoses Final diagnoses:  Cerebrovascular accident (CVA), unspecified mechanism ([facility]  Rx / [place] Discharge Orders     None         PicDavonna BellingD 11/08/21 1542

## 2021-11-08 NOTE — Assessment & Plan Note (Addendum)
-   Continue Synthroid -TSH within normal limit.

## 2021-11-09 ENCOUNTER — Observation Stay (HOSPITAL_COMMUNITY): Payer: Medicare Other

## 2021-11-09 ENCOUNTER — Observation Stay (HOSPITAL_BASED_OUTPATIENT_CLINIC_OR_DEPARTMENT_OTHER): Payer: Medicare Other

## 2021-11-09 DIAGNOSIS — Z888 Allergy status to other drugs, medicaments and biological substances status: Secondary | ICD-10-CM | POA: Diagnosis not present

## 2021-11-09 DIAGNOSIS — Z20822 Contact with and (suspected) exposure to covid-19: Secondary | ICD-10-CM | POA: Diagnosis present

## 2021-11-09 DIAGNOSIS — E119 Type 2 diabetes mellitus without complications: Secondary | ICD-10-CM | POA: Diagnosis present

## 2021-11-09 DIAGNOSIS — G459 Transient cerebral ischemic attack, unspecified: Secondary | ICD-10-CM

## 2021-11-09 DIAGNOSIS — D329 Benign neoplasm of meninges, unspecified: Secondary | ICD-10-CM | POA: Diagnosis not present

## 2021-11-09 DIAGNOSIS — Z86011 Personal history of benign neoplasm of the brain: Secondary | ICD-10-CM | POA: Diagnosis not present

## 2021-11-09 DIAGNOSIS — M069 Rheumatoid arthritis, unspecified: Secondary | ICD-10-CM | POA: Diagnosis present

## 2021-11-09 DIAGNOSIS — Z85528 Personal history of other malignant neoplasm of kidney: Secondary | ICD-10-CM | POA: Diagnosis not present

## 2021-11-09 DIAGNOSIS — E785 Hyperlipidemia, unspecified: Secondary | ICD-10-CM | POA: Diagnosis present

## 2021-11-09 DIAGNOSIS — N390 Urinary tract infection, site not specified: Secondary | ICD-10-CM | POA: Diagnosis present

## 2021-11-09 DIAGNOSIS — Z96651 Presence of right artificial knee joint: Secondary | ICD-10-CM | POA: Diagnosis present

## 2021-11-09 DIAGNOSIS — R2990 Unspecified symptoms and signs involving the nervous system: Secondary | ICD-10-CM | POA: Diagnosis present

## 2021-11-09 DIAGNOSIS — I10 Essential (primary) hypertension: Secondary | ICD-10-CM | POA: Diagnosis not present

## 2021-11-09 DIAGNOSIS — Z90710 Acquired absence of both cervix and uterus: Secondary | ICD-10-CM | POA: Diagnosis not present

## 2021-11-09 DIAGNOSIS — E039 Hypothyroidism, unspecified: Secondary | ICD-10-CM | POA: Diagnosis not present

## 2021-11-09 DIAGNOSIS — Z79899 Other long term (current) drug therapy: Secondary | ICD-10-CM | POA: Diagnosis not present

## 2021-11-09 DIAGNOSIS — M1990 Unspecified osteoarthritis, unspecified site: Secondary | ICD-10-CM | POA: Diagnosis present

## 2021-11-09 DIAGNOSIS — L409 Psoriasis, unspecified: Secondary | ICD-10-CM | POA: Diagnosis present

## 2021-11-09 DIAGNOSIS — Z7984 Long term (current) use of oral hypoglycemic drugs: Secondary | ICD-10-CM | POA: Diagnosis not present

## 2021-11-09 DIAGNOSIS — E876 Hypokalemia: Secondary | ICD-10-CM | POA: Diagnosis not present

## 2021-11-09 DIAGNOSIS — K219 Gastro-esophageal reflux disease without esophagitis: Secondary | ICD-10-CM | POA: Diagnosis not present

## 2021-11-09 DIAGNOSIS — M109 Gout, unspecified: Secondary | ICD-10-CM | POA: Diagnosis present

## 2021-11-09 DIAGNOSIS — Z79890 Hormone replacement therapy (postmenopausal): Secondary | ICD-10-CM | POA: Diagnosis not present

## 2021-11-09 DIAGNOSIS — Z905 Acquired absence of kidney: Secondary | ICD-10-CM | POA: Diagnosis not present

## 2021-11-09 DIAGNOSIS — D320 Benign neoplasm of cerebral meninges: Secondary | ICD-10-CM | POA: Diagnosis present

## 2021-11-09 DIAGNOSIS — R569 Unspecified convulsions: Secondary | ICD-10-CM | POA: Diagnosis present

## 2021-11-09 LAB — GLUCOSE, CAPILLARY
Glucose-Capillary: 106 mg/dL — ABNORMAL HIGH (ref 70–99)
Glucose-Capillary: 152 mg/dL — ABNORMAL HIGH (ref 70–99)
Glucose-Capillary: 139 mg/dL — ABNORMAL HIGH (ref 70–99)
Glucose-Capillary: 121 mg/dL — ABNORMAL HIGH (ref 70–99)

## 2021-11-09 LAB — LIPID PANEL
Cholesterol: 201 mg/dL — ABNORMAL HIGH (ref 0–200)
HDL: 49 mg/dL (ref >40)
LDL Cholesterol: 128 mg/dL — ABNORMAL HIGH (ref 0–99)
Total CHOL/HDL Ratio: 4.1 RATIO
Triglycerides: 118 mg/dL (ref <150)
VLDL: 24 mg/dL (ref 0–40)

## 2021-11-09 LAB — ECHOCARDIOGRAM COMPLETE
Area-P 1/2: 3.72 cm2
Calc EF: 59 %
Height: 71 in
S' Lateral: 2.9 cm
Single Plane A2C EF: 60.8 %
Single Plane A4C EF: 57.8 %
Weight: 2547.2 oz

## 2021-11-09 MED ORDER — ATORVASTATIN CALCIUM 10 MG PO TABS
20.0000 mg | ORAL_TABLET | Freq: Every day | ORAL | Status: DC
  Administered 2021-11-09 – 2021-11-11 (×3): 20 mg via ORAL
  Filled 2021-11-09 (×3): qty 2

## 2021-11-09 MED ORDER — ORAL CARE MOUTH RINSE: 15.0000 mL | OROMUCOSAL | Status: DC | PRN

## 2021-11-09 MED ORDER — LEVETIRACETAM 750 MG PO TABS
750.0000 mg | ORAL_TABLET | Freq: Every day | ORAL | Status: DC
Start: 2021-11-10 — End: 2021-11-11
  Administered 2021-11-10: 750 mg via ORAL
  Filled 2021-11-09: qty 1

## 2021-11-09 MED ORDER — LEVETIRACETAM 500 MG PO TABS
500.0000 mg | ORAL_TABLET | Freq: Every morning | ORAL | Status: DC
  Administered 2021-11-10 – 2021-11-11 (×2): 500 mg via ORAL
  Filled 2021-11-09 (×2): qty 1

## 2021-11-09 NOTE — Progress Notes (Signed)
? ?  Inpatient Rehab Admissions Coordinator : ? ?Per therapy recommendations, patient was screened for CIR candidacy by Kevante Lunt RN MSN.  At this time patient appears to be a potential candidate for CIR. I will place a rehab consult per protocol for full assessment. Please call me with any questions. ? ?Adalaide Jaskolski RN MSN ?Admissions Coordinator ?[phone] ?  ?

## 2021-11-09 NOTE — Progress Notes (Signed)
Progress Note   Patient: Vanessa Esparza TLX:[id number] DOB: 1941/05/03 DOA: 11/08/2021     0 DOS: the patient was seen and examined on 11/09/2021   Brief hospital course: Vanessa Esparza is a 81 y.o. female with medical history significant of hypertension, hyperlipidemia, gout, gastroesophageal flux disease, hypothyroidism, prior history of meningioma and seizure disorder; presented to the hospital secondary to new onset right-sided lateral gaze and left-sided weakness.  Patient was found not to be a candidate for tPA due to prior history of brain tumor with recent outside images suggesting recurrence.  Patient reports no chest pain, no nausea, no vomiting, no shortness of breath, no dysuria, no hematuria, no melena, no hematochezia or any other complaints. Expressed feeling slightly confused, on examination demonstrating some dysmetria (more accentuated on her left upper extremity), good muscular strength, no facial droop, no uvula or tongue deviation.  Especially at rest and when not engaged right lateral gaze appreciated.   CT scan without contrast demonstrating no acute hemorrhagic changes or intracranial abnormalities.  CT angio head and neck ruling out large vessel occlusion.  Neurology service has been consulted and [facility] contacted to place patient in the hospital for further evaluation and management.  While waiting in the ED per report patient passed swallowing evaluation.  Assessment and Plan: * Stroke-like symptoms -Risk factors include age, hypertension and hyperlipidemia -CT scan negative and MRI negative for acute intracranial abnormalities. -MRI demonstrating recurrence of meningioma on the right temporal side. -Given concern for sinus thrombosis CVT has been recommended by neurology service. -Some abnormal readings without specific epileptic forms appreciated on initial EEG; plan is for LMT EEG. -Continue aspirin, Lipitor and risk factor modification.- -Follow-up 2D echo results  on telemetry. -B12 and TSH within normal limits.  Essential hypertension - Continue heart healthy/low-sodium diet. -Continue lisinopril -vital signs stable.   Seizure (Loop) -Continue Keppra -Follow neurology recommendations and follow EEG results.  Acquired hypothyroidism - Continue Synthroid -TSH within normal limit.  GERD (gastroesophageal reflux disease) -Continue PPI.  Physical deconditioning weakness -PT/OT evaluation pending. -We will follow recommendations.  Subjective:  Afebrile, no chest pain, no nausea, no vomiting.  Appears to be more interactive and expressing generalized weakness.  Physical Exam: Vitals:   11/08/21 2300 11/09/21 0300 11/09/21 0755 11/09/21 1512  BP: (!) 173/75 (!) 154/70 (!) 138/57 (!) 151/66  Pulse: 79 82 73 73  Resp: '18 16 18 18  '$ Temp: 98.4 F (36.9 C) 98.1 F (36.7 C) 98.3 F (36.8 C) 98 F (36.7 C)  TempSrc: Oral Oral    SpO2: 95% 96% 97% 95%  Weight:      Height:       General exam: Alert, awake, oriented x 3; more interactive; still having some dysmetria and generalized weakness.  No chest pain, no nausea, no vomiting. Respiratory system: Good air movement bilaterally; no wheezing or crackles.  Good saturation Cardiovascular system:RRR. No murmurs, rubs, gallops. Gastrointestinal system: Abdomen is nondistended, soft and nontender. No organomegaly or masses felt. Normal bowel sounds heard. Central nervous system: Generalized weakness; no focal motor deficits.  Positive dysmetria (left more than right). Extremities: No cyanosis or clubbing.  No edema. Skin: No petechiae. Psychiatry: Judgement and insight appear normal. Mood & affect appropriate.   Data Reviewed: LTM EEG in process Lipid panel demonstrating LDL 128, total cholesterol 201, HDL 49 and triglycerides 118. B12 693 TSH 2.526   Family Communication: No family at bedside.  Disposition: Status is: Inpatient Remains inpatient appropriate because: Completed  strokelike symptoms work-up/seizure  rule out. Also pending for PT/OT evaluation to assist with safe discharge disposition.   Planned Discharge Destination: to be determined.    DVT prophylaxis: Heparin  Author: Barton Dubois, MD 11/09/2021 5:22 PM  For on call review [email or web address].

## 2021-11-09 NOTE — Evaluation (Addendum)
Physical Therapy Evaluation Patient Details Name: Vanessa Esparza MRN: [id number] DOB: 25-Sep-1940 Today's Date: 11/09/2021  History of Present Illness  Pt is an 81 y.o. female who presented to the hospital 6/29 secondary to new onset right-sided lateral gaze and left-sided weakness. MRI revealed recurrent intracranial and extracranial meningioma at the site of prior resection.  PMH:  seizures, HTN, DM, meningioma s/p resection, renal cell carcinoma s/p partial left nephrectomy, RA, hypothyroidism   Clinical Impression  Pt admitted with above diagnosis. PTA pt lived at home with her husband, I/mod I mobility. Unsure of pt's reliability as a historian. She originally stated she was driving, grocery shopping, and the primary caregiver for her husband. Further into the session, pt stated her son has using a gait belt to help her get around. Per chart, pt with recent presentation to [facility] due to family concerns for increased confusion. Pt currently with functional limitations due to the deficits listed below (see PT Problem List). On eval, pt required min assist bed mobility, +2 min/mod assist sit to stand, and +2 HH/min assist sidestepping bedside. Mobility limited by pt undergoing LTM EEG. Pt noted to have L side weakness and L side inattention. Pt will benefit from skilled PT to increase their independence and safety with mobility to allow discharge to the venue listed below.          Recommendations for follow up therapy are one component of a multi-disciplinary discharge planning process, led by the attending physician.  Recommendations may be updated based on patient status, additional functional criteria and insurance authorization.  Follow Up Recommendations Acute inpatient rehab (3hours/day)      Assistance Recommended at Discharge Frequent or constant Supervision/Assistance  Patient can return home with the following  A lot of help with walking and/or transfers;A lot of help with  bathing/dressing/bathroom;Direct supervision/assist for medications management;Assist for transportation;Help with stairs or ramp for entrance    Equipment Recommendations None recommended by PT  Recommendations for Other Services  Rehab consult    Functional Status Assessment Patient has had a recent decline in their functional status and demonstrates the ability to make significant improvements in function in a reasonable and predictable amount of time.     Precautions / Restrictions Precautions Precautions: Fall Precaution Comments: Pt undergoing LTM EEG at time of eval.      Mobility  Bed Mobility Overal bed mobility: Needs Assistance Bed Mobility: Supine to Sit, Sit to Supine     Supine to sit: Min assist, HOB elevated     General bed mobility comments: +rail, increased time, assist to elevate trunk, assist with repositioning when returning to supine    Transfers Overall transfer level: Needs assistance Equipment used: 2 person hand held assist Transfers: Sit to/from Stand Sit to Stand: Min assist, Mod assist, +2 physical assistance           General transfer comment: initially mod assist but progressed to min    Ambulation/Gait Ambulation/Gait assistance: +2 physical assistance, Min assist   Assistive device: 2 person hand held assist         General Gait Details: sidestepping bedside  Stairs            Wheelchair Mobility    Modified Rankin (Stroke Patients Only)       Balance Overall balance assessment: Needs assistance Sitting-balance support: No upper extremity supported, Feet supported Sitting balance-Leahy Scale: Fair     Standing balance support: During functional activity, Bilateral upper extremity supported Standing balance-Leahy Scale: Poor Standing  balance comment: reliant on external support                             Pertinent Vitals/Pain Pain Assessment Pain Assessment: No/denies pain    Home Living  Family/patient expects to be discharged to:: Private residence Living Arrangements: Spouse/significant other Available Help at Discharge: Family;Available 24 hours/day Type of Home: House Home Access: Stairs to enter Entrance Stairs-Rails: Right Entrance Stairs-Number of Steps: 1-2   Home Layout: One level Home Equipment: Conservation officer, nature (2 wheels);Cane - single point;Shower seat - built in;Grab bars - tub/shower;Grab bars - toilet;BSC/3in1 Additional Comments: Toilet is elevated height with grab bar and arm rests    Prior Function Prior Level of Function : Independent/Modified Independent;Driving             Mobility Comments: reports at times using RW or cane for mobility ADLs Comments: independent ADLs, iADLs and driving. Pt reports main caregiver for husband. Pt also stated her son has been having to help her for the last 2 weeks.     Hand Dominance   Dominant Hand: Right    Extremity/Trunk Assessment   Upper Extremity Assessment Upper Extremity Assessment: Defer to OT evaluation    Lower Extremity Assessment Lower Extremity Assessment: RLE deficits/detail;LLE deficits/detail RLE Deficits / Details: grossly 4+/5 LLE Deficits / Details: grossly 3+/5, apraxic       Communication   Communication: No difficulties  Cognition Arousal/Alertness: Awake/alert Behavior During Therapy: Flat affect Overall Cognitive Status: Impaired/Different from baseline Area of Impairment: Memory, Problem solving, Following commands, Safety/judgement                     Memory: Decreased short-term memory Following Commands: Follows one step commands consistently Safety/Judgement: Decreased awareness of deficits   Problem Solving: Slow processing, Difficulty sequencing, Requires verbal cues          General Comments General comments (skin integrity, edema, etc.): Pt incontinent of urine upon standing.    Exercises     Assessment/Plan    PT Assessment Patient needs  continued PT services  PT Problem List Decreased strength;Decreased mobility;Decreased knowledge of precautions;Decreased activity tolerance;Decreased cognition;Decreased balance       PT Treatment Interventions Therapeutic activities;DME instruction;Gait training;Therapeutic exercise;Patient/family education;Balance training;Stair training;Functional mobility training;Neuromuscular re-education    PT Goals (Current goals can be found in the Care Plan section)  Acute Rehab PT Goals Patient Stated Goal: not stated PT Goal Formulation: With patient Time For Goal Achievement: 11/23/21 Potential to Achieve Goals: Good    Frequency Min 3X/week     Co-evaluation PT/OT/SLP Co-Evaluation/Treatment: Yes Reason for Co-Treatment: For patient/therapist safety;To address functional/ADL transfers PT goals addressed during session: Mobility/safety with mobility;Balance         AM-PAC PT "6 Clicks" Mobility  Outcome Measure Help needed turning from your back to your side while in a flat bed without using bedrails?: A Little Help needed moving from lying on your back to sitting on the side of a flat bed without using bedrails?: A Little Help needed moving to and from a bed to a chair (including a wheelchair)?: A Lot Help needed standing up from a chair using your arms (e.g., wheelchair or bedside chair)?: A Lot Help needed to walk in hospital room?: Total Help needed climbing 3-5 steps with a railing? : Total 6 Click Score: 12    End of Session Equipment Utilized During Treatment: Gait belt Activity Tolerance: Patient tolerated treatment well Patient  left: in bed;with call bell/phone within reach;with bed alarm set Nurse Communication: Mobility status PT Visit Diagnosis: Other abnormalities of gait and mobility (R26.89)    Time: 6440-3474 PT Time Calculation (min) (ACUTE ONLY): 30 min   Charges:   PT Evaluation $PT Eval Moderate Complexity: 1 Mod          Lorrin Goodell, PT   Office # [phone] Pager [phone]   Lorriane Shire 11/09/2021, 11:35 AM

## 2021-11-09 NOTE — Progress Notes (Signed)
Subjective: NO clinical seizures. Per son at bedside, she has periods of confusion at times very month and then takes few days to get back to baseline. This was the first time patient was neglecting his left side and had significant left side weakness.  ROS: negative except above  Examination  Vital signs in last 24 hours: Temp:  [97.6 F (36.4 C)-98.4 F (36.9 C)] 98.3 F (36.8 C) (06/30 0755) Pulse Rate:  [71-88] 73 (06/30 0755) Resp:  [14-20] 18 (06/30 0755) BP: (138-188)/(57-75) 138/57 (06/30 0755) SpO2:  [95 %-98 %] 97 % (06/30 0755) Weight:  [72 kg-72.2 kg] 72.2 kg (06/29 1421)  General: lying in bed, NAD  Neuro: MS: Alert, oriented, follows commands CN: pupils equal and reactive,  EOMI, flattening of left nasolabial  fold, tongue midline, normal sensation over face, Motor: 5/5 strength in RUE/RLE, 4/5 in LUE/LLE Coordination: normal Sensory: intact to light touch, no neglect Gait: not tested  Basic Metabolic Panel: Recent Labs  Lab 11/08/21 1355 11/08/21 1357 11/08/21 1621  NA 139 137  --   K 3.7 3.5  --   CL 99 96*  --   CO2 29  --   --   GLUCOSE 97 90  --   BUN 13 14  --   CREATININE 0.88 0.90 0.79  CALCIUM 9.2  --   --     CBC: Recent Labs  Lab 11/08/21 1355 11/08/21 1357 11/08/21 1621  WBC 4.2  --  3.6*  NEUTROABS 2.9  --   --   HGB 10.7* 10.9* 10.2*  HCT 33.0* 32.0* 31.5*  MCV 77.1*  --  76.3*  PLT 251  --  238     Coagulation Studies: Recent Labs    11/08/21 1355  LABPROT 13.9  INR 1.1    Imaging  CT head without contrast, CT angio head and neck with and without contrast 11/08/2021: No acute intracranial pathology. ASPECTS is 10. No infarct core or penumbra identified on CT perfusion. Patent vasculature of the head and neck with no hemodynamically significant stenosis or occlusion.Stable postsurgical changes reflecting right temporal craniotomy for mass resection with unchanged encephalomalacia in the underlying brain parenchyma.  Recurrent meningioma underlying the craniotomy site invades and occludes the right sigmoid sinus with reconstitution of flow in the upper jugular vein.   MRI brain with and without contrast 11/08/2021: Findings compatible with recurrent intracranial and extracranial meningioma at the site of prior resection without osseous involvement, described above. Tumor abuts and probably invades the adjacent proximal sigmoid sinus and distal transverse dural venous sinuses with the sinus remaining patent distally and proximally. No evidence of acute infarct.  CTV head 11/08/2021: The right sigmoid sinus is essentially indistinguishable from the patient's enhancing recurrent meningioma at the right temporal region, limiting assessment at this location. However, no filling defects to suggest intraluminal thrombus are seen proximal or distal to the mass. If there is persistent clinical reason to evaluate the dural sinus at this location, a follow-up examination with MRV, with and without contrast, is felt to likely be of greater utility. Otherwise negative intracranial MRV. No other evidence for dural sinus thrombosis.     ASSESSMENT AND PLAN: 81 year old female with history of right temporal meningioma status postresection and seizures who presented with left upper and lower extremity sensory deficit, mild dysarthria, mild left sided weakness and left visual field cut.   Breakthrough seizure Meningioma status postresection -LTM EEG showed cortical dysfunction arising from right frontotemporal region likely secondary to underlying structural abnormality.  Recommendations - Increase Keppra to '750mg'$  QHS, continue '500mg'$  AM -Continue LTM eeg overnight, can dc tomorrow if no seizures -Intranasal Valtoco 20 mg (10 mg in each nostril) to be used for seizure lasting more than 5 minutes.  Can repeat once after 4 hours.  Do not use more than 2 doses to treat one episode. -Recommend follow-up with Dr. Melrose Nakayama at [facility]  clinic and Dr Cari Caraway ( neurosurgery) ASAP to discuss further management - Discussed plan with Dr Dyann Kief and son at bedside   Seizure precautions: Per [facility] statutes, patients with seizures are not allowed to drive until they have been seizure-free for six months and cleared by a physician    Use caution when using heavy equipment or power tools. Avoid working on ladders or at heights. Take showers instead of baths. Ensure the water temperature is not too high on the home water heater. Do not go swimming alone. Do not lock yourself in a room alone (i.e. bathroom). When caring for infants or small children, sit down when holding, feeding, or changing them to minimize risk of injury to the child in the event you have a seizure. Maintain good sleep hygiene. Avoid alcohol.    If patient has another seizure, call 911 and bring them back to the ED if: A.  The seizure lasts longer than 5 minutes.      B.  The patient doesn't wake shortly after the seizure or has new problems such as difficulty seeing, speaking or moving following the seizure C.  The patient was injured during the seizure D.  The patient has a temperature over 102 F (39C) E.  The patient vomited during the seizure and now is having trouble breathing    During the Seizure   - First, ensure adequate ventilation and place patients on the floor on their left side  Loosen clothing around the neck and ensure the airway is patent. If the patient is clenching the teeth, do not force the mouth open with any object as this can cause severe damage - Remove all items from the surrounding that can be hazardous. The patient may be oblivious to what's happening and may not even know what he or she is doing. If the patient is confused and wandering, either gently guide him/her away and block access to outside areas - Reassure the individual and be comforting - Call 911. In most cases, the seizure ends before EMS arrives. However, there  are cases when seizures may last over 3 to 5 minutes. Or the individual may have developed breathing difficulties or severe injuries. If a pregnant patient or a person with diabetes develops a seizure, it is prudent to call an ambulance. - Finally, if the patient does not regain full consciousness, then call EMS. Most patients will remain confused for about 45 to 90 minutes after a seizure, so you must use judgment in calling for help. - Avoid restraints but make sure the patient is in a bed with padded side rails - Place the individual in a lateral position with the neck slightly flexed; this will help the saliva drain from the mouth and prevent the tongue from falling backward - Remove all nearby furniture and other hazards from the area - Provide verbal assurance as the individual is regaining consciousness - Provide the patient with privacy if possible - Call for help and start treatment as ordered by the caregiver    After the Seizure (Postictal Stage)   After a seizure, most patients  experience confusion, fatigue, muscle pain and/or a headache. Thus, one should permit the individual to sleep. For the next few days, reassurance is essential. Being calm and helping reorient the person is also of importance.   Most seizures are painless and end spontaneously. Seizures are not harmful to others but can lead to complications such as stress on the lungs, brain and the heart. Individuals with prior lung problems may develop labored breathing and respiratory distress.   I have spent a total of 40 minutes with the patient reviewing hospital notes,  test results, labs and examining the patient as well as establishing an assessment and plan that was discussed personally with the patient.  > 50% of time was spent in direct patient care.    Zeb Comfort Epilepsy [facility] For questions after 5pm please refer to AMION to reach the Neurologist on call

## 2021-11-09 NOTE — Procedures (Signed)
Patient Name: JANANI CHAMBER  MRN: [id number]  Epilepsy Attending: Lora Havens  Referring Physician/Provider: Kerney Elbe, MD  Duration: 11/08/2021 2334 to 11/09/2021 2334   Patient history: 81 year old female presenting with acute onset of left upper and lower extremity sensory deficit, mild dysarthria, mild left sided weakness and left visual field cut. EEG to evaluate for seizure.   Level of alertness: Awake, asleep   AEDs during EEG study: LEV   Technical aspects: This EEG study was done with scalp electrodes positioned according to the 10-20 International system of electrode placement. Electrical activity was acquired at a sampling rate of '500Hz'$  and reviewed with a high frequency filter of '70Hz'$  and a low frequency filter of '1Hz'$ . EEG data were recorded continuously and digitally stored.    Description: The posterior dominant rhythm consists of 9 Hz activity of moderate voltage (25-35 uV) seen predominantly in posterior head regions, symmetric and reactive to eye opening and eye closing.  Sleep was characterized by vertex waves, sleep spindles (12 to 14 Hz), maximal frontocentral region.  EEG showed continuous low amplitude 2-3 Hz delta slowing in right frontotemporal region. Hyperventilation and photic stimulation were not performed.      ABNORMALITY - Continuous slow, right frontotemporal region   IMPRESSION: This study is suggestive of cortical dysfunction arising from right frontotemporal region likely secondary to underlying structural abnormality, post-ictal state. No seizures or epileptiform discharges were seen throughout the recording.   Kateena Degroote Barbra Sarks

## 2021-11-09 NOTE — Progress Notes (Signed)
LTM EEG hooked up and running - no initial skin breakdown - push button tested - neuro notified. [facility] monitoring.  

## 2021-11-09 NOTE — Evaluation (Signed)
Occupational Therapy Evaluation Patient Details Name: Vanessa Esparza MRN: [id number] DOB: 22-Jan-1941 Today's Date: 11/09/2021   History of Present Illness Pt is an 81 y.o. female who presented to the hospital 6/29 secondary to new onset right-sided lateral gaze and left-sided weakness. MRI revealed recurrent intracranial and extracranial meningioma at the site of prior resection.  PMH:  seizures, HTN, DM, meningioma s/p resection, renal cell carcinoma s/p partial left nephrectomy, RA, hypothyroidism   Clinical Impression   Patient admitted for above and presents with problem list below, including impaired balance, weakness (L>R) with decreased coordination, impaired cognition and decreased activity tolerance. Pt initially reports independent and taking care of her spouse, but then during session reports her son has been helping her with a gait belt.  Questionable historian.  Pt currently undergoing LTM EEG, therefore session limited to EOB but requires mod to min +2 for sit to stand at EOB, min +2 for side stepping to [facility] and min-mod +2 for Adls.  Functionally, she follows simple commands with increased time but demonstrates difficulty with problem solving, awareness. Will follow acutely and recommend further OT services at AIR level after dc to optimize independence, safety and return to PLOF. Will follow.      Recommendations for follow up therapy are one component of a multi-disciplinary discharge planning process, led by the attending physician.  Recommendations may be updated based on patient status, additional functional criteria and insurance authorization.   Follow Up Recommendations  Acute inpatient rehab (3hours/day)    Assistance Recommended at Discharge Frequent or constant Supervision/Assistance  Patient can return home with the following A lot of help with walking and/or transfers;A lot of help with bathing/dressing/bathroom;Assistance with cooking/housework;Direct  supervision/assist for medications management;Direct supervision/assist for financial management;Assist for transportation;Help with stairs or ramp for entrance    Functional Status Assessment  Patient has had a recent decline in their functional status and demonstrates the ability to make significant improvements in function in a reasonable and predictable amount of time.  Equipment Recommendations  Other (comment) (defer)    Recommendations for Other Services Rehab consult     Precautions / Restrictions Precautions Precautions: Fall Precaution Comments: Pt undergoing LTM EEG at time of eval. Restrictions Weight Bearing Restrictions: No      Mobility Bed Mobility Overal bed mobility: Needs Assistance Bed Mobility: Supine to Sit, Sit to Supine     Supine to sit: Min assist, HOB elevated Sit to supine: Min assist, HOB elevated   General bed mobility comments: +rail, increased time, assist to elevate trunk, assist with repositioning when returning to supine    Transfers Overall transfer level: Needs assistance Equipment used: 2 person hand held assist Transfers: Sit to/from Stand Sit to Stand: Min assist, Mod assist, +2 physical assistance           General transfer comment: initially mod assist but progressed to min      Balance Overall balance assessment: Needs assistance Sitting-balance support: No upper extremity supported, Feet supported Sitting balance-Leahy Scale: Fair Sitting balance - Comments: cueing for forward lean, preference to posterior with static sitting.  Dyanimcally able to don socks with min guard   Standing balance support: Bilateral upper extremity supported, During functional activity Standing balance-Leahy Scale: Poor Standing balance comment: reliant on external support                           ADL either performed or assessed with clinical judgement   ADL Overall ADL's :  Needs assistance/impaired     Grooming: Minimal  assistance;Sitting   Upper Body Bathing: Minimal assistance;Sitting   Lower Body Bathing: Moderate assistance;Sit to/from stand;+2 for safety/equipment;+2 for physical assistance   Upper Body Dressing : Minimal assistance;Sitting   Lower Body Dressing: Moderate assistance;Sit to/from stand;+2 for safety/equipment;+2 for physical assistance   Toilet Transfer: Moderate assistance;Minimal assistance;+2 for safety/equipment;+2 for physical assistance Toilet Transfer Details (indicate cue type and reason): simulated side stepping to [facility] with B hand held support Toileting- Clothing Manipulation and Hygiene: Total assistance;+2 for physical assistance;+2 for safety/equipment Toileting - Clothing Manipulation Details (indicate cue type and reason): incontinent of bladder upon standing and requires total assist to complete hygiene     Functional mobility during ADLs: Moderate assistance;Minimal assistance;+2 for physical assistance;+2 for safety/equipment General ADL Comments: bil hand held support in standing     Vision Baseline Vision/History: 0 No visual deficits Ability to See in Adequate Light: 0 Adequate (reading glasses) Patient Visual Report: No change from baseline Additional Comments: not formally assessed, but pt scanning to B sides throughout sesssion- further assessment required     Perception Perception Comments: L inattention noted, continue assessment   Praxis      Pertinent Vitals/Pain Pain Assessment Pain Assessment: No/denies pain     Hand Dominance Right   Extremity/Trunk Assessment Upper Extremity Assessment Upper Extremity Assessment: LUE deficits/detail;Generalized weakness;RUE deficits/detail RUE Deficits / Details: grossly 4/5 MMT, decreased coordination and apraxic RUE Sensation: WNL RUE Coordination: decreased fine motor;decreased gross motor LUE Deficits / Details: grossly 3+/5 MMT, decreased coordination and apraxic LUE Sensation: WNL LUE  Coordination: decreased fine motor;decreased gross motor   Lower Extremity Assessment Lower Extremity Assessment: Defer to PT evaluation RLE Deficits / Details: grossly 4+/5 LLE Deficits / Details: grossly 3+/5, apraxic       Communication Communication Communication: No difficulties   Cognition Arousal/Alertness: Awake/alert Behavior During Therapy: Flat affect Overall Cognitive Status: Impaired/Different from baseline Area of Impairment: Memory, Problem solving, Following commands, Safety/judgement, Awareness                     Memory: Decreased short-term memory Following Commands: Follows one step commands consistently, Follows one step commands with increased time Safety/Judgement: Decreased awareness of safety, Decreased awareness of deficits Awareness: Emergent Problem Solving: Slow processing, Difficulty sequencing, Requires verbal cues General Comments: pt demonstrates decreased awareness to deficits, poor historian and reports her son has been assisting more for the last 2 weeks after voicing she was completely independent and driving.     General Comments  Pt incontinent of urine upon standing.    Exercises     Shoulder Instructions      Home Living Family/patient expects to be discharged to:: Private residence Living Arrangements: Spouse/significant other Available Help at Discharge: Family;Available 24 hours/day Type of Home: House Home Access: Stairs to enter [employer] of Steps: 1-2 Entrance Stairs-Rails: Right Home Layout: One level     Bathroom Shower/Tub: Tub/shower unit;Walk-in shower   Bathroom Toilet: Handicapped height     Home Equipment: Conservation officer, nature (2 wheels);Cane - single point;Shower seat - built in;Grab bars - tub/shower;Grab bars - toilet;BSC/3in1   Additional Comments: Toilet is elevated height with grab bar and arm rests      Prior Functioning/Environment Prior Level of Function : Independent/Modified  Independent;Driving;Patient poor historian/Family not available             Mobility Comments: reports at times using RW or cane for mobility ADLs Comments: independent ADLs, iADLs and driving.  Pt reports main caregiver for husband. Pt also stated her son has been having to help her for the last 2 weeks.        OT Problem List: Decreased strength;Decreased activity tolerance;Impaired balance (sitting and/or standing);Decreased coordination;Decreased cognition;Decreased safety awareness;Decreased knowledge of use of DME or AE;Decreased knowledge of precautions;Impaired UE functional use      OT Treatment/Interventions: Self-care/ADL training;Neuromuscular education;DME and/or AE instruction;Therapeutic activities;Cognitive remediation/compensation;Visual/perceptual remediation/compensation;Patient/family education;Balance training    OT Goals(Current goals can be found in the care plan section) Acute Rehab OT Goals Patient Stated Goal: none stated OT Goal Formulation: With patient Time For Goal Achievement: 11/23/21 Potential to Achieve Goals: Good  OT Frequency: Min 2X/week    Co-evaluation PT/OT/SLP Co-Evaluation/Treatment: Yes Reason for Co-Treatment: For patient/therapist safety;To address functional/ADL transfers PT goals addressed during session: Mobility/safety with mobility;Balance OT goals addressed during session: ADL's and self-care      AM-PAC OT "6 Clicks" Daily Activity     Outcome Measure Help from another person eating meals?: A Little Help from another person taking care of personal grooming?: A Little Help from another person toileting, which includes using toliet, bedpan, or urinal?: Total Help from another person bathing (including washing, rinsing, drying)?: A Lot Help from another person to put on and taking off regular upper body clothing?: A Little Help from another person to put on and taking off regular lower body clothing?: A Lot 6 Click Score: 14    End of Session Equipment Utilized During Treatment: Gait belt Nurse Communication: Mobility status  Activity Tolerance: Patient tolerated treatment well Patient left: in bed;with call bell/phone within reach;with bed alarm set  OT Visit Diagnosis: Other abnormalities of gait and mobility (R26.89);Muscle weakness (generalized) (M62.81);Other symptoms and signs involving the nervous system (R29.898)                Time: 4970-2637 OT Time Calculation (min): 29 min Charges:  OT General Charges $OT Visit: 1 Visit OT Evaluation $OT Eval Moderate Complexity: 1 Mod  Jolaine Artist, OT Acute Rehabilitation Services Office [phone]   Delight Stare 11/09/2021, 12:12 PM

## 2021-11-09 NOTE — Progress Notes (Signed)
MB maintenance FP1  and FP2 electrodes. No skin breakdown noted. All impedances below 10k ohms

## 2021-11-09 NOTE — Progress Notes (Signed)
1910- patient's son reported that patient had a blank stare across her face and then started to talk delusional. RN went into room. Patient was resting with eyes closed. RN performed neuro check, which was negative. Patient thought her spouse had visited that day which was inaccurate. Otherwise oriented.

## 2021-11-09 NOTE — Progress Notes (Signed)
  Echocardiogram 2D Echocardiogram has been performed.  Fidel Levy 11/09/2021, 8:40 AM

## 2021-11-10 DIAGNOSIS — D329 Benign neoplasm of meninges, unspecified: Secondary | ICD-10-CM | POA: Diagnosis not present

## 2021-11-10 DIAGNOSIS — K219 Gastro-esophageal reflux disease without esophagitis: Secondary | ICD-10-CM | POA: Diagnosis not present

## 2021-11-10 DIAGNOSIS — E039 Hypothyroidism, unspecified: Secondary | ICD-10-CM | POA: Diagnosis not present

## 2021-11-10 DIAGNOSIS — R569 Unspecified convulsions: Secondary | ICD-10-CM | POA: Diagnosis not present

## 2021-11-10 DIAGNOSIS — I10 Essential (primary) hypertension: Secondary | ICD-10-CM | POA: Diagnosis not present

## 2021-11-10 DIAGNOSIS — E876 Hypokalemia: Secondary | ICD-10-CM

## 2021-11-10 LAB — BASIC METABOLIC PANEL
Anion gap: 12 (ref 5–15)
BUN: 9 mg/dL (ref 8–23)
CO2: 27 mmol/L (ref 22–32)
Calcium: 9 mg/dL (ref 8.9–10.3)
Chloride: 104 mmol/L (ref 98–111)
Creatinine, Ser: 0.76 mg/dL (ref 0.44–1.00)
GFR, Estimated: >60 mL/min (ref >60)
Glucose, Bld: 113 mg/dL — ABNORMAL HIGH (ref 70–99)
Potassium: 3.2 mmol/L — ABNORMAL LOW (ref 3.5–5.1)
Sodium: 143 mmol/L (ref 135–145)

## 2021-11-10 LAB — GLUCOSE, CAPILLARY
Glucose-Capillary: 117 mg/dL — ABNORMAL HIGH (ref 70–99)
Glucose-Capillary: 193 mg/dL — ABNORMAL HIGH (ref 70–99)
Glucose-Capillary: 136 mg/dL — ABNORMAL HIGH (ref 70–99)

## 2021-11-10 MED ORDER — POTASSIUM CHLORIDE CRYS ER 20 MEQ PO TBCR
40.0000 meq | EXTENDED_RELEASE_TABLET | ORAL | Status: AC
  Administered 2021-11-10 (×2): 40 meq via ORAL
  Filled 2021-11-10 (×2): qty 2

## 2021-11-10 NOTE — Progress Notes (Signed)
Physical Therapy Treatment Patient Details Name: Vanessa Esparza MRN: [id number] DOB: 05-Aug-1940 Today's Date: 11/10/2021   History of Present Illness Pt is an 81 y.o. female who presented to the hospital 6/29 secondary to new onset right-sided lateral gaze and left-sided weakness. MRI revealed recurrent intracranial and extracranial meningioma at the site of prior resection.  PMH:  seizures, HTN, DM, meningioma s/p resection, renal cell carcinoma s/p partial left nephrectomy, RA, hypothyroidism    PT Comments    Pt with improved ambulation ability and tolerance however continues to demo L sided inattention, decreased insight deficits and safety, impaired processing, decreased insight to safety, and impaired comprehension. Spoke with son who reports he plans on hiring assist for 24/7 care at his parents home and desires to have HHPT instead of AIR. Son aware patient requires 24/7 assist at this time. Acute PT to cont to follow.    Recommendations for follow up therapy are one component of a multi-disciplinary discharge planning process, led by the attending physician.  Recommendations may be updated based on patient status, additional functional criteria and insurance authorization.  Follow Up Recommendations  Home health PT     Assistance Recommended at Discharge Frequent or constant Supervision/Assistance  Patient can return home with the following A lot of help with walking and/or transfers;A lot of help with bathing/dressing/bathroom;Direct supervision/assist for medications management;Assist for transportation;Help with stairs or ramp for entrance   Equipment Recommendations  None recommended by PT    Recommendations for Other Services Rehab consult     Precautions / Restrictions Precautions Precautions: Fall Restrictions Weight Bearing Restrictions: No     Mobility  Bed Mobility Overal bed mobility: Needs Assistance Bed Mobility: Supine to Sit     Supine to sit: Min  assist, HOB elevated     General bed mobility comments: +rail, increased time, assist to elevate trunk, max verbal cues to stay on task    Transfers Overall transfer level: Needs assistance Equipment used: Rolling walker (2 wheels) Transfers: Sit to/from Stand, Bed to chair/wheelchair/BSC Sit to Stand: Min assist   Step pivot transfers: Mod assist       General transfer comment: max directional verbal cues, pt initially with strong posterior lean bracing self on bed with back of legs, tactile cues for safe hand placement    Ambulation/Gait Ambulation/Gait assistance: Min assist, +2 safety/equipment Gait Distance (Feet): 120 Feet Assistive device: Rolling walker (2 wheels) Gait Pattern/deviations: Step-through pattern, Staggering left (vearing to L into obstacles) Gait velocity: pt kept vearing to the L and running into obstacles, verbal cues to navigate hallway back to here room Gait velocity interpretation: <1.31 ft/sec, indicative of household ambulator       Stairs             Wheelchair Mobility    Modified Rankin (Stroke Patients Only)       Balance Overall balance assessment: Needs assistance Sitting-balance support: No upper extremity supported, Feet supported Sitting balance-Leahy Scale: Fair Sitting balance - Comments: cueing for forward lean, preference to posterior with static sitting.  Dyanimcally able to don socks with min guard   Standing balance support: Bilateral upper extremity supported, During functional activity Standing balance-Leahy Scale: Poor Standing balance comment: reliant on external support                            Cognition Arousal/Alertness: Awake/alert Behavior During Therapy: Impulsive Overall Cognitive Status: Impaired/Different from baseline Area of Impairment: Memory, Problem solving,  Following commands, Safety/judgement, Awareness                     Memory: Decreased short-term memory Following  Commands: Follows one step commands with increased time, Follows multi-step commands inconsistently Safety/Judgement: Decreased awareness of safety, Decreased awareness of deficits (L in attention) Awareness: Emergent Problem Solving: Slow processing, Difficulty sequencing, Requires verbal cues, Requires tactile cues General Comments: pt impulsive with inability to process multistep commands. pt reports "someone didn't clean very good in hear. I see a bunch of bugs down there on the floor" however no bugs present, pt with noted visual hallucinations        Exercises      General Comments General comments (skin integrity, edema, etc.): pt assisted to [facility], with set up patient able to perform pericare      Pertinent Vitals/Pain Pain Assessment Pain Assessment: No/denies pain    Home Living Family/patient expects to be discharged to:: Private residence Living Arrangements: Spouse/significant other;Children                      Prior Function            PT Goals (current goals can now be found in the care plan section) Acute Rehab PT Goals Patient Stated Goal: not stated PT Goal Formulation: With patient Time For Goal Achievement: 11/23/21 Potential to Achieve Goals: Good Progress towards PT goals: Progressing toward goals    Frequency    Min 3X/week      PT Plan Current plan remains appropriate    Co-evaluation              AM-PAC PT "6 Clicks" Mobility   Outcome Measure  Help needed turning from your back to your side while in a flat bed without using bedrails?: A Little Help needed moving from lying on your back to sitting on the side of a flat bed without using bedrails?: A Little Help needed moving to and from a bed to a chair (including a wheelchair)?: A Lot Help needed standing up from a chair using your arms (e.g., wheelchair or bedside chair)?: A Lot Help needed to walk in hospital room?: Total Help needed climbing 3-5 steps with a railing? :  Total 6 Click Score: 12    End of Session Equipment Utilized During Treatment: Gait belt Activity Tolerance: Patient tolerated treatment well Patient left: with call bell/phone within reach;in chair;with chair alarm set Nurse Communication: Mobility status PT Visit Diagnosis: Other abnormalities of gait and mobility (R26.89)     Time: 2130-8657 PT Time Calculation (min) (ACUTE ONLY): 23 min  Charges:  $Gait Training: 8-22 mins $Therapeutic Activity: 8-22 mins                     Kittie Plater, PT, DPT Acute Rehabilitation Services Secure chat preferred Office #: [phone]    Berline Lopes 11/10/2021, 1:51 PM

## 2021-11-10 NOTE — Progress Notes (Signed)
Inpatient Rehab Admissions:  Inpatient Rehab Consult received.  I met with patient and son Fritz Pickerel at the bedside for rehabilitation assessment and to discuss goals and expectations of an inpatient rehab admission.  Both acknowledged understanding of CIR goals and expectations. Pt is not interested in pursuing CIR. She would prefer to continue with [facility] therapy. Fritz Pickerel is supportive of pt's decision. TOC made aware.  Signed: Gayland Curry, [place], [place] Admissions Coordinator [phone]

## 2021-11-10 NOTE — TOC Initial Note (Signed)
Transition of Care [facility]) - Initial/Assessment Note    Patient Details  Name: Vanessa Esparza MRN: [id number] Date of Birth: [birth year]  [facility] CM/SW Contact:    Carles Collet, RN Phone Number: 11/10/2021, 12:26 PM  Clinical Narrative:                Damaris Schooner w patient and son at bedside. They would like to return to home, fully equipped with no DME needs, active w [facility] and would like to continue for OT PT RN.  Orders placed.    Expected Discharge Plan: [place] Barriers to Discharge: Continued Medical Work up   Patient Goals and CMS Choice Patient states their goals for this hospitalization and ongoing recovery are:: to return home CMS Medicare.gov Compare Post Acute Care list provided to:: Other (Comment Required) Choice offered to / list presented to : Adult Children  Expected Discharge Plan and Services Expected Discharge Plan: [place]   Discharge Planning Services: CM Consult Post Acute Care Choice: Home Health Living arrangements for the past 2 months: Single Family Home                           HH Arranged: RN, PT, OT [facility] Agency: [place] ([facility]) Date HH Agency Contacted: 11/10/21 Time HH Agency Contacted: 105 Representative spoke with at [place]: [place] Arrangements/Services Living arrangements for the past 2 months: [place] Lives with:: Spouse   Do you feel safe going back to the place where you live?: Yes          Current home services: DME    Activities of Daily Living Home Assistive Devices/Equipment: Cane (specify quad or straight), Eyeglasses, Dentures (specify type) ADL Screening (condition at time of admission) Patient's cognitive ability adequate to safely complete daily activities?: No Is the patient deaf or have difficulty hearing?: No Does the patient have difficulty seeing, even when wearing glasses/contacts?: No Does the patient have  difficulty concentrating, remembering, or making decisions?: Yes (recently) Patient able to express need for assistance with ADLs?: Yes Does the patient have difficulty dressing or bathing?: Yes (recently) Independently performs ADLs?: Yes (appropriate for developmental age) (mostly - needing some assistance latetly) Does the patient have difficulty walking or climbing stairs?: Yes Weakness of Legs: None Weakness of Arms/Hands: Left  Permission Sought/Granted                  Emotional Assessment              Admission diagnosis:  Stroke-like symptoms [R29.90] Cerebrovascular accident (CVA), unspecified mechanism ([place]) [I63.9] Patient Active Problem List   Diagnosis Date Noted   Meningioma ([place])    Hypokalemia    Seizure ([place]) 11/08/2021   Weakness 08/28/2021   AMS (altered mental status) 08/27/2021   History of meningioma 08/27/2021   Mild fever 08/27/2021   History of seizure    Fall    Hyponatremia    Near syncope    Acute metabolic encephalopathy 62/69/4854   Overactive bladder 03/24/2020   Urge incontinence 03/24/2020   S/P TKR (total knee replacement) using cement, right 06/30/2018   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[place]) 10/01/2016   Personal history of renal cell carcinoma 09/30/2016   Anemia 08/16/2016   Diabetes mellitus type 2, uncomplicated ([place]) 62/70/3500   Hyperlipidemia, unspecified 08/16/2016   Essential hypertension 08/16/2016   Thyroid disease 08/16/2016  Acquired cyst of kidney 04/17/2012   Mixed urge and stress incontinence 93/90/3009   Renal colic 23/30/0762   Malignant neoplasm of kidney ([place]) 03/28/2011   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 [id number], [place] [address] Phone: [phone] Fax: [phone]     Social Determinants of Health (SDOH) Interventions    Readmission Risk Interventions     No data to display

## 2021-11-10 NOTE — Assessment & Plan Note (Addendum)
Recurrent right temporal meningioma.   Plan to continue with prophylactic seizure regime with keppra. Follow up with neurosurgery as outpatient.   Intermittent nausea but no vomiting.  Continue with as needed zofran.

## 2021-11-10 NOTE — Hospital Course (Addendum)
[name] was admitted to the hospital with the working diagnosis of breakthrough seizures.   81 yo female with history of right temporal meningioma status post resection and seizures who presented with left upper and lower extremity sensory deficit, mild dysarthria, mild left sided weakness, and left visual field cut. On her initial physical examination her blood pressure was 157/74, HR 83, RR 18 and 02 saturation 95%, lungs were clear to auscultation, heart with S1 and S2 present and rhythmic, abdomen soft and no lower extremity edema. Positive dysmetria on the left side.   Na 139 K 3,7 Cl 99, bicarbonate 29, glucose 97, bun 13 cr 0.88 Wbc 4,2 hgb 10,7 plt 251  Sars covid 19 negative  Urine with SG 1,025 protein 30, moderate leukocytes.  Toxicology negative   EKG 88 bpm, left axis, left bundle branch block, sinus rhythm with poor R wave progression, q wave V1 to V3, no ST segment changes, negative T V4 and V5. Positive LVH.   Head CT with no infarct or penumbra. No vascular occulusion head or neck. Stable post surgical changed reflecting right temporal craniotomy for mass resection with unchanged encephalomalacia.   Brain MRI with recurrence of meningioma right temporal side.  Neurology was consulted and patient underwent LTM EEG.  Antiseizure medications were adjusted for breakthrough seizures.   Patient with dysuria and mild pyuria, started on antibiotic therapy.  Plan to follow up as outpatient with neurology and neurosurgery, seizure precautions.

## 2021-11-10 NOTE — Progress Notes (Signed)
Progress Note   Patient: Vanessa Esparza LGX:[id number] DOB: 05/09/1941 DOA: 11/08/2021     1 DOS: the patient was seen and examined on 11/10/2021   Brief hospital course: Vanessa Esparza was admitted to the hospital with the working diagnosis of breakthrough seizures.   81 yo female with history of right temporal meningioma status post resection and seizures who presented with left upper and lower extremity sensory deficit, mild dysarthria, mild left sided weakness, and left visual field cut. On her initial physical examination her blood pressure was 157/74, HR 83, RR 18 and 02 saturation 95%, lungs were clear to auscultation, heart with S1 and S2 present and rhythmic, abdomen soft and no lower extremity edema. Positive dysmetria on the left side.   Na 139 K 3,7 Cl 99, bicarbonate 29, glucose 97, bun 13 cr 0.88 Wbc 4,2 hgb 10,7 plt 251  Sars covid 19 negative  Urine with SG 1,025 protein 30, moderate leukocytes.  Toxicology negative   EKG 88 bpm, left axis, left bundle branch block, sinus rhythm with poor R wave progression, q wave V1 to V3, no ST segment changes, negative T V4 and V5. Positive LVH.   Head CT with no infarct or penumbra. No vascular occulusion head or neck. Stable post surgical changed reflecting right temporal craniotomy for mass resection with unchanged encephalomalacia.   Brain MRI with recurrence of meningioma right temporal side.  Neurology was consulted and patient underwent LTM EEG.  Antiseizure medications were adjusted for breakthrough seizures.   Assessment and Plan: * Seizure (Vanessa Esparza) Break through seizures.  LTM EEG with cortical dysfunction arising from the right fronto temporal region likely secondary to underlying structural abnormality, post ictal state. No seizures or epileptiform discharges.   Plan to continue at a increased dose of Keppra with good toleration.    Meningioma (H[facility]) Recurrent right temporal meningioma.   Plan to continue with  prophylactic seizure regime with keppra. Follow up with neurosurgery as outpatient.   Intermittent nausea but no vomiting.  Continue with as needed zofran.   Acquired hypothyroidism - Continue Synthroid -TSH within normal limit.  Essential hypertension Blood pressure control with lisinopril.    GERD (gastroesophageal reflux disease) -Continue PPI.  Hypokalemia Renal function with serum cr at 0.79 K is 3,2 and Na at 143.  Plan to continue K correction with Kc, will give 80 meq today and follow up renal function and electrolytes in am.         Subjective: Patient with intermittent nausea but not vomiting, per her son at the bedside she has been confused in the evenings.   Physical Exam: Vitals:   11/09/21 2347 11/10/21 0358 11/10/21 0731 11/10/21 1101  BP: (!) 144/65 (!) 107/92 (!) 154/61 (!) 122/59  Pulse: 86 78 73 81  Resp: '19 16 18 18  '$ Temp: 97.6 F (36.4 C) 98.4 F (36.9 C) 97.9 F (36.6 C) 98.1 F (36.7 C)  TempSrc: Oral   Oral  SpO2: 98% 95% 96% 97%  Weight:      Height:       Neurology awake and alert ENT with no pallor Cardiovascular with S1 and S2 present and rhythmic Respiratory with no wheezing Abdomen soft and not distended No lower extremity edema Neurology with preserved strength  Data Reviewed:    Family Communication: I spoke with patient's son at the bedside, we talked in detail about patient's condition, plan of care and prognosis and all questions were addressed.   Disposition: Status is: Inpatient Remains inpatient appropriate  because: plan to discharge him in 24 hrs if patient tolerates po well, and no further seizures.   Planned Discharge Destination: Home  Author: Tawni Millers, MD 11/10/2021 12:20 PM  For on call review [email or web address].

## 2021-11-10 NOTE — Plan of Care (Signed)

## 2021-11-10 NOTE — Progress Notes (Signed)
LTM EEG discontinued - no skin breakdown at unhook.   

## 2021-11-10 NOTE — Progress Notes (Signed)
LTM maint complete - skin irratation under Fp1 Fp2  [facility] monitored, Event button test confirmed by [facility].

## 2021-11-10 NOTE — Procedures (Signed)
Patient Name: ERIKA SLABY  MRN: [id number]  Epilepsy Attending: Lora Havens  Referring Physician/Provider: Kerney Elbe, MD  Duration: 11/09/2021 2334 to 11/10/2021 6384   Patient history: 81 year old female presenting with acute onset of left upper and lower extremity sensory deficit, mild dysarthria, mild left sided weakness and left visual field cut. EEG to evaluate for seizure.   Level of alertness: Awake, asleep   AEDs during EEG study: LEV   Technical aspects: This EEG study was done with scalp electrodes positioned according to the 10-20 International system of electrode placement. Electrical activity was acquired at a sampling rate of '500Hz'$  and reviewed with a high frequency filter of '70Hz'$  and a low frequency filter of '1Hz'$ . EEG data were recorded continuously and digitally stored.    Description: The posterior dominant rhythm consists of 9 Hz activity of moderate voltage (25-35 uV) seen predominantly in posterior head regions, symmetric and reactive to eye opening and eye closing.  Sleep was characterized by vertex waves, sleep spindles (12 to 14 Hz), maximal frontocentral region.  EEG showed continuous low amplitude 2-3 Hz delta slowing in right frontotemporal region. Hyperventilation and photic stimulation were not performed.      ABNORMALITY - Continuous slow, right frontotemporal region   IMPRESSION: This study is suggestive of cortical dysfunction arising from right frontotemporal region likely secondary to underlying structural abnormality, post-ictal state. No seizures or epileptiform discharges were seen throughout the recording.   Jae Bruck Barbra Sarks

## 2021-11-10 NOTE — Assessment & Plan Note (Signed)
Renal function with serum cr at 0.79 K is 3,2 and Na at 143.  Plan to continue K correction with Kc, will give 80 meq today and follow up renal function and electrolytes in am.

## 2021-11-11 DIAGNOSIS — N3000 Acute cystitis without hematuria: Secondary | ICD-10-CM

## 2021-11-11 DIAGNOSIS — K219 Gastro-esophageal reflux disease without esophagitis: Secondary | ICD-10-CM | POA: Diagnosis not present

## 2021-11-11 DIAGNOSIS — E039 Hypothyroidism, unspecified: Secondary | ICD-10-CM | POA: Diagnosis not present

## 2021-11-11 DIAGNOSIS — I10 Essential (primary) hypertension: Secondary | ICD-10-CM | POA: Diagnosis not present

## 2021-11-11 DIAGNOSIS — D329 Benign neoplasm of meninges, unspecified: Secondary | ICD-10-CM | POA: Diagnosis not present

## 2021-11-11 DIAGNOSIS — N390 Urinary tract infection, site not specified: Secondary | ICD-10-CM | POA: Diagnosis present

## 2021-11-11 LAB — GLUCOSE, CAPILLARY
Glucose-Capillary: 111 mg/dL — ABNORMAL HIGH (ref 70–99)
Glucose-Capillary: 139 mg/dL — ABNORMAL HIGH (ref 70–99)
Glucose-Capillary: 227 mg/dL — ABNORMAL HIGH (ref 70–99)

## 2021-11-11 LAB — BASIC METABOLIC PANEL
Anion gap: 10 (ref 5–15)
BUN: 12 mg/dL (ref 8–23)
CO2: 24 mmol/L (ref 22–32)
Calcium: 9.3 mg/dL (ref 8.9–10.3)
Chloride: 106 mmol/L (ref 98–111)
Creatinine, Ser: 0.91 mg/dL (ref 0.44–1.00)
GFR, Estimated: >60 mL/min (ref >60)
Glucose, Bld: 133 mg/dL — ABNORMAL HIGH (ref 70–99)
Potassium: 3.9 mmol/L (ref 3.5–5.1)
Sodium: 140 mmol/L (ref 135–145)

## 2021-11-11 MED ORDER — SULFAMETHOXAZOLE-TRIMETHOPRIM 400-80 MG PO TABS: 1.0000 | ORAL_TABLET | Freq: Two times a day (BID) | ORAL | 0 refills | Status: AC

## 2021-11-11 MED ORDER — SULFAMETHOXAZOLE-TRIMETHOPRIM 400-80 MG PO TABS
1.0000 | ORAL_TABLET | Freq: Two times a day (BID) | ORAL | Status: DC
Start: 2021-11-11 — End: 2021-11-11
  Administered 2021-11-11: 1 via ORAL
  Filled 2021-11-11 (×3): qty 1

## 2021-11-11 MED ORDER — LEVETIRACETAM 750 MG PO TABS: 750.0000 mg | ORAL_TABLET | Freq: Every day | ORAL | 0 refills | Status: DC

## 2021-11-11 MED ORDER — DIAZEPAM 5 MG/0.1ML NA LIQD: NASAL | 0 refills | Status: DC

## 2021-11-11 MED ORDER — LEVETIRACETAM 500 MG PO TABS: 500.0000 mg | ORAL_TABLET | Freq: Every morning | ORAL | Status: DC

## 2021-11-11 NOTE — Assessment & Plan Note (Signed)
Positive dysuria and mild pyuria, present on admission.  Will add 3 days therapy with bactrim and have follow up as outpatient.

## 2021-11-11 NOTE — Discharge Summary (Signed)
Physician Discharge Summary   Patient: Vanessa Esparza MRN: [id number] DOB: 10-Jan-1941  Admit date:     11/08/2021  Discharge date: 11/11/21  Discharge Physician: Jimmy Picket Kade Demicco   PCP: [name], MD   Recommendations at discharge:    Patient's dose of keppra has been increased to 500 mg q am and 750 mg q pm.  Added intranasal diazepam to use as needed for seizures.  Seizure precautions Recommend follow-up with Dr. Melrose Nakayama at [facility] and Dr Cari Caraway ( neurosurgery) ASAP to discuss further management.  Added bactrim for urinary tract infection, duration 3 days therapy.   Discharge Diagnoses: Principal Problem:   Seizure ([place]) Active Problems:   Meningioma ([place])   Acquired hypothyroidism   GERD (gastroesophageal reflux disease)   Essential hypertension   Hypokalemia   UTI (urinary tract infection)  Resolved Problems:   * No resolved hospital problems. [place] Hospital Course: Vanessa Esparza was admitted to the hospital with the working diagnosis of breakthrough seizures.   81 yo female with history of right temporal meningioma status post resection and seizures who presented with left upper and lower extremity sensory deficit, mild dysarthria, mild left sided weakness, and left visual field cut. On her initial physical examination her blood pressure was 157/74, HR 83, RR 18 and 02 saturation 95%, lungs were clear to auscultation, heart with S1 and S2 present and rhythmic, abdomen soft and no lower extremity edema. Positive dysmetria on the left side.   Na 139 K 3,7 Cl 99, bicarbonate 29, glucose 97, bun 13 cr 0.88 Wbc 4,2 hgb 10,7 plt 251  Sars covid 19 negative  Urine with SG 1,025 protein 30, moderate leukocytes.  Toxicology negative   EKG 88 bpm, left axis, left bundle branch block, sinus rhythm with poor R wave progression, q wave V1 to V3, no ST segment changes, negative T V4 and V5. Positive LVH.   Head CT with no infarct or penumbra. No vascular  occulusion head or neck. Stable post surgical changed reflecting right temporal craniotomy for mass resection with unchanged encephalomalacia.   Brain MRI with recurrence of meningioma right temporal side.  Neurology was consulted and patient underwent LTM EEG.  Antiseizure medications were adjusted for breakthrough seizures.   Assessment and Plan: * Seizure ([place]) Break through seizures.  LTM EEG with cortical dysfunction arising from the right fronto temporal region likely secondary to underlying structural abnormality, post ictal state. No seizures or epileptiform discharges.   Patient has tolerated well Keppra increased dose to 500 mg in am and 750 mg q pm.  Added as needed Valtaco 20 mg nasal as needed for seizures more than 5 minutes.   Recommendation to follow up with Dr Melrose Nakayama at [facility] and Dr Solmon Ice neurosurgery as soon as possible to discuss further management.   Patient had confusion in the past, I think her mild hallucinations are related to hospital delirium and sun downing in combination with cognitive impairment.  I have discuss this situation with her son at the bedside, and he is in agreement in discharge home. Clinically no further seizures, EEG has been negative, her po intake is improving.  This morning with no confusion or hallucinations.  Close follow up as outpatient.    Meningioma (HCC) Recurrent right temporal meningioma.   Continue with prophylactic seizure regime with keppra. Follow up with neurosurgery as outpatient.   Intermittent nausea but no vomiting.  Continue with as needed zofran.   Acquired hypothyroidism - Continue Synthroid -TSH within normal  limit.  Essential hypertension Blood pressure control with lisinopril.    GERD (gastroesophageal reflux disease) -Continue PPI.  Hypokalemia Electrolytes were corrected, at the time of her discharge patient has no further nausea and is tolerating po well.  Renal function with serum cr  at 0.91 with K at 3,9 and serum bicarbonate at 24.   UTI (urinary tract infection) Positive dysuria and mild pyuria, present on admission.  Will add 3 days therapy with bactrim and have follow up as outpatient.          Consultants: neurology  Procedures performed: none   Disposition: Home Diet recommendation:  Discharge Diet Orders (From admission, onward)     Start     Ordered   11/11/21 0000  Diet - low sodium heart healthy        11/11/21 0957           Regular diet DISCHARGE MEDICATION: Allergies as of 11/11/2021       Reactions   Arava [leflunomide] Swelling   Swelling tongue and ulcers in mouth    Methotrexate Derivatives Swelling   Tongue swelling   Statins Other (See Comments)   BLOATED FEELING, CHEST FULLNESS        Medication List     STOP taking these medications    folic acid 614 MCG tablet Commonly known as: FOLVITE       TAKE these medications    acetaminophen 325 MG tablet Commonly known as: TYLENOL Take 2 tablets (650 mg total) by mouth every 6 (six) hours as needed for mild pain (or Fever >/= 101).   allopurinol 100 MG tablet Commonly known as: ZYLOPRIM Take 100 mg by mouth daily.   calcium carbonate 600 MG Tabs tablet Commonly known as: OS-CAL Take 600 mg by mouth daily.   clobetasol 0.05 % external solution Commonly known as: TEMOVATE Apply topically 2 (two) times daily.   diazePAM 5 MG/0.1ML Liqd Intranasal Valtoco 20 mg (10 mg in each nostril) to be used for seizure lasting more than 5 minutes.  Can repeat once after 4 hours.  Do not use more than 2 doses to treat one episode.   feeding supplement Liqd Take 237 mLs by mouth 3 (three) times daily between meals.   fexofenadine 180 MG tablet Commonly known as: ALLEGRA Take 180 mg by mouth daily.   Fluocinolone Acetonide Scalp 0.01 % Oil Apply topically.   fluocinonide 0.05 % external solution Commonly known as: LIDEX Apply topically 2 (two) times daily as needed.    glipiZIDE 5 MG 24 hr tablet Commonly known as: GLUCOTROL XL Take 5 mg by mouth daily.   glucose blood test strip USE 1 STRIP TO CHECK GLUCOSE TWICE DAILY   levETIRAcetam 750 MG tablet Commonly known as: KEPPRA Take 1 tablet (750 mg total) by mouth at bedtime. What changed: You were already taking a medication with the same name, and this prescription was added. Make sure you understand how and when to take each.   levETIRAcetam 500 MG tablet Commonly known as: KEPPRA Take 1 tablet (500 mg total) by mouth every morning. Start taking on: November 12, 2021 What changed: when to take this   levothyroxine 125 MCG tablet Commonly known as: SYNTHROID Take 125 mcg by mouth daily before breakfast.   lisinopril 20 MG tablet Commonly known as: ZESTRIL Take 1 tablet (20 mg total) by mouth daily.   mirabegron ER 50 MG Tb24 tablet Commonly known as: MYRBETRIQ Take 1 tablet (50 mg total) by mouth daily.  What changed: how much to take   multivitamin with minerals Tabs tablet Take 1 tablet by mouth daily.   omeprazole 40 MG capsule Commonly known as: PRILOSEC Take 40 mg by mouth daily.   ondansetron 4 MG disintegrating tablet Commonly known as: ZOFRAN-ODT Take 4 mg by mouth every 8 (eight) hours as needed.   pantoprazole 40 MG tablet Commonly known as: PROTONIX Take 40 mg by mouth daily.   sucralfate 1 g tablet Commonly known as: CARAFATE Take 1 g by mouth 2 (two) times daily.   sulfamethoxazole-trimethoprim 400-80 MG tablet Commonly known as: BACTRIM Take 1 tablet by mouth every 12 (twelve) hours for 3 days.        Follow-up [place], [facility] Follow up.   Contact information: [place] [address] [phone]                Discharge Exam: Filed Weights   11/08/21 1356 11/08/21 1421  Weight: 72 kg 72.2 kg   BP (!) 168/78 (BP Location: Left Arm)   Pulse 74   Temp 98.2 F (36.8 C) (Oral)   Resp 16    Ht '5\' 11"'$  (1.803 m)   Wt 72.2 kg   SpO2 98%   BMI 22.20 kg/m   Patient is feeling better, no nausea or seizures. Had confusion and mild hallucinations at night, reported by her son at the bedside   Neurology awake and alert, non focal ENT no pallor Cardiovascular with S1 and S2 present and rhythmic Respiratory with no rales or wheezing Abdomen with no distention  No lower extremity edema   Condition at discharge: stable  The results of significant diagnostics from this hospitalization (including imaging, microbiology, ancillary and laboratory) are listed below for reference.   Imaging Studies: ECHOCARDIOGRAM COMPLETE  Result Date: 11/09/2021    ECHOCARDIOGRAM REPORT   Patient Name:   CHANELL NADEAU Date of Exam: 11/09/2021 Medical Rec #:  [id number]         Height:       71.0 in Accession #:    [id number]        Weight:       159.2 lb Date of Birth:  1940-12-18          BSA:          1.914 m Patient Age:    13 years          BP:           154/70 mmHg Patient Gender: F                 HR:           70 bpm. Exam Location:  Inpatient Procedure: 2D Echo, Cardiac Doppler and Color Doppler Indications:    TIA G45.9  History:        Patient has no prior history of Echocardiogram examinations.                 Risk Factors:Diabetes, Hypertension, Dyslipidemia and GERD.  Sonographer:    Bernadene Person RDCS Referring Phys: [place]  1. Abnormal septal motion . Left ventricular ejection fraction, by estimation, is 55 to 60%. The left ventricle has normal function. The left ventricle has no regional wall motion abnormalities. Left ventricular diastolic parameters were normal.  2. Right ventricular systolic function is normal. The right ventricular size is normal.  3. The mitral valve is abnormal. No evidence of  mitral valve regurgitation. No evidence of mitral stenosis.  4. The aortic valve is normal in structure. Aortic valve regurgitation is not visualized. No aortic stenosis is  present.  5. The inferior vena cava is normal in size with greater than 50% respiratory variability, suggesting right atrial pressure of 3 mmHg. FINDINGS  Left Ventricle: Abnormal septal motion. Left ventricular ejection fraction, by estimation, is 55 to 60%. The left ventricle has normal function. The left ventricle has no regional wall motion abnormalities. The left ventricular internal cavity size was normal in size. There is no left ventricular hypertrophy. Left ventricular diastolic parameters were normal. Right Ventricle: The right ventricular size is normal. No increase in right ventricular wall thickness. Right ventricular systolic function is normal. Left Atrium: Left atrial size was normal in size. Right Atrium: Right atrial size was normal in size. Pericardium: There is no evidence of pericardial effusion. Mitral Valve: The mitral valve is abnormal. There is mild thickening of the mitral valve leaflet(s). There is mild calcification of the mitral valve leaflet(s). Mild mitral annular calcification. No evidence of mitral valve regurgitation. No evidence of mitral valve stenosis. Tricuspid Valve: The tricuspid valve is normal in structure. Tricuspid valve regurgitation is not demonstrated. No evidence of tricuspid stenosis. Aortic Valve: The aortic valve is normal in structure. Aortic valve regurgitation is not visualized. No aortic stenosis is present. Pulmonic Valve: The pulmonic valve was normal in structure. Pulmonic valve regurgitation is not visualized. No evidence of pulmonic stenosis. Aorta: The aortic root is normal in size and structure. Venous: The inferior vena cava is normal in size with greater than 50% respiratory variability, suggesting right atrial pressure of 3 mmHg. IAS/Shunts: No atrial level shunt detected by color flow Doppler.  LEFT VENTRICLE PLAX 2D LVIDd:         4.60 cm     Diastology LVIDs:         2.90 cm     LV e' medial:    5.35 cm/s LV PW:         1.10 cm     LV E/e' medial:   13.7 LV IVS:        0.90 cm     LV e' lateral:   7.87 cm/s LVOT diam:     2.10 cm     LV E/e' lateral: 9.3 LV SV:         71 LV SV Index:   37 LVOT Area:     3.46 cm  LV Volumes (MOD) LV vol d, MOD A2C: 87.2 ml LV vol d, MOD A4C: 72.8 ml LV vol s, MOD A2C: 34.2 ml LV vol s, MOD A4C: 30.7 ml LV SV MOD A2C:     53.0 ml LV SV MOD A4C:     72.8 ml LV SV MOD BP:      46.9 ml RIGHT VENTRICLE RV S prime:     14.70 cm/s TAPSE (M-mode): 2.0 cm LEFT ATRIUM             Index        RIGHT ATRIUM           Index LA diam:        2.80 cm 1.46 cm/m   RA Area:     10.80 cm LA Vol (A2C):   47.1 ml 24.61 ml/m  RA Volume:   20.70 ml  10.82 ml/m LA Vol (A4C):   44.2 ml 23.10 ml/m LA Biplane Vol: 50.2 ml 26.23 ml/m  AORTIC VALVE LVOT  Vmax:   94.30 cm/s LVOT Vmean:  64.300 cm/s LVOT VTI:    0.205 m  AORTA Ao Root diam: 3.50 cm Ao Asc diam:  3.70 cm MITRAL VALVE MV Area (PHT): 3.72 cm     SHUNTS MV Decel Time: 204 msec     Systemic VTI:  0.20 m MV E velocity: 73.30 cm/s   Systemic Diam: 2.10 cm MV A velocity: 101.00 cm/s MV E/A ratio:  0.73 Jenkins Rouge MD Electronically signed by Jenkins Rouge MD Signature Date/Time: 11/09/2021/8:53:33 AM    Final    Overnight EEG with video  Result Date: 11/09/2021 Lora Havens, MD     11/10/2021  8:01 AM Patient Name: HERBERTA PICKRON MRN: [id number] Epilepsy Attending: Lora Havens Referring Physician/Provider: Kerney Elbe, MD Duration: 11/08/2021 2334 to 11/09/2021 2334  Patient history: 81 year old female presenting with acute onset of left upper and lower extremity sensory deficit, mild dysarthria, mild left sided weakness and left visual field cut. EEG to evaluate for seizure.  Level of alertness: Awake, asleep  AEDs during EEG study: LEV  Technical aspects: This EEG study was done with scalp electrodes positioned according to the 10-20 International system of electrode placement. Electrical activity was acquired at a sampling rate of '500Hz'$  and reviewed with a high frequency filter  of '70Hz'$  and a low frequency filter of '1Hz'$ . EEG data were recorded continuously and digitally stored.  Description: The posterior dominant rhythm consists of 9 Hz activity of moderate voltage (25-35 uV) seen predominantly in posterior head regions, symmetric and reactive to eye opening and eye closing.  Sleep was characterized by vertex waves, sleep spindles (12 to 14 Hz), maximal frontocentral region.  EEG showed continuous low amplitude 2-3 Hz delta slowing in right frontotemporal region. Hyperventilation and photic stimulation were not performed.    ABNORMALITY - Continuous slow, right frontotemporal region  IMPRESSION: This study is suggestive of cortical dysfunction arising from right frontotemporal region likely secondary to underlying structural abnormality, post-ictal state. No seizures or epileptiform discharges were seen throughout the recording.  Lora Havens   CT VENOGRAM HEAD  Result Date: 11/09/2021 CLINICAL DATA:  Initial evaluation for suspected dural venous sinus thrombosis. No knee normally with meningioma sinus is acute left lie in the fleck ting [place] with EXAM: CT VENOGRAM HEAD TECHNIQUE: Venographic phase images of the brain were obtained following the administration of intravenous contrast. Multiplanar reformats and maximum intensity projections were generated. RADIATION DOSE REDUCTION: This exam was performed according to the departmental dose-optimization program which includes automated exposure control, adjustment of the mA and/or kV according to patient size and/or use of iterative reconstruction technique. CONTRAST:  50m OMNIPAQUE IOHEXOL 350 MG/ML SOLN COMPARISON:  Prior CTs in MRI from earlier the same day FINDINGS: Normal enhancement seen throughout the superior sagittal sinus to the torcula. Left transverse and sigmoid sinuses are patent as is the left jugular bulb and visualized proximal left internal jugular vein. The right transverse sinus is widely patent proximally. The  right sigmoid sinus is largely indistinguishable from the patient's enhancing recurrent meningioma at the right temporal region, limiting assessment of the dural sinus at this level. However, no filling defects to suggest intraluminal thrombus are seen proximal or distal to the mass. The right jugular bulb is patent as is the visualized proximal right internal jugular vein. Straight sinus, vein of Galen, internal cerebral veins, and basal veins of Rosenthal are patent. No abnormality about the cavernous sinus. Superior orbital veins symmetric and within normal  limits. No evidence for dural sinus thrombosis. No appreciable cortical vein thrombosis. IMPRESSION: 1. The right sigmoid sinus is essentially indistinguishable from the patient's enhancing recurrent meningioma at the right temporal region, limiting assessment at this location. However, no filling defects to suggest intraluminal thrombus are seen proximal or distal to the mass. If there is persistent clinical reason to evaluate the dural sinus at this location, a follow-up examination with MRV, with and without contrast, is felt to likely be of greater utility. 2. Otherwise negative intracranial MRV. No other evidence for dural sinus thrombosis. Electronically Signed   By: Jeannine Boga M.D.   On: 11/09/2021 02:44   EEG adult  Result Date: 11/08/2021 Lora Havens, MD     11/08/2021  9:07 PM Patient Name: VEOLA CAFARO MRN: [id number] Epilepsy Attending: Lora Havens Referring Physician/Provider: Kerney Elbe, MD Date: 11/08/2021 Duration: 24.03 mins Patient history: 81 year old female presenting with acute onset of left upper and lower extremity sensory deficit, mild dysarthria, mild left sided weakness and left visual field cut. EEG to evaluate for seizure. Level of alertness: Awake AEDs during EEG study: LEV Technical aspects: This EEG study was done with scalp electrodes positioned according to the 10-20 International system of  electrode placement. Electrical activity was acquired at a sampling rate of '500Hz'$  and reviewed with a high frequency filter of '70Hz'$  and a low frequency filter of '1Hz'$ . EEG data were recorded continuously and digitally stored. Description: The posterior dominant rhythm consists of 9 Hz activity of moderate voltage (25-35 uV) seen predominantly in posterior head regions, symmetric and reactive to eye opening and eye closing. EEG showed continuous low amplitude 2-3 Hz delta slowing in right frontotemporal region. Hyperventilation and photic stimulation were not performed.   ABNORMALITY - Continuous slow, right frontotemporal region IMPRESSION: This study is suggestive of cortical dysfunction arising from right frontotemporal region likely secondary to underlying structural abnormality, post-ictal state. No seizures or epileptiform discharges were seen throughout the recording. If suspicion for ictal- interictal activity remains a concern, a prolonged study can be considered. Lora Havens   MR BRAIN W WO CONTRAST  Result Date: 11/08/2021 CLINICAL DATA:  Neuro deficit, acute, stroke suspected EXAM: MRI HEAD WITHOUT AND WITH CONTRAST TECHNIQUE: Multiplanar, multiecho pulse sequences of the brain and surrounding structures were obtained without and with intravenous contrast. CONTRAST:  7.77m GADAVIST GADOBUTROL 1 MMOL/ML IV SOLN COMPARISON:  CT head from the same day.  MRI head August 26, 2021. FINDINGS: Brain: No evidence of acute infarct. No evidence of acute hemorrhage, midline shift, hydrocephalus or significant mass effect. Postoperative changes of prior right temporal craniotomy with subjacent encephalomalacia. There is nodular enhancing soft tissue along the intracranial and extracranial component at the operative site, measuring approximately 4.7 x 1.2 cm intracranially on series 18, image 18 and 5.1 x 1.4 cm extra cranially on series 18, image 16. More inferiorly, nodular tumor measures approximately 2.5 x  1.6 cm on series 18, image 17. There is abnormal signal and enhancement of the intervening bone, compatible with involvement. In this region, tumor abuts and probably invades the adjacent proximal sigmoid sinus and distal transverse sinus (for example series 19, image 12) with the sinus remaining patent distally and proximally. 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 Cerebral atrophy. Vascular: Major arterial flow voids are maintained at the skull base. Skull and upper cervical spine: Normal marrow signal. Sinuses/Orbits: Clear sinuses.  No acute orbital findings.  Other: Moderate right mastoid effusion. IMPRESSION: 1. Findings compatible with recurrent intracranial and extracranial meningioma at the site of prior resection without osseous involvement, described above. Tumor abuts and probably invades the adjacent proximal sigmoid sinus and distal transverse dural venous sinuses with the sinus remaining patent distally and proximally. 2. No evidence of acute infarct. Electronically Signed   By: Margaretha Sheffield M.D.   On: 11/08/2021 17:41   CT HEAD CODE STROKE WO CONTRAST  Result Date: 11/08/2021 CLINICAL DATA:  Code stroke EXAM: CT ANGIOGRAPHY HEAD AND NECK CT PERFUSION BRAIN TECHNIQUE: Multidetector CT imaging of the head and neck was performed using the standard protocol during bolus administration of intravenous contrast. Multiplanar CT image reconstructions and MIPs were obtained to evaluate the vascular anatomy. Carotid stenosis measurements (when applicable) are obtained utilizing NASCET criteria, using the distal internal carotid diameter as the denominator. Multiphase CT imaging of the brain was performed following IV bolus contrast injection. Subsequent parametric perfusion maps were calculated using RAPID software. RADIATION DOSE REDUCTION: This exam was performed according to the  departmental dose-optimization program which includes automated exposure control, adjustment of the mA and/or kV according to patient size and/or use of iterative reconstruction technique. CONTRAST:  167m OMNIPAQUE IOHEXOL 350 MG/ML SOLN COMPARISON:  Brain MRI 08/27/2018 FINDINGS: CT HEAD FINDINGS Brain: There is no evidence of acute intracranial hemorrhage, extra-axial fluid collection, or acute infarct. Postsurgical changes reflecting right temporal craniotomy for mass resection are again seen. Previously described suspected recurrent intracranial tumor is not well assessed on this study but is seen along the roof of the mastoid air cells (5-43). Encephalomalacia in the underlying temporal lobe is similar to the prior study. The ventricles are stable in size. Patchy hypodensity in the subcortical and periventricular white matter likely reflecting background chronic white matter microangiopathy is unchanged. There is no mass effect or midline shift. Vascular: There is calcification of the bilateral cavernous ICAs. Skull: Postsurgical changes as above. Heterogeneous calcification along the inner and outer tables of the calvarium along the inferior aspect of the craniotomy site is similar going back to the study from 12/01/2020. There is no acute calvarial fracture. Sinuses/Orbits: There is mild mucosal thickening in the maxillary sinuses. The globes and orbits are unremarkable. Other: Fluid in the right mastoid air cells is unchanged. Soft tissue density overlying the craniotomy site is unchanged. ASPECTS [facility]) - Ganglionic level infarction (caudate, lentiform nuclei, internal capsule, insula, M1-M3 cortex): 7 - Supraganglionic infarction (M4-M6 cortex): 3 Total score (0-10 with 10 being normal): 10 CTA NECK FINDINGS Aortic arch: There is calcified plaque in the imaged aortic arch the origins of the major branch vessels are patent. The subclavian arteries are patent to the level  imaged. Right carotid system: The right common, internal, and external carotid arteries are patent with mild calcified plaque at the bifurcation but no hemodynamically significant stenosis or occlusion. There is no dissection or aneurysm. Left carotid system: The left common, internal, and external carotid arteries are patent, without hemodynamically significant stenosis or occlusion. There is no dissection or aneurysm. Vertebral arteries: The vertebral arteries are patent, without hemodynamically significant stenosis or occlusion. There is no dissection or aneurysm. Skeleton: There is multilevel degenerative change of the cervical spine. There is no acute osseous abnormality or suspicious osseous lesion. There is no visible canal hematoma. Other neck: The thyroid is surgically absent. The soft tissues of the neck are otherwise unremarkable. Upper chest: The imaged lung apices are clear. Review of the MIP  images confirms the above findings CTA HEAD FINDINGS Anterior circulation: There is calcified plaque in the intracranial ICAs without significant stenosis. The bilateral MCAs are patent without proximal high-grade stenosis or occlusion. The bilateral ACAs are patent. The anterior communicating artery is normal. There is no aneurysm or AVM. Posterior circulation: The bilateral V4 segments are patent with minimal calcified plaque on the right. PICA is seen on the left but not definitely identified on the right. The other major cerebellar artery origins are patent. The basilar artery is patent. The bilateral PCAs are patent with mild atherosclerotic irregularity but no proximal high-grade stenosis or occlusion. There is no aneurysm or AVM. 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 There is reconstitution of flow in the upper jugular vein. Anatomic variants: None. Review of the MIP images confirms the above findings CT Brain  Perfusion Findings: ASPECTS: 10 CBF (<30%) Volume: 41m Perfusion (Tmax>6.0s) volume: 057mMismatch Volume: 64m37mnfarction Location:N/a IMPRESSION: 1. No acute intracranial pathology.  ASPECTS is 10 2. No infarct core or penumbra identified on CT perfusion. 3. Patent vasculature of the head and neck with no hemodynamically significant stenosis or occlusion. 4. Stable postsurgical changes reflecting right temporal craniotomy for mass resection with unchanged encephalomalacia in the underlying brain parenchyma. Recurrent meningioma underlying the craniotomy site invades and occludes the right sigmoid sinus with reconstitution of flow in the upper jugular vein. MRI with contrast could better evaluate recurrent tumor. The results of the initial noncontrast head CT were paged via AMION at the time of interpretation on 11/08/2021 at 2:09 pm to provider Dr LinCheral MarkerTA/CTP results were paged at 2:22 p.m. Electronically Signed   By: PetValetta MoleD.   On: 11/08/2021 14:28   CT ANGIO HEAD NECK W WO CM W PERF (CODE STROKE)  Result Date: 11/08/2021 CLINICAL DATA:  Code stroke EXAM: CT ANGIOGRAPHY HEAD AND NECK CT PERFUSION BRAIN TECHNIQUE: Multidetector CT imaging of the head and neck was performed using the standard protocol during bolus administration of intravenous contrast. Multiplanar CT image reconstructions and MIPs were obtained to evaluate the vascular anatomy. Carotid stenosis measurements (when applicable) are obtained utilizing NASCET criteria, using the distal internal carotid diameter as the denominator. Multiphase CT imaging of the brain was performed following IV bolus contrast injection. Subsequent parametric perfusion maps were calculated using RAPID software. RADIATION DOSE REDUCTION: This exam was performed according to the departmental dose-optimization program which includes automated exposure control, adjustment of the mA and/or kV according to patient size and/or use of iterative reconstruction  technique. CONTRAST:  1064m71mNIPAQUE IOHEXOL 350 MG/ML SOLN COMPARISON:  Brain MRI 08/27/2018 FINDINGS: CT HEAD FINDINGS Brain: There is no evidence of acute intracranial hemorrhage, extra-axial fluid collection, or acute infarct. Postsurgical changes reflecting right temporal craniotomy for mass resection are again seen. Previously described suspected recurrent intracranial tumor is not well assessed on this study but is seen along the roof of the mastoid air cells (5-43). Encephalomalacia in the underlying temporal lobe is similar to the prior study. The ventricles are stable in size. Patchy hypodensity in the subcortical and periventricular white matter likely reflecting background chronic white matter microangiopathy is unchanged. There is no mass effect or midline shift. Vascular: There is calcification of the bilateral cavernous ICAs. Skull: Postsurgical changes as above. Heterogeneous calcification along the inner and outer tables of the calvarium along the inferior aspect of the craniotomy site is similar going back to the study from 12/01/2020. There  is no acute calvarial fracture. Sinuses/Orbits: There is mild mucosal thickening in the maxillary sinuses. The globes and orbits are unremarkable. Other: Fluid in the right mastoid air cells is unchanged. Soft tissue density overlying the craniotomy site is unchanged. ASPECTS [facility] Stroke Program Early CT Score) - Ganglionic level infarction (caudate, lentiform nuclei, internal capsule, insula, M1-M3 cortex): 7 - Supraganglionic infarction (M4-M6 cortex): 3 Total score (0-10 with 10 being normal): 10 CTA NECK FINDINGS Aortic arch: There is calcified plaque in the imaged aortic arch the origins of the major branch vessels are patent. The subclavian arteries are patent to the level imaged. Right carotid system: The right common, internal, and external carotid arteries are patent with mild calcified plaque at the bifurcation but no hemodynamically significant  stenosis or occlusion. There is no dissection or aneurysm. Left carotid system: The left common, internal, and external carotid arteries are patent, without hemodynamically significant stenosis or occlusion. There is no dissection or aneurysm. Vertebral arteries: The vertebral arteries are patent, without hemodynamically significant stenosis or occlusion. There is no dissection or aneurysm. Skeleton: There is multilevel degenerative change of the cervical spine. There is no acute osseous abnormality or suspicious osseous lesion. There is no visible canal hematoma. Other neck: The thyroid is surgically absent. The soft tissues of the neck are otherwise unremarkable. Upper chest: The imaged lung apices are clear. Review of the MIP images confirms the above findings CTA HEAD FINDINGS Anterior circulation: There is calcified plaque in the intracranial ICAs without significant stenosis. The bilateral MCAs are patent without proximal high-grade stenosis or occlusion. The bilateral ACAs are patent. The anterior communicating artery is normal. There is no aneurysm or AVM. Posterior circulation: The bilateral V4 segments are patent with minimal calcified plaque on the right. PICA is seen on the left but not definitely identified on the right. The other major cerebellar artery origins are patent. The basilar artery is patent. The bilateral PCAs are patent with mild atherosclerotic irregularity but no proximal high-grade stenosis or occlusion. There is no aneurysm or AVM. 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 There is reconstitution of flow in the upper jugular vein. Anatomic variants: None. Review of the MIP images confirms the above findings CT Brain Perfusion Findings: ASPECTS: 10 CBF (<30%) Volume: 37m Perfusion (Tmax>6.0s) volume: 061mMismatch Volume: 60m108mnfarction Location:N/a IMPRESSION: 1. No acute intracranial pathology.   ASPECTS is 10 2. No infarct core or penumbra identified on CT perfusion. 3. Patent vasculature of the head and neck with no hemodynamically significant stenosis or occlusion. 4. Stable postsurgical changes reflecting right temporal craniotomy for mass resection with unchanged encephalomalacia in the underlying brain parenchyma. Recurrent meningioma underlying the craniotomy site invades and occludes the right sigmoid sinus with reconstitution of flow in the upper jugular vein. MRI with contrast could better evaluate recurrent tumor. The results of the initial noncontrast head CT were paged via AMION at the time of interpretation on 11/08/2021 at 2:09 pm to provider Dr LinCheral MarkerTA/CTP results were paged at 2:22 p.m. Electronically Signed   By: PetValetta MoleD.   On: 11/08/2021 14:28    Microbiology: Results for orders placed or performed during the hospital encounter of 11/08/21  Resp Panel by RT-PCR (Flu A&B, Covid) Anterior Nasal Swab     Status: None   Collection Time: 11/08/21  1:51 PM   Specimen: Anterior Nasal Swab  Result Value Ref Range Status   SARS Coronavirus 2 by  RT PCR NEGATIVE NEGATIVE Final    Comment: (NOTE) SARS-CoV-2 target nucleic acids are NOT DETECTED.  The SARS-CoV-2 RNA is generally detectable in upper respiratory specimens during the acute phase of infection. The lowest concentration of SARS-CoV-2 viral copies this assay can detect is 138 copies/mL. A negative result does not preclude SARS-Cov-2 infection and should not be used as the sole basis for treatment or other patient management decisions. A negative result may occur with  improper specimen collection/handling, submission of specimen other than nasopharyngeal swab, presence of viral mutation(s) within the areas targeted by this assay, and inadequate number of viral copies(<138 copies/mL). A negative result must be combined with clinical observations, patient history, and epidemiological information. The expected  result is Negative.  Fact Sheet for Patients:  EntrepreneurPulse.com.au  Fact Sheet for Healthcare Providers:  IncredibleEmployment.be  This test is no t yet approved or cleared by the [place] FDA and  has been authorized for detection and/or diagnosis of SARS-CoV-2 by FDA under an Emergency Use Authorization (EUA). This EUA will remain  in effect (meaning this test can be used) for the duration of the COVID-19 declaration under Section 564(b)(1) of the Act, 21 U.S.C.section 360bbb-3(b)(1), unless the authorization is terminated  or revoked sooner.       Influenza A by PCR NEGATIVE NEGATIVE Final   Influenza B by PCR NEGATIVE NEGATIVE Final    Comment: (NOTE) The Xpert Xpress SARS-CoV-2/FLU/RSV plus assay is intended as an aid in the diagnosis of influenza from Nasopharyngeal swab specimens and should not be used as a sole basis for treatment. Nasal washings and aspirates are unacceptable for Xpert Xpress SARS-CoV-2/FLU/RSV testing.  Fact Sheet for Patients: EntrepreneurPulse.com.au  Fact Sheet for Healthcare Providers: IncredibleEmployment.be  This test is not yet approved or cleared by the [place] FDA and has been authorized for detection and/or diagnosis of SARS-CoV-2 by FDA under an Emergency Use Authorization (EUA). This EUA will remain in effect (meaning this test can be used) for the duration of the COVID-19 declaration under Section 564(b)(1) of the Act, 21 U.S.C. section 360bbb-3(b)(1), unless the authorization is terminated or revoked.  Performed at [facility], [place], [place], [place] [address]     Labs: CBC: Recent Labs  Lab 11/08/21 1355 11/08/21 1357 11/08/21 1621  WBC 4.2  --  3.6*  NEUTROABS 2.9  --   --   HGB 10.7* 10.9* 10.2*  HCT 33.0* 32.0* 31.5*  MCV 77.1*  --  76.3*  PLT 251  --  762   Basic Metabolic Panel: Recent Labs  Lab  11/08/21 1355 11/08/21 1357 11/08/21 1621 11/10/21 0348 11/11/21 0322  NA 139 137  --  143 140  K 3.7 3.5  --  3.2* 3.9  CL 99 96*  --  104 106  CO2 29  --   --  27 24  GLUCOSE 97 90  --  113* 133*  BUN 13 14  --  9 12  CREATININE 0.88 0.90 0.79 0.76 0.91  CALCIUM 9.2  --   --  9.0 9.3   Liver Function Tests: Recent Labs  Lab 11/08/21 1355  AST 27  ALT 21  ALKPHOS 47  BILITOT 0.8  PROT 5.7*  ALBUMIN 3.5   CBG: Recent Labs  Lab 11/10/21 0624 11/10/21 1234 11/10/21 1628 11/10/21 2158 11/11/21 0918  GLUCAP 117* 193* 136* 111* 139*    Discharge time spent: greater than 30 minutes.  Signed: Tawni Millers, MD [facility] 11/11/2021

## 2021-11-11 NOTE — Progress Notes (Signed)
Physical Therapy Treatment Patient Details Name: Vanessa Esparza MRN: [id number] DOB: [birth year] Today's Date: 11/11/2021   History of Present Illness Pt is an 81 y.o. female who presented to the hospital 6/29 secondary to new onset right-sided lateral gaze and left-sided weakness. MRI revealed recurrent intracranial and extracranial meningioma at the site of prior resection.  PMH:  seizures, HTN, DM, meningioma s/p resection, renal cell carcinoma s/p partial left nephrectomy, RA, hypothyroidism    PT Comments    Pt was able to perform transfers and ambulation with gross min assist for balance support and walker management. Discussed general safety awareness with pt and son who was present and supportive throughout session. Also encouraged a toileting schedule upon return home to decrease instances of pt rushing to the bathroom when she has the urge to void. Son reports they have all the DME needed at home and pt will have 24 hour assist. Will continue to follow and progress as able per POC.   Recommendations for follow up therapy are one component of a multi-disciplinary discharge planning process, led by the attending physician.  Recommendations may be updated based on patient status, additional functional criteria and insurance authorization.  Follow Up Recommendations  Home health PT     Assistance Recommended at Discharge Frequent or constant Supervision/Assistance  Patient can return home with the following A lot of help with walking and/or transfers;A lot of help with bathing/dressing/bathroom;Direct supervision/assist for medications management;Assist for transportation;Help with stairs or ramp for entrance   Equipment Recommendations  None recommended by PT    Recommendations for Other Services Rehab consult     Precautions / Restrictions Precautions Precautions: Fall Restrictions Weight Bearing Restrictions: No     Mobility  Bed Mobility Overal bed mobility: Needs  Assistance Bed Mobility: Supine to Sit     Supine to sit: Min guard     General bed mobility comments: Increased time and use of rail (has rail at home). HOB flat to simulate home environment. Close guard as pt elevated trunk to full sitting position but able to complete without assist.    Transfers Overall transfer level: Needs assistance Equipment used: Rolling walker (2 wheels) Transfers: Sit to/from Stand Sit to Stand: Min assist           General transfer comment: VC's for hand placement on seated surface for safety. Min assist to power up as pt with multiple posterior LOB and unable to achieve full stand without assist from EOB. Pt progressed to a min guard level from the [facility].    Ambulation/Gait Ambulation/Gait assistance: Min assist Gait Distance (Feet): 150 Feet Assistive device: Rolling walker (2 wheels) Gait Pattern/deviations: Step-through pattern, Staggering left, Drifts right/left, Trunk flexed, Decreased stride length Gait velocity: Decreased Gait velocity interpretation: <1.8 ft/sec, indicate of risk for recurrent falls   General Gait Details: Pt ambulating in the hall with difficulty avoiding obstacles on the L. Therapist assisted with balance and walker management. Increased cues for problem solving when attempting to find her room.   Stairs             Wheelchair Mobility    Modified Rankin (Stroke Patients Only)       Balance Overall balance assessment: Needs assistance Sitting-balance support: No upper extremity supported, Feet supported Sitting balance-Leahy Scale: Fair     Standing balance support: Bilateral upper extremity supported, During functional activity, Reliant on assistive device for balance Standing balance-Leahy Scale: Poor  Cognition Arousal/Alertness: Awake/alert Behavior During Therapy: Impulsive Overall Cognitive Status: Impaired/Different from baseline Area of Impairment:  Memory, Problem solving, Following commands, Safety/judgement, Awareness                     Memory: Decreased short-term memory Following Commands: Follows one step commands with increased time, Follows multi-step commands inconsistently Safety/Judgement: Decreased awareness of safety, Decreased awareness of deficits (L in attention) Awareness: Emergent Problem Solving: Slow processing, Difficulty sequencing, Requires verbal cues, Requires tactile cues General Comments: Thinks she is at home. Impulsive at times and requires cues for sequencing and increased time for processing. Son present and engaged in session.        Exercises      General Comments        Pertinent Vitals/Pain Pain Assessment Pain Assessment: No/denies pain    Home Living                          Prior Function            PT Goals (current goals can now be found in the care plan section) Acute Rehab PT Goals Patient Stated Goal: not stated PT Goal Formulation: With patient Time For Goal Achievement: 11/23/21 Potential to Achieve Goals: Good Progress towards PT goals: Progressing toward goals    Frequency    Min 3X/week      PT Plan Current plan remains appropriate    Co-evaluation              AM-PAC PT "6 Clicks" Mobility   Outcome Measure  Help needed turning from your back to your side while in a flat bed without using bedrails?: A Little Help needed moving from lying on your back to sitting on the side of a flat bed without using bedrails?: A Little Help needed moving to and from a bed to a chair (including a wheelchair)?: A Little Help needed standing up from a chair using your arms (e.g., wheelchair or bedside chair)?: A Little Help needed to walk in hospital room?: A Little Help needed climbing 3-5 steps with a railing? : A Little 6 Click Score: 18    End of Session Equipment Utilized During Treatment: Gait belt Activity Tolerance: Patient tolerated  treatment well Patient left: with call bell/phone within reach;in chair;with chair alarm set Nurse Communication: Mobility status PT Visit Diagnosis: Other abnormalities of gait and mobility (R26.89)     Time: 1245-8099 PT Time Calculation (min) (ACUTE ONLY): 30 min  Charges:  $Gait Training: 23-37 mins                     Rolinda Roan, PT, DPT [place] Preferred Office: [phone]    Thelma Comp 11/11/2021, 10:52 AM

## 2022-01-02 ENCOUNTER — Ambulatory Visit: Payer: Medicare Other | Admitting: Urology

## 2022-01-02 ENCOUNTER — Encounter: Payer: Self-pay | Admitting: Urology

## 2022-01-02 VITALS — BP 176/83 | HR 86 | Ht 70.0 in | Wt 151.0 lb

## 2022-01-02 DIAGNOSIS — N3281 Overactive bladder: Secondary | ICD-10-CM

## 2022-01-02 DIAGNOSIS — R300 Dysuria: Secondary | ICD-10-CM

## 2022-01-02 DIAGNOSIS — Z85528 Personal history of other malignant neoplasm of kidney: Secondary | ICD-10-CM

## 2022-01-02 DIAGNOSIS — N390 Urinary tract infection, site not specified: Secondary | ICD-10-CM | POA: Diagnosis not present

## 2022-01-02 LAB — MICROSCOPIC EXAMINATION

## 2022-01-02 LAB — URINALYSIS, COMPLETE
Bilirubin, UA: NEGATIVE
Glucose, UA: NEGATIVE
Nitrite, UA: NEGATIVE
Specific Gravity, UA: 1.03 (ref 1.005–1.030)
Urobilinogen, Ur: 0.2 mg/dL (ref 0.2–1.0)
pH, UA: 5.5 (ref 5.0–7.5)

## 2022-01-02 MED ORDER — CEFUROXIME AXETIL 250 MG PO TABS: 250.0000 mg | ORAL_TABLET | Freq: Two times a day (BID) | ORAL | 0 refills | Status: AC

## 2022-01-02 NOTE — Progress Notes (Signed)
01/02/2022 12:17 PM   Vanessa Esparza 1941-02-12 [id number]  Referring provider: [name], MD [address] [place],  [place] [address]  Chief Complaint  Patient presents with   Recurrent UTI    Urologic history:  1. 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  Overactive bladder with urge incontinence -Myrbetriq 25 mg daily   HPI: 81 y.o. called for earlier than scheduled follow-up for recent UTI.  She presents today with her daughter  Her daughter states she has been hospitalized on 4 occasions this year for symptoms of chronic nausea, dehydration and confusion Her daughter states she has had a negative neurologic evaluation. Charge diagnoses have shown acute metabolic encephalopathy of unclear etiology.   During last hospitalization she was found to have a UTI.  The discharge summary states she will be treated with a 3-day course of Septra DS however was apparently treated with a 10-day course She presently has symptoms of frequency, urgency and dysuria No fever or chills   PMH: Past Medical History:  Diagnosis Date   Anemia    Arthritis    Collagen vascular disease ([place])    Rhematoid Arthritis   Diabetes mellitus without complication (HCC)    GERD (gastroesophageal reflux disease)    Gout    Hyperlipidemia    Hypertension    Hypothyroidism    Meningioma ([place])    Psoriasis    Renal cell carcinoma, left ([place]) 05/2011   Left Partial Nephrectomy   Seizure ([place])    Seizures ([place])    LAST SEIZURE ON 02-08-09    Surgical History: Past Surgical History:  Procedure Laterality Date   ABDOMINAL HYSTERECTOMY     APPENDECTOMY     BACK SURGERY     LUMBAR   BRAIN MENINGIOMA EXCISION     BRAIN SURGERY     CRANIOTOMY WITH EXCISION OF SUPRATENTORIAL MENINGIOMA   COLONOSCOPY     COLONOSCOPY WITH PROPOFOL N/A 01/28/2017   Procedure: COLONOSCOPY WITH PROPOFOL;  Surgeon: Lollie Sails, MD;  Location: [facility] [place] ENDOSCOPY;  Service: Endoscopy;  Laterality: N/A;   COLONOSCOPY WITH PROPOFOL N/A 01/01/2021   Procedure: COLONOSCOPY WITH PROPOFOL;  Surgeon: Annamaria Helling, DO;  Location: [facility] ENDOSCOPY;  Service: Gastroenterology;  Laterality: N/A;   CRANIOTOMY     w/ excision supratentorial meningioma   ESOPHAGOGASTRODUODENOSCOPY     ESOPHAGOGASTRODUODENOSCOPY (EGD) WITH PROPOFOL N/A 01/28/2017   Procedure: ESOPHAGOGASTRODUODENOSCOPY (EGD) WITH PROPOFOL;  Surgeon: Lollie Sails, MD;  Location: [facility] ENDOSCOPY;  Service: Endoscopy;  Laterality: N/A;   ESOPHAGOGASTRODUODENOSCOPY (EGD) WITH PROPOFOL N/A 01/01/2021   Procedure: ESOPHAGOGASTRODUODENOSCOPY (EGD) WITH PROPOFOL;  Surgeon: Annamaria Helling, DO;  Location: [facility] ENDOSCOPY;  Service: Gastroenterology;  Laterality: N/A;   HEMORRHOID SURGERY     KNEE ARTHROSCOPY Right 09/21/2015   Procedure: ARTHROSCOPY KNEE, LATERAL AND MEDIAL MENISECTOMY;  Surgeon: Hessie Knows, MD;  Location: [facility] ORS;  Service: Orthopedics;  Laterality: Right;   LAPAROSCOPIC BILATERAL SALPINGO OOPHERECTOMY Bilateral 04/12/2019   Procedure: LAPAROSCOPIC BILATERAL SALPINGO OOPHORECTOMY;  Surgeon: Ward, Honor Loh, MD;  Location: [facility] ORS;  Service: Gynecology;  Laterality: Bilateral;   PARTIAL NEPHRECTOMY Left    DUE TO RENAL CELL CARCINOMA   REDUCTION MAMMAPLASTY Bilateral    THYROID SURGERY     TOTAL KNEE ARTHROPLASTY Right 06/30/2018   Procedure: TOTAL KNEE ARTHROPLASTY-RIGHT;  Surgeon: Hessie Knows, MD;  Location: [facility] ORS;  Service: Orthopedics;  Laterality: Right;    Home Medications:  Allergies as of 01/02/2022       Reactions   Arava [leflunomide] Swelling   Swelling tongue and ulcers in mouth    Methotrexate Derivatives Swelling   Tongue swelling   Statins Other (See Comments)   BLOATED FEELING, CHEST FULLNESS        Medication List        Accurate as of January 02, 2022 12:17 PM. If you have any questions, ask your  nurse or doctor.          acetaminophen 325 MG tablet Commonly known as: TYLENOL Take 2 tablets (650 mg total) by mouth every 6 (six) hours as needed for mild pain (or Fever >/= 101).   allopurinol 100 MG tablet Commonly known as: ZYLOPRIM Take 100 mg by mouth daily.   calcium carbonate 600 MG Tabs tablet Commonly known as: OS-CAL Take 600 mg by mouth daily.   clobetasol 0.05 % external solution Commonly known as: TEMOVATE Apply topically 2 (two) times daily.   diazePAM 5 MG/0.1ML Liqd Intranasal Valtoco 20 mg (10 mg in each nostril) to be used for seizure lasting more than 5 minutes.  Can repeat once after 4 hours.  Do not use more than 2 doses to treat one episode.   feeding supplement Liqd Take 237 mLs by mouth 3 (three) times daily between meals.   fexofenadine 180 MG tablet Commonly known as: ALLEGRA Take 180 mg by mouth daily.   Fluocinolone Acetonide Scalp 0.01 % Oil Apply topically.   fluocinonide 0.05 % external solution Commonly known as: LIDEX Apply topically 2 (two) times daily as needed.   glipiZIDE 5 MG 24 hr tablet Commonly known as: GLUCOTROL XL Take 5 mg by mouth daily.   glucose blood test strip USE 1 STRIP TO CHECK GLUCOSE TWICE DAILY   levETIRAcetam 750 MG tablet Commonly known as: KEPPRA Take 1 tablet (750 mg total) by mouth at bedtime.   levETIRAcetam 500 MG tablet Commonly known as: KEPPRA Take 1 tablet (500 mg total) by mouth every morning.   levothyroxine 125 MCG tablet Commonly known as: SYNTHROID Take 125 mcg by mouth daily before breakfast.   lisinopril 20 MG tablet Commonly known as: ZESTRIL Take 1 tablet (20 mg total) by mouth daily.   mirabegron ER 50 MG Tb24 tablet Commonly known as: MYRBETRIQ Take 1 tablet (50 mg total) by mouth daily. What changed: how much to take   multivitamin with minerals Tabs tablet Take 1 tablet by mouth daily.   omeprazole 40 MG capsule Commonly known as: PRILOSEC Take 40 mg by mouth  daily.   ondansetron 4 MG disintegrating tablet Commonly known as: ZOFRAN-ODT Take 4 mg by mouth every 8 (eight) hours as needed.   pantoprazole 40 MG tablet Commonly known as: PROTONIX Take 40 mg by mouth daily.   sucralfate 1 g tablet Commonly known as: CARAFATE Take 1 g by mouth 2 (two) times daily.        Allergies:  Allergies  Allergen Reactions   Arava [Leflunomide] Swelling    Swelling tongue and ulcers in mouth    Methotrexate Derivatives Swelling    Tongue swelling   Statins Other (See Comments)    BLOATED FEELING, CHEST FULLNESS    Family History: Family History  Problem Relation Age of Onset   Breast cancer Other    Breast cancer Mother     Social History:  reports that she has never smoked. She has never used smokeless tobacco. She reports that she does not drink  alcohol and does not use drugs.   Physical Exam: BP (!) 176/83   Pulse 86   Ht '5\' 10"'$  (1.778 m)   Wt 151 lb (68.5 kg)   BMI 21.67 kg/m   Constitutional:  Alert, No acute distress. HEENT: [place] AT Respiratory: Normal respiratory effort, no increased work of breathing. Psychiatric: Normal mood and affect.  Laboratory Data:  Urinalysis Dipstick 1+ ketones/1+ blood/3+ protein/trace leukocytes Microscopy 11-30 WBC/3-10 RBC/many bacteria and no significant epis   Assessment & Plan:    1.  Recurrent UTI UA today with significant pyuria; urine culture ordered Review of records remarkable for only 1 positive urine culture in the last 12 months in November 2022 a urine culture was not ordered during her last hospitalization Rx cefuroxime 250 mg twice daily sent to pharmacy pending urine culture results Unlikely her chronic symptoms of nausea, encephalopathy are due to UTI.  If her urine culture is positive will place on low-dose antibiotic x6-8 weeks prophylaxis to see if this makes any difference   Abbie Sons, MD  [place] [address] [place], [place] [address] [phone]

## 2022-01-05 LAB — CULTURE, URINE COMPREHENSIVE

## 2022-01-07 ENCOUNTER — Encounter: Payer: Self-pay | Admitting: *Deleted

## 2022-01-23 ENCOUNTER — Telehealth: Payer: Self-pay | Admitting: Urology

## 2022-01-23 NOTE — Telephone Encounter (Signed)
Talked with patient about cysto

## 2022-01-23 NOTE — Telephone Encounter (Signed)
Pt called and wants to know why she is having the 9/18 appt Cysto.  Please call pt [phone]

## 2022-01-28 ENCOUNTER — Ambulatory Visit: Payer: Medicare Other | Admitting: Urology

## 2022-01-28 ENCOUNTER — Encounter: Payer: Self-pay | Admitting: Urology

## 2022-01-28 VITALS — BP 180/79 | HR 65 | Ht 70.0 in | Wt 144.0 lb

## 2022-01-28 DIAGNOSIS — N281 Cyst of kidney, acquired: Secondary | ICD-10-CM

## 2022-01-28 DIAGNOSIS — N3281 Overactive bladder: Secondary | ICD-10-CM | POA: Diagnosis not present

## 2022-01-28 DIAGNOSIS — Z85528 Personal history of other malignant neoplasm of kidney: Secondary | ICD-10-CM

## 2022-01-28 DIAGNOSIS — N2889 Other specified disorders of kidney and ureter: Secondary | ICD-10-CM

## 2022-01-28 NOTE — Progress Notes (Unsigned)
   01/28/22  CC:  Chief Complaint  Patient presents with   Cysto   Over Active Bladder    HPI:  UA today 3-10 RBC/negative WBC Refer to rooming tab for vitals    Cystoscopy Procedure Note  Patient identification was confirmed, informed consent was obtained, and patient was prepped using Betadine solution.  Lidocaine jelly was administered per urethral meatus.    Procedure: - Flexible cystoscope introduced, without any difficulty.   - Thorough search of the bladder revealed:    normal urethral meatus    normal urothelium    no stones    no ulcers     no tumors    no urethral polyps    no trabeculation  - Ureteral orifices were normal in position and appearance.  Post-Procedure: - Patient tolerated the procedure well  Assessment/ Plan:  ***CT order  No follow-ups on file.  Abbie Sons, MD

## 2022-01-29 LAB — MICROSCOPIC EXAMINATION

## 2022-01-29 LAB — URINALYSIS, COMPLETE
Bilirubin, UA: NEGATIVE
Glucose, UA: NEGATIVE
Ketones, UA: NEGATIVE
Leukocytes,UA: NEGATIVE
Nitrite, UA: NEGATIVE
Specific Gravity, UA: 1.025 (ref 1.005–1.030)
Urobilinogen, Ur: 0.2 mg/dL (ref 0.2–1.0)
pH, UA: 5 (ref 5.0–7.5)

## 2022-03-20 ENCOUNTER — Other Ambulatory Visit: Payer: Self-pay

## 2022-03-20 DIAGNOSIS — D329 Benign neoplasm of meninges, unspecified: Secondary | ICD-10-CM

## 2022-03-27 ENCOUNTER — Ambulatory Visit: Payer: Medicare Other | Admitting: Urology

## 2022-03-27 ENCOUNTER — Encounter: Payer: Self-pay | Admitting: Urology

## 2022-03-27 VITALS — BP 201/69 | HR 67 | Ht 70.0 in | Wt 145.0 lb

## 2022-03-27 DIAGNOSIS — Z85528 Personal history of other malignant neoplasm of kidney: Secondary | ICD-10-CM | POA: Diagnosis not present

## 2022-03-27 DIAGNOSIS — N281 Cyst of kidney, acquired: Secondary | ICD-10-CM

## 2022-03-27 DIAGNOSIS — N3281 Overactive bladder: Secondary | ICD-10-CM

## 2022-03-27 LAB — BLADDER SCAN AMB NON-IMAGING: Scan Result: 0

## 2022-03-27 MED ORDER — GEMTESA 75 MG PO TABS: 75.0000 mg | ORAL_TABLET | Freq: Every day | ORAL | 0 refills | Status: DC

## 2022-03-27 NOTE — Progress Notes (Signed)
03/27/2022 9:41 AM   Vanessa Esparza [birth year] [id number]  Referring provider: [name], MD [address] [place],  [place] [address]  Chief Complaint  Patient presents with   Over Active Bladder    Urologic history:  1. 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  Overactive bladder with urge incontinence -Myrbetriq 50 mg daily   HPI: 81 y.o. female presents for annual follow-up.  Remains on Myrbetriq 50 mg which has been effective though still has increased urgency Was seen 01/02/2022 for recurrent UTI and was given a 8-week trial of low-dose antibiotic prophylaxis.  Cystoscopy 01/28/2022 showed no lower tract abnormalities. A CT performed July 2022 in the ED showed an irregular ring density along the posterior capsular margin of the left kidney and a renal mass protocol CT was recommended.  An order was placed 01/30/2022 however she states she was never contacted by radiology to schedule   PMH: Past Medical History:  Diagnosis Date   Anemia    Arthritis    Collagen vascular disease ([place])    Rhematoid Arthritis   Diabetes mellitus without complication (HCC)    GERD (gastroesophageal reflux disease)    Gout    Hyperlipidemia    Hypertension    Hypothyroidism    Meningioma ([place])    Psoriasis    Renal cell carcinoma, left ([place]) 05/2011   Left Partial Nephrectomy   Seizure ([place])    Seizures ([place])    LAST SEIZURE ON 02-08-09    Surgical History: Past Surgical History:  Procedure Laterality Date   ABDOMINAL HYSTERECTOMY     APPENDECTOMY     BACK SURGERY     LUMBAR   BRAIN MENINGIOMA EXCISION     BRAIN SURGERY     CRANIOTOMY WITH EXCISION OF SUPRATENTORIAL MENINGIOMA   COLONOSCOPY     COLONOSCOPY WITH PROPOFOL N/A 01/28/2017   Procedure: COLONOSCOPY WITH PROPOFOL;  Surgeon: Lollie Sails, MD;  Location: [facility] ENDOSCOPY;  Service: Endoscopy;  Laterality: N/A;   COLONOSCOPY  WITH PROPOFOL N/A 01/01/2021   Procedure: COLONOSCOPY WITH PROPOFOL;  Surgeon: Annamaria Helling, DO;  Location: [facility] ENDOSCOPY;  Service: Gastroenterology;  Laterality: N/A;   CRANIOTOMY     w/ excision supratentorial meningioma   ESOPHAGOGASTRODUODENOSCOPY     ESOPHAGOGASTRODUODENOSCOPY (EGD) WITH PROPOFOL N/A 01/28/2017   Procedure: ESOPHAGOGASTRODUODENOSCOPY (EGD) WITH PROPOFOL;  Surgeon: Lollie Sails, MD;  Location: [facility] ENDOSCOPY;  Service: Endoscopy;  Laterality: N/A;   ESOPHAGOGASTRODUODENOSCOPY (EGD) WITH PROPOFOL N/A 01/01/2021   Procedure: ESOPHAGOGASTRODUODENOSCOPY (EGD) WITH PROPOFOL;  Surgeon: Annamaria Helling, DO;  Location[facility] ENDOSCOPY;  Service: Gastroenterology;  Laterality: N/A;   HEMORRHOID SURGERY     KNEE ARTHROSCOPY Right 09/21/2015   Procedure: ARTHROSCOPY KNEE, LATERAL AND MEDIAL MENISECTOMY;  Surgeon: Hessie Knows, MD;  Location: [facility] ORS;  Service: Orthopedics;  Laterality: Right;   LAPAROSCOPIC BILATERAL SALPINGO OOPHERECTOMY Bilateral 04/12/2019   Procedure: LAPAROSCOPIC BILATERAL SALPINGO OOPHORECTOMY;  Surgeon: Ward, Honor Loh, MD;  Location: [facility] ORS;  Service: Gynecology;  Laterality: Bilateral;   PARTIAL NEPHRECTOMY Left    DUE TO RENAL CELL CARCINOMA   REDUCTION MAMMAPLASTY Bilateral    THYROID SURGERY     TOTAL KNEE ARTHROPLASTY Right 06/30/2018   Procedure: TOTAL KNEE ARTHROPLASTY-RIGHT;  Surgeon: Hessie Knows, MD;  Location: [facility] ORS;  Service: Orthopedics;  Laterality: Right;    Home Medications:  Allergies as of 03/27/2022       Reactions  Arava [leflunomide] Swelling   Swelling tongue and ulcers in mouth    Methotrexate Derivatives Swelling   Tongue swelling   Statins Other (See Comments)   BLOATED FEELING, CHEST FULLNESS        Medication List        Accurate as of March 27, 2022  9:41 AM. If you have any questions, ask your nurse or doctor.          STOP taking these medications    mirabegron ER 50 MG Tb24  tablet Commonly known as: MYRBETRIQ Stopped by: Abbie Sons, MD       TAKE these medications    acetaminophen 325 MG tablet Commonly known as: TYLENOL Take 2 tablets (650 mg total) by mouth every 6 (six) hours as needed for mild pain (or Fever >/= 101).   allopurinol 100 MG tablet Commonly known as: ZYLOPRIM Take 100 mg by mouth daily.   calcium carbonate 600 MG Tabs tablet Commonly known as: OS-CAL Take 600 mg by mouth daily.   diazePAM 5 MG/0.1ML Liqd Intranasal Valtoco 20 mg (10 mg in each nostril) to be used for seizure lasting more than 5 minutes.  Can repeat once after 4 hours.  Do not use more than 2 doses to treat one episode.   feeding supplement Liqd Take 237 mLs by mouth 3 (three) times daily between meals.   fexofenadine 180 MG tablet Commonly known as: ALLEGRA Take 180 mg by mouth daily.   Fluocinolone Acetonide Scalp 0.01 % Oil Apply topically.   fluocinonide 0.05 % external solution Commonly known as: LIDEX Apply topically 2 (two) times daily as needed.   Gemtesa 75 MG Tabs Generic drug: Vibegron Take 75 mg by mouth daily. Started by: Abbie Sons, MD   glipiZIDE 5 MG 24 hr tablet Commonly known as: GLUCOTROL XL Take 5 mg by mouth daily.   glucose blood test strip USE 1 STRIP TO CHECK GLUCOSE TWICE DAILY   levETIRAcetam 750 MG tablet Commonly known as: KEPPRA Take 1 tablet (750 mg total) by mouth at bedtime.   levETIRAcetam 500 MG tablet Commonly known as: KEPPRA Take 1 tablet (500 mg total) by mouth every morning.   levothyroxine 125 MCG tablet Commonly known as: SYNTHROID Take 125 mcg by mouth daily before breakfast.   lisinopril 20 MG tablet Commonly known as: ZESTRIL Take 1 tablet (20 mg total) by mouth daily.   multivitamin with minerals Tabs tablet Take 1 tablet by mouth daily.   omeprazole 40 MG capsule Commonly known as: PRILOSEC Take 40 mg by mouth daily.   ondansetron 4 MG disintegrating tablet Commonly known  as: ZOFRAN-ODT Take 4 mg by mouth every 8 (eight) hours as needed.   pantoprazole 40 MG tablet Commonly known as: PROTONIX Take 40 mg by mouth daily.   sucralfate 1 g tablet Commonly known as: CARAFATE Take 1 g by mouth 2 (two) times daily.        Allergies:  Allergies  Allergen Reactions   Arava [Leflunomide] Swelling    Swelling tongue and ulcers in mouth    Methotrexate Derivatives Swelling    Tongue swelling   Statins Other (See Comments)    BLOATED FEELING, CHEST FULLNESS    Family History: Family History  Problem Relation Age of Onset   Breast cancer Other    Breast cancer Mother     Social History:  reports that she has never smoked. She has never used smokeless tobacco. She reports that she does not drink  alcohol and does not use drugs.   Physical Exam: BP (!) 201/69   Pulse 67   Ht '5\' 10"'$  (1.778 m)   Wt 145 lb (65.8 kg)   BMI 20.81 kg/m   Constitutional:  Alert and oriented, No acute distress. HEENT: [place] AT, moist mucus membranes.  Trachea midline, no masses. Cardiovascular: No clubbing, cyanosis, or edema. Respiratory: Normal respiratory effort, no increased work of breathing. Psychiatric: Normal mood and affect.  Laboratory Data:  Urinalysis 03/26/2021: Dipstick 1+ blood, 1+ protein, 2+ leukocytes/microscopy >30 WBC   Assessment & Plan:    1.  History renal cell carcinoma She states she would call radiology to schedule and was given the number  2. Overactive bladder Some persistent urgency though symptoms overall have improved on Myrbetriq Given samples Gemtesa to try for 28 days and if she feels efficacy is better with this medication will change PVR today 0 mL  3.  Recurrent UTI No recent infections   Continue annual follow-up   Abbie Sons, MD  [place] [address] [place], [place] [address] [phone]

## 2022-03-31 ENCOUNTER — Other Ambulatory Visit: Payer: Self-pay | Admitting: Urology

## 2022-04-29 ENCOUNTER — Other Ambulatory Visit: Payer: Self-pay | Admitting: *Deleted

## 2022-04-29 MED ORDER — MIRABEGRON ER 50 MG PO TB24: 50.0000 mg | ORAL_TABLET | Freq: Every day | ORAL | 3 refills | Status: DC

## 2022-04-29 NOTE — Telephone Encounter (Signed)
Patient states she like the myrbetriq better than the gemtesa . Sent in medication refill of myrbetiq

## 2022-05-01 ENCOUNTER — Ambulatory Visit
Admission: RE | Admit: 2022-05-01 | Discharge: 2022-05-01 | Disposition: A | Discharge status: Home / Self Care | Payer: Medicare Other | Source: Ambulatory Visit | Attending: Urology | Admitting: Urology

## 2022-05-01 DIAGNOSIS — N2889 Other specified disorders of kidney and ureter: Secondary | ICD-10-CM | POA: Diagnosis present

## 2022-05-01 LAB — POCT I-STAT CREATININE: Creatinine, Ser: 0.8 mg/dL (ref 0.44–1.00)

## 2022-05-01 MED ORDER — IOHEXOL 300 MG/ML  SOLN
100.0000 mL | Freq: Once | INTRAMUSCULAR | Status: AC | PRN
  Administered 2022-05-01: 100 mL via INTRAVENOUS

## 2022-05-07 ENCOUNTER — Other Ambulatory Visit: Payer: Self-pay | Admitting: Urology

## 2022-05-07 ENCOUNTER — Encounter: Payer: Self-pay | Admitting: Urology

## 2022-05-07 DIAGNOSIS — N281 Cyst of kidney, acquired: Secondary | ICD-10-CM

## 2022-05-07 DIAGNOSIS — Z85528 Personal history of other malignant neoplasm of kidney: Secondary | ICD-10-CM

## 2022-07-22 ENCOUNTER — Telehealth: Payer: Self-pay | Admitting: Urology

## 2022-07-22 DIAGNOSIS — N3281 Overactive bladder: Secondary | ICD-10-CM

## 2022-07-22 MED ORDER — MIRABEGRON ER 50 MG PO TB24: 50.0000 mg | ORAL_TABLET | Freq: Every day | ORAL | 3 refills | Status: DC

## 2022-07-22 NOTE — Telephone Encounter (Signed)
Patient called to request refill of Myrbetriq 50 mg. She said she thought she was supposed to get 90 pills, but pharmacy will only give her 59. Pharmacy is [facility] on [address].

## 2022-07-22 NOTE — Telephone Encounter (Signed)
Sent new refill to [facility].

## 2022-07-25 ENCOUNTER — Telehealth: Payer: Self-pay

## 2022-07-25 NOTE — Telephone Encounter (Signed)
Pt states she needs a refill of Myrbetriq.   Pt aware JQ sent refill in to [facility] on [address] 3/11 at 933am.   CM

## 2022-12-05 ENCOUNTER — Telehealth: Payer: Self-pay

## 2022-12-05 NOTE — Progress Notes (Unsigned)
12/06/2022 9:28 AM   Vanessa Esparza Sep 19, 1940 [id number]  Referring provider: Jerl Mina, MD [address] [place],  [place] [address]  Urological history: 1. pT1a renal cell carcinoma -Left partial nephrectomy 05/2011 at [facility] grade 2; clear-cell -CTU (04/2022) - Unchanged appearance of the posterior midportion of the left kidney status post partial nephrectomy.  Just inferior to the partial nephrectomy site is an exophytic oval lesion measuring 1.7 x 1.0 cm. Although of intermediate attenuation, there is no evidence of contrast enhancement. This is most likely postoperative scarring or residual of an involuted cyst.  Although very likely benign, continued attention on follow-up is warranted.  Additional complex, partially rim calcified exophytic lesion of the inferior pole of the right kidney is unchanged, measuring 1.0 x 1.0 cm. This is likewise of intermediate attenuation and without evidence of contrast enhancement. This is also most likely sequelae of an involuted cyst, although as above, attention on follow-up is warranted given complexity.   2.  Overactive bladder with urge incontinence -Contributing factors of age, GSM, hypertension and diabetes -cysto (01/2022) - NED  -Myrbetriq 50 mg daily  3. High risk hematuria -non-smoker -hx of RCC - see #1 -CTU (04/2022) - see #1 -cysto (01/2022) - NED   Chief Complaint  Patient presents with   Urinary Tract Infection    HPI: Vanessa Esparza is a 82 y.o. female who presents today for dysuria and urinary frequency for two days.    Previous records reviewed.   She has a history of mixed urinary continence which she manages with Myrbetriq 50 mg daily and depends daily.  She has noted though over the last week her frequency of incontinence and the volume of incontinence has worsened.  Patient denies any modifying or aggravating factors.  Patient denies any recent UTI's, gross hematuria, dysuria or  suprapubic/flank pain.  Patient denies any fevers, chills, nausea or vomiting.   UA yellow clear, specific gravity greater than 1.030, pH 6, 3+ protein, 6-10 WBCs, 0-2 RBCs, 0-10 epithelial cells, hyaline casts present, granular casts present mucus threads present and moderate bacteria.  PMH: Past Medical History:  Diagnosis Date   Anemia    Arthritis    Collagen vascular disease (HCC)    Rhematoid Arthritis   Diabetes mellitus without complication (HCC)    GERD (gastroesophageal reflux disease)    Gout    Hyperlipidemia    Hypertension    Hypothyroidism    Meningioma (HCC)    Psoriasis    Renal cell carcinoma, left (HCC) 05/2011   Left Partial Nephrectomy   Seizure (HCC)    Seizures (HCC)    LAST SEIZURE ON 02-08-09    Surgical History: Past Surgical History:  Procedure Laterality Date   ABDOMINAL HYSTERECTOMY     APPENDECTOMY     BACK SURGERY     LUMBAR   BRAIN MENINGIOMA EXCISION     BRAIN SURGERY     CRANIOTOMY WITH EXCISION OF SUPRATENTORIAL MENINGIOMA   COLONOSCOPY     COLONOSCOPY WITH PROPOFOL N/A 01/28/2017   Procedure: COLONOSCOPY WITH PROPOFOL;  Surgeon: Christena Deem, MD;  Location: [facility] ENDOSCOPY;  Service: Endoscopy;  Laterality: N/A;   COLONOSCOPY WITH PROPOFOL N/A 01/01/2021   Procedure: COLONOSCOPY WITH PROPOFOL;  Surgeon: Jaynie Collins, DO;  Location: [facility] ENDOSCOPY;  Service: Gastroenterology;  Laterality: N/A;   CRANIOTOMY     w/ excision supratentorial meningioma   ESOPHAGOGASTRODUODENOSCOPY     ESOPHAGOGASTRODUODENOSCOPY (EGD) WITH PROPOFOL N/A 01/28/2017  Procedure: ESOPHAGOGASTRODUODENOSCOPY (EGD) WITH PROPOFOL;  Surgeon: Christena Deem, MD;  Location: [facility] ENDOSCOPY;  Service: Endoscopy;  Laterality: N/A;   ESOPHAGOGASTRODUODENOSCOPY (EGD) WITH PROPOFOL N/A 01/01/2021   Procedure: ESOPHAGOGASTRODUODENOSCOPY (EGD) WITH PROPOFOL;  Surgeon: Jaynie Collins, DO;  Location: [facility] ENDOSCOPY;  Service: Gastroenterology;  Laterality:  N/A;   HEMORRHOID SURGERY     KNEE ARTHROSCOPY Right 09/21/2015   Procedure: ARTHROSCOPY KNEE, LATERAL AND MEDIAL MENISECTOMY;  Surgeon: Kennedy Bucker, MD;  Location: [facility] ORS;  Service: Orthopedics;  Laterality: Right;   LAPAROSCOPIC BILATERAL SALPINGO OOPHERECTOMY Bilateral 04/12/2019   Procedure: LAPAROSCOPIC BILATERAL SALPINGO OOPHORECTOMY;  Surgeon: Ward, Elenora Fender, MD;  Location: [facility] ORS;  Service: Gynecology;  Laterality: Bilateral;   PARTIAL NEPHRECTOMY Left    DUE TO RENAL CELL CARCINOMA   REDUCTION MAMMAPLASTY Bilateral    THYROID SURGERY     TOTAL KNEE ARTHROPLASTY Right 06/30/2018   Procedure: TOTAL KNEE ARTHROPLASTY-RIGHT;  Surgeon: Kennedy Bucker, MD;  Location: [facility] ORS;  Service: Orthopedics;  Laterality: Right;    Home Medications:  Allergies as of 12/06/2022       Reactions   Arava [leflunomide] Swelling   Swelling tongue and ulcers in mouth    Methotrexate Derivatives Swelling   Tongue swelling   Statins Other (See Comments)   BLOATED FEELING, CHEST FULLNESS        Medication List        Accurate as of December 06, 2022  9:28 AM. If you have any questions, ask your nurse or doctor.          STOP taking these medications    allopurinol 100 MG tablet Commonly known as: ZYLOPRIM Stopped by: Neveyah Garzon   diazePAM 5 MG/0.1ML Liqd Stopped by: Meara Wiechman   levETIRAcetam 500 MG tablet Commonly known as: KEPPRA Stopped by: Jamelah Sitzer   levETIRAcetam 750 MG tablet Commonly known as: KEPPRA Stopped by: Clovis Mankins   ondansetron 4 MG disintegrating tablet Commonly known as: ZOFRAN-ODT Stopped by: Aaleeyah Bias       TAKE these medications    calcium carbonate 600 MG Tabs tablet Commonly known as: OS-CAL Take 600 mg by mouth daily.   cefUROXime 500 MG tablet Commonly known as: CEFTIN Take 1 tablet (500 mg total) by mouth 2 (two) times daily with a meal. Started by: Carollee Herter Moorea Boissonneault   feeding supplement Liqd Take 237 mLs by  mouth 3 (three) times daily between meals.   fexofenadine 180 MG tablet Commonly known as: ALLEGRA Take 180 mg by mouth daily.   Fluocinolone Acetonide Scalp 0.01 % Oil Apply topically.   fluocinonide 0.05 % external solution Commonly known as: LIDEX Apply topically 2 (two) times daily as needed.   glipiZIDE 5 MG 24 hr tablet Commonly known as: GLUCOTROL XL Take 5 mg by mouth daily.   glucose blood test strip USE 1 STRIP TO CHECK GLUCOSE TWICE DAILY   levothyroxine 125 MCG tablet Commonly known as: SYNTHROID Take 125 mcg by mouth daily before breakfast.   lisinopril 20 MG tablet Commonly known as: ZESTRIL Take 1 tablet (20 mg total) by mouth daily.   mirabegron ER 50 MG Tb24 tablet Commonly known as: MYRBETRIQ Take 1 tablet (50 mg total) by mouth daily.   multivitamin with minerals Tabs tablet Take 1 tablet by mouth daily.   omeprazole 40 MG capsule Commonly known as: PRILOSEC Take 40 mg by mouth daily.   pantoprazole 40 MG tablet Commonly known as: PROTONIX Take 40 mg by mouth daily.   sucralfate 1  g tablet Commonly known as: CARAFATE Take 1 g by mouth 2 (two) times daily.        Allergies:  Allergies  Allergen Reactions   Arava [Leflunomide] Swelling    Swelling tongue and ulcers in mouth    Methotrexate Derivatives Swelling    Tongue swelling   Statins Other (See Comments)    BLOATED FEELING, CHEST FULLNESS    Family History: Family History  Problem Relation Age of Onset   Breast cancer Other    Breast cancer Mother     Social History:  reports that she has never smoked. She has never used smokeless tobacco. She reports that she does not drink alcohol and does not use drugs.  ROS: Pertinent ROS in HPI  Physical Exam: BP (!) 185/78   Pulse 67   Wt 151 lb (68.5 kg)   BMI 21.67 kg/m   Constitutional:  Well nourished. Alert and oriented, No acute distress. HEENT: [place] AT, moist mucus membranes.  Trachea midline Cardiovascular: No  clubbing, cyanosis, or edema. Respiratory: Normal respiratory effort, no increased work of breathing.  Neurologic: Grossly intact, no focal deficits, moving all 4 extremities. Psychiatric: Normal mood and affect.    Laboratory Data: Lab Results  Component Value Date   CREATININE 0.80 05/01/2022   Urinalysis Results for orders placed or performed in visit on 12/06/22  CULTURE, URINE COMPREHENSIVE   Specimen: Urine   UR  Result Value Ref Range   Urine Culture, Comprehensive Preliminary report (A)    Organism ID, Bacteria Gram negative rods (A)   Microscopic Examination   Urine  Result Value Ref Range   WBC, UA 6-10 (A) 0 - 5 /hpf   RBC, Urine 0-2 0 - 2 /hpf   Epithelial Cells (non renal) 0-10 0 - 10 /hpf   Casts Present (A) None seen /lpf   Cast Type Hyaline casts N/A   Mucus, UA Present (A) Not Estab.   Bacteria, UA Moderate (A) None seen/Few  Urinalysis, Complete  Result Value Ref Range   Specific Gravity, UA >1.030 (H) 1.005 - 1.030   pH, UA 6.0 5.0 - 7.5   Color, UA Yellow Yellow   Appearance Ur Clear Clear   Leukocytes,UA Negative Negative   Protein,UA 3+ (A) Negative/Trace   Glucose, UA Negative Negative   Ketones, UA Negative Negative   RBC, UA Negative Negative   Bilirubin, UA Negative Negative   Urobilinogen, Ur 0.2 0.2 - 1.0 mg/dL   Nitrite, UA Negative Negative   Microscopic Examination See below:     I have reviewed the labs.   Pertinent Imaging: N/A  Assessment & Plan:    1. Suspected UTI -UA with bacteriuria -urine culture sent -I have sent in Ceftin 500 mg twice daily empirically and will adjust if necessary once urine culture results are available  2. RCC/Complex cysts  -patient has appointment in November with Dr. Lonna Cobb   3. Mixed incontinence -She will continue the Myrbetriq 50 mg daily for now, I will have her return in 1 month to reassess her incontinence.  It may be that she does have an urinary tract infection and once this is  addressed her incontinence return to baseline, but if the urine culture is negative or incontinence does not improve with antibiotic use, we will need to pursue other etiologies    Return in about 1 month (around 01/06/2023) for PVR and OAB questionnaire.  These notes generated with voice recognition software. I apologize for typographical errors.  Germaine Shenker,  PA-C  [facility] [address]  [address] [place], [place] [address] [phone]

## 2022-12-05 NOTE — Telephone Encounter (Signed)
Pt calls triage line and states that would like an appointment for a UTI. She states she is having dysuria and urinary frequency x 2 days. Denies fever or chills. Pt scheduled in next available slot.

## 2022-12-06 ENCOUNTER — Encounter: Payer: Self-pay | Admitting: Urology

## 2022-12-06 ENCOUNTER — Ambulatory Visit: Payer: Medicare Other | Admitting: Urology

## 2022-12-06 VITALS — BP 185/78 | HR 67 | Wt 151.0 lb

## 2022-12-06 DIAGNOSIS — N3946 Mixed incontinence: Secondary | ICD-10-CM | POA: Diagnosis not present

## 2022-12-06 DIAGNOSIS — N281 Cyst of kidney, acquired: Secondary | ICD-10-CM | POA: Diagnosis not present

## 2022-12-06 DIAGNOSIS — R3989 Other symptoms and signs involving the genitourinary system: Secondary | ICD-10-CM

## 2022-12-06 DIAGNOSIS — R8271 Bacteriuria: Secondary | ICD-10-CM

## 2022-12-06 LAB — URINALYSIS, COMPLETE
Bilirubin, UA: NEGATIVE
Glucose, UA: NEGATIVE
Ketones, UA: NEGATIVE
Leukocytes,UA: NEGATIVE
Nitrite, UA: NEGATIVE
RBC, UA: NEGATIVE
Specific Gravity, UA: >1.03 — ABNORMAL HIGH (ref 1.005–1.030)
Urobilinogen, Ur: 0.2 mg/dL (ref 0.2–1.0)
pH, UA: 6 (ref 5.0–7.5)

## 2022-12-06 LAB — MICROSCOPIC EXAMINATION

## 2022-12-06 MED ORDER — CEFUROXIME AXETIL 500 MG PO TABS: 500.0000 mg | ORAL_TABLET | Freq: Two times a day (BID) | ORAL | 0 refills | Status: DC

## 2023-01-06 NOTE — Progress Notes (Unsigned)
01/07/2023 9:15 AM   Vanessa Esparza March 02, 1941 [id number]  Referring provider: Jerl Mina, MD [address] [place],  [place] [address]  Urological history: 1. pT1a renal cell carcinoma -Left partial nephrectomy 05/2011 at [facility] grade 2; clear-cell -CTU (04/2022) - Unchanged appearance of the posterior midportion of the left kidney status post partial nephrectomy.  Just inferior to the partial nephrectomy site is an exophytic oval lesion measuring 1.7 x 1.0 cm. Although of intermediate attenuation, there is no evidence of contrast enhancement. This is most likely postoperative scarring or residual of an involuted cyst.  Although very likely benign, continued attention on follow-up is warranted.  Additional complex, partially rim calcified exophytic lesion of the inferior pole of the right kidney is unchanged, measuring 1.0 x 1.0 cm. This is likewise of intermediate attenuation and without evidence of contrast enhancement. This is also most likely sequelae of an involuted cyst, although as above, attention on follow-up is warranted given complexity.   2.  Overactive bladder with urge incontinence -Contributing factors of age, GSM, hypertension and diabetes -cysto (01/2022) - NED  -Myrbetriq 50 mg daily  3. High risk hematuria -non-smoker -hx of RCC - see #1 -CTU (04/2022) - see #1 -cysto (01/2022) - NED   No chief complaint on file.  HPI: Vanessa Esparza is a 82 y.o. female who presents today for follow up.   Previous records reviewed.   At her visit on 12/06/2022, she has a history of mixed urinary continence which she manages with Myrbetriq 50 mg daily and depends daily.  She has noted though over the last week her frequency of incontinence and the volume of incontinence has worsened.  Patient denies any modifying or aggravating factors.  Patient denies any recent UTI's, gross hematuria, dysuria or suprapubic/flank pain.  Patient denies any  fevers, chills, nausea or vomiting.  UA yellow clear, specific gravity greater than 1.030, pH 6, 3+ protein, 6-10 WBCs, 0-2 RBCs, 0-10 epithelial cells, hyaline casts present, granular casts present mucus threads present and moderate bacteria.  Urine culture was grew out MUF.       PMH: Past Medical History:  Diagnosis Date   Anemia    Arthritis    Collagen vascular disease (HCC)    Rhematoid Arthritis   Diabetes mellitus without complication (HCC)    GERD (gastroesophageal reflux disease)    Gout    Hyperlipidemia    Hypertension    Hypothyroidism    Meningioma (HCC)    Psoriasis    Renal cell carcinoma, left (HCC) 05/2011   Left Partial Nephrectomy   Seizure (HCC)    Seizures (HCC)    LAST SEIZURE ON 02-08-09    Surgical History: Past Surgical History:  Procedure Laterality Date   ABDOMINAL HYSTERECTOMY     APPENDECTOMY     BACK SURGERY     LUMBAR   BRAIN MENINGIOMA EXCISION     BRAIN SURGERY     CRANIOTOMY WITH EXCISION OF SUPRATENTORIAL MENINGIOMA   COLONOSCOPY     COLONOSCOPY WITH PROPOFOL N/A 01/28/2017   Procedure: COLONOSCOPY WITH PROPOFOL;  Surgeon: Christena Deem, MD;  Location[facility] END[facility]COPY;  Service: Endoscopy;  Laterality: N/A;   COLONOSCOPY WITH PROPOFOL N/A 01/01/2021   Procedure: COLONOSCOPY WITH PROPOFOL;  Surgeon: Jaynie Collins, DO;  Location: [facility] ENDOSCOPY;  Service: Gastroenterology;  Laterality: N/A;   CRANIOTOMY     w/ excision supratentorial meningioma   ESOPHAGOGASTRODUODENOSCOPY     ESOPHAGOGASTRODUODENOSCOPY (EGD) WITH PROPOFOL  N/A 01/28/2017   Procedure: ESOPHAGOGASTRODUODENOSCOPY (EGD) WITH PROPOFOL;  Surgeon: Christena Deem, MD;  Location: [facility] ENDOSCOPY;  Service: Endoscopy;  Laterality: N/A;   ESOPHAGOGASTRODUODENOSCOPY (EGD) WITH PROPOFOL N/A 01/01/2021   Procedure: ESOPHAGOGASTRODUODENOSCOPY (EGD) WITH PROPOFOL;  Surgeon: Jaynie Collins, DO;  Location: [facility] ENDOSCOPY;  Service: Gastroenterology;  Laterality: N/A;    HEMORRHOID SURGERY     KNEE ARTHROSCOPY Right 09/21/2015   Procedure: ARTHROSCOPY KNEE, LATERAL AND MEDIAL MENISECTOMY;  Surgeon: Kennedy Bucker, MD;  Location: [facility] ORS;  Service: Orthopedics;  Laterality: Right;   LAPAROSCOPIC BILATERAL SALPINGO OOPHERECTOMY Bilateral 04/12/2019   Procedure: LAPAROSCOPIC BILATERAL SALPINGO OOPHORECTOMY;  Surgeon: Ward, Elenora Fender, MD;  Location: [facility] ORS;  Service: Gynecology;  Laterality: Bilateral;   PARTIAL NEPHRECTOMY Left    DUE TO RENAL CELL CARCINOMA   REDUCTION MAMMAPLASTY Bilateral    THYROID SURGERY     TOTAL KNEE ARTHROPLASTY Right 06/30/2018   Procedure: TOTAL KNEE ARTHROPLASTY-RIGHT;  Surgeon: Kennedy Bucker, MD;  Location: [facility] ORS;  Service: Orthopedics;  Laterality: Right;    Home Medications:  Allergies as of 01/07/2023       Reactions   Arava [leflunomide] Swelling   Swelling tongue and ulcers in mouth    Methotrexate Derivatives Swelling   Tongue swelling   Statins Other (See Comments)   BLOATED FEELING, CHEST FULLNESS        Medication List        Accurate as of January 06, 2023  9:15 AM. If you have any questions, ask your nurse or doctor.          calcium carbonate 600 MG Tabs tablet Commonly known as: OS-CAL Take 600 mg by mouth daily.   cefUROXime 500 MG tablet Commonly known as: CEFTIN Take 1 tablet (500 mg total) by mouth 2 (two) times daily with a meal.   feeding supplement Liqd Take 237 mLs by mouth 3 (three) times daily between meals.   fexofenadine 180 MG tablet Commonly known as: ALLEGRA Take 180 mg by mouth daily.   Fluocinolone Acetonide Scalp 0.01 % Oil Apply topically.   fluocinonide 0.05 % external solution Commonly known as: LIDEX Apply topically 2 (two) times daily as needed.   glipiZIDE 5 MG 24 hr tablet Commonly known as: GLUCOTROL XL Take 5 mg by mouth daily.   glucose blood test strip USE 1 STRIP TO CHECK GLUCOSE TWICE DAILY   levothyroxine 125 MCG tablet Commonly known as:  SYNTHROID Take 125 mcg by mouth daily before breakfast.   lisinopril 20 MG tablet Commonly known as: ZESTRIL Take 1 tablet (20 mg total) by mouth daily.   mirabegron ER 50 MG Tb24 tablet Commonly known as: MYRBETRIQ Take 1 tablet (50 mg total) by mouth daily.   multivitamin with minerals Tabs tablet Take 1 tablet by mouth daily.   omeprazole 40 MG capsule Commonly known as: PRILOSEC Take 40 mg by mouth daily.   pantoprazole 40 MG tablet Commonly known as: PROTONIX Take 40 mg by mouth daily.   sucralfate 1 g tablet Commonly known as: CARAFATE Take 1 g by mouth 2 (two) times daily.        Allergies:  Allergies  Allergen Reactions   Arava [Leflunomide] Swelling    Swelling tongue and ulcers in mouth    Methotrexate Derivatives Swelling    Tongue swelling   Statins Other (See Comments)    BLOATED FEELING, CHEST FULLNESS    Family History: Family History  Problem Relation Age of Onset   Breast cancer Other  Breast cancer Mother     Social History:  reports that she has never smoked. She has never used smokeless tobacco. She reports that she does not drink alcohol and does not use drugs.  ROS: Pertinent ROS in HPI  Physical Exam: There were no vitals taken for this visit.  Constitutional:  Well nourished. Alert and oriented, No acute distress. GU: No CVA tenderness.  No bladder fullness or masses.  Patient with circumcised/uncircumcised phallus. ***Foreskin easily retracted***  Urethral meatus is patent.  No penile discharge. No penile lesions or rashes. Scrotum without lesions, cysts, rashes and/or edema.  Testicles are located scrotally bilaterally. No masses are appreciated in the testicles. Left and right epididymis are normal. Rectal: Patient with  normal sphincter tone. Anus and perineum without scarring or rashes. No rectal masses are appreciated. Prostate is approximately *** grams, *** nodules are appreciated. Seminal vesicles are normal.   Laboratory  Data: N/A  Pertinent Imaging: N/A  Assessment & Plan:    1. RCC/Complex cysts  -patient has appointment in November with Dr. Lonna Cobb   2. Mixed incontinence -She will continue the Myrbetriq 50 mg daily for now, I will have her return in 1 month to reassess her incontinence.  It may be that she does have an urinary tract infection and once this is addressed her incontinence return to baseline, but if the urine culture is negative or incontinence does not improve with antibiotic use, we will need to pursue other etiologies    No follow-ups on file.  These notes generated with voice recognition software. I apologize for typographical errors.  Cloretta Ned  [facility] [address]  [address] [place], [place] [address] [phone]

## 2023-01-07 ENCOUNTER — Ambulatory Visit: Payer: Medicare Other | Admitting: Urology

## 2023-01-07 ENCOUNTER — Encounter: Payer: Self-pay | Admitting: Urology

## 2023-01-07 VITALS — BP 186/71 | HR 65

## 2023-01-07 DIAGNOSIS — N281 Cyst of kidney, acquired: Secondary | ICD-10-CM

## 2023-01-07 DIAGNOSIS — N3946 Mixed incontinence: Secondary | ICD-10-CM | POA: Diagnosis not present

## 2023-01-10 ENCOUNTER — Telehealth: Payer: Self-pay

## 2023-01-10 NOTE — Telephone Encounter (Signed)
Harle Battiest, PA-C  Noemie Devivo V, CMA Please obtain a prior authorization for her and get her scheduled for her 12 weekly treatments  NO PA is required per insurance. Patient advised. She will have a $15 co pay for each visit.  Appointments scheduled.

## 2023-01-24 ENCOUNTER — Ambulatory Visit: Payer: Medicare Other | Admitting: Physician Assistant

## 2023-01-24 VITALS — BP 180/68 | HR 67

## 2023-01-24 DIAGNOSIS — N3946 Mixed incontinence: Secondary | ICD-10-CM

## 2023-01-24 NOTE — Patient Instructions (Signed)

## 2023-01-24 NOTE — Progress Notes (Signed)
PTNS  Session # 1  Health & Social Factors: no change Caffeine: 2 Alcohol: 0 Daytime voids #per day: Q2-3 Night-time voids #per night: 3 Urgency: strong Incontinence Episodes #per day: 2-3 Ankle used: right Treatment Setting: 7 Feeling/ Response: sensory Comments: Patient tolerated well. Consent signed.  Performed By: Carman Ching, PA-C   Follow Up: 1 week for PTNS #2

## 2023-01-31 ENCOUNTER — Ambulatory Visit: Payer: Medicare Other | Admitting: Physician Assistant

## 2023-01-31 DIAGNOSIS — N3946 Mixed incontinence: Secondary | ICD-10-CM | POA: Diagnosis not present

## 2023-01-31 NOTE — Patient Instructions (Signed)

## 2023-01-31 NOTE — Progress Notes (Signed)
PTNS  Session # 2  Health & Social Factors: no change Caffeine: 1 Alcohol: 0 Daytime voids #per day: Q2 Night-time voids #per night: 2-3 Urgency: severe Incontinence Episodes #per day: 3-4 Ankle used: left Treatment Setting: 16 Feeling/ Response: sensory Comments: Patient tolerated well.  Performed By: Carman Ching, PA-C   Follow Up: 1 week for PTNS #3

## 2023-02-05 NOTE — Progress Notes (Unsigned)
PTNS ? ?Session # *** ? ?Health & Social Factors: *** ?Caffeine: *** ?Alcohol: *** ?Daytime voids #per day: *** ?Night-time voids #per night: *** ?Urgency: *** ?Incontinence Episodes #per day: *** ?Ankle used: *** ?Treatment Setting: *** ?Feeling/ Response: *** ?Comments: *** ? ?Performed By: *** ? ?Follow Up: *** ? ?

## 2023-02-06 ENCOUNTER — Ambulatory Visit: Payer: Medicare Other | Admitting: Urology

## 2023-02-06 ENCOUNTER — Encounter: Payer: Self-pay | Admitting: Urology

## 2023-02-06 VITALS — BP 173/69 | HR 60

## 2023-02-06 DIAGNOSIS — N3946 Mixed incontinence: Secondary | ICD-10-CM

## 2023-02-14 ENCOUNTER — Ambulatory Visit: Payer: Medicare Other | Admitting: Physician Assistant

## 2023-02-14 DIAGNOSIS — N3946 Mixed incontinence: Secondary | ICD-10-CM | POA: Diagnosis not present

## 2023-02-14 NOTE — Progress Notes (Signed)
PTNS  Session # 4  Health & Social Factors: No change Caffeine: 1 Alcohol: 0 Daytime voids #per day: Q2 Night-time voids #per night: 3 Urgency: strong Incontinence Episodes #per day: 1 Ankle used: left Treatment Setting: 2 Feeling/ Response: sensory Comments: pt tolerated well, has started to notice improvement  Performed By: Carman Ching, PA-C   Follow Up: 1 week for PTNS #5

## 2023-02-21 ENCOUNTER — Ambulatory Visit: Payer: Medicare Other | Admitting: Physician Assistant

## 2023-02-21 ENCOUNTER — Encounter: Payer: Self-pay | Admitting: Physician Assistant

## 2023-02-21 VITALS — Ht 70.0 in | Wt 151.0 lb

## 2023-02-21 DIAGNOSIS — N3946 Mixed incontinence: Secondary | ICD-10-CM

## 2023-02-21 NOTE — Patient Instructions (Signed)

## 2023-02-21 NOTE — Progress Notes (Signed)
PTNS  Session # 5  Health & Social Factors: No change Caffeine: 1 Alcohol: 0 Daytime voids #per day: Q2-3 Night-time voids #per night: 2-3 Urgency: strong Incontinence Episodes #per day: strong Ankle used: right Treatment Setting: 13 Feeling/ Response: sensory Comments: Patient tolerated well  Performed By: Carman Ching, PA-C   Follow Up: 1 week

## 2023-02-28 ENCOUNTER — Ambulatory Visit: Payer: Medicare Other | Admitting: Physician Assistant

## 2023-02-28 DIAGNOSIS — N3946 Mixed incontinence: Secondary | ICD-10-CM

## 2023-02-28 NOTE — Patient Instructions (Signed)

## 2023-02-28 NOTE — Progress Notes (Signed)
PTNS  Session # 6  Health & Social Factors: no change Caffeine: 1-2 Alcohol: 0 Daytime voids #per day: 6-7 Night-time voids #per night: 3-4 Urgency: strong Incontinence Episodes #per day: 4-5 Ankle used: left Treatment Setting: 8 Feeling/ Response: sensory Comments: Patient tolerated well. Worse nocturia and leakage this week.  Performed By: Carman Ching, PA-C   Follow Up: 1 week for PTNS #7

## 2023-03-07 ENCOUNTER — Ambulatory Visit: Payer: Medicare Other | Admitting: Physician Assistant

## 2023-03-07 DIAGNOSIS — N3946 Mixed incontinence: Secondary | ICD-10-CM | POA: Diagnosis not present

## 2023-03-07 NOTE — Patient Instructions (Signed)

## 2023-03-07 NOTE — Progress Notes (Signed)
PTNS  Session # 7  Health & Social Factors: no change Caffeine: 1 Alcohol: 0 Daytime voids #per day: Q2 Night-time voids #per night: Q3 Urgency: severe Incontinence Episodes #per day: 1 Ankle used: right Treatment Setting: 1 Feeling/ Response: sensory Comments: Patient tolerated well.  Performed By: Carman Ching, PA-C   Follow Up: 1 week

## 2023-03-14 ENCOUNTER — Ambulatory Visit: Payer: Medicare Other | Admitting: Physician Assistant

## 2023-03-14 DIAGNOSIS — N3281 Overactive bladder: Secondary | ICD-10-CM

## 2023-03-14 NOTE — Patient Instructions (Signed)

## 2023-03-14 NOTE — Progress Notes (Signed)
PTNS  Session # 8  Health & Social Factors: no change Caffeine: 1 Alcohol: 0 Daytime voids #per day: 5 Night-time voids #per night: 3 Urgency: mild Incontinence Episodes #per day: 1-2 Ankle used: left Treatment Setting: 11 Feeling/ Response: sensory Comments: Patient tolerated well.  Performed By: Carman Ching, PA-C   Follow Up: 1 week

## 2023-03-17 ENCOUNTER — Ambulatory Visit
Admission: RE | Admit: 2023-03-17 | Discharge: 2023-03-17 | Disposition: A | Discharge status: Home / Self Care | Payer: Medicare Other | Source: Ambulatory Visit | Attending: Urology | Admitting: Urology

## 2023-03-17 DIAGNOSIS — N281 Cyst of kidney, acquired: Secondary | ICD-10-CM | POA: Diagnosis present

## 2023-03-17 DIAGNOSIS — Z85528 Personal history of other malignant neoplasm of kidney: Secondary | ICD-10-CM | POA: Insufficient documentation

## 2023-03-17 MED ORDER — IOHEXOL 300 MG/ML  SOLN
100.0000 mL | Freq: Once | INTRAMUSCULAR | Status: AC | PRN
  Administered 2023-03-17: 100 mL via INTRAVENOUS

## 2023-03-21 ENCOUNTER — Ambulatory Visit: Payer: Medicare Other | Admitting: Physician Assistant

## 2023-03-21 DIAGNOSIS — N3281 Overactive bladder: Secondary | ICD-10-CM | POA: Diagnosis not present

## 2023-03-21 NOTE — Progress Notes (Signed)
PTNS  Session # 9  Health & Social Factors: no change Caffeine: 1 Alcohol: 0 Daytime voids #per day: 4 Night-time voids #per night: 2-3 Urgency: mild Incontinence Episodes #per day: strong Ankle used: left Treatment Setting: 11 Feeling/ Response: sensory Comments: Patient tolerated well  Performed By: Carman Ching ,PA   Follow Up: 1 week follow up

## 2023-03-21 NOTE — Patient Instructions (Signed)

## 2023-03-26 NOTE — Progress Notes (Signed)
PTNS  Session # 10   Health & Social Factors: no change Caffeine: 2 Alcohol: 0 Daytime voids #per day: 4-5 Night-time voids #per night: 2 Urgency: strong Incontinence Episodes #per day: 2 Ankle used: right Treatment Setting: 19 Feeling/ Response: sensory Comments:   Performed By: Jearld Pies RMA  Follow Up: 1 week for #11 PTNS

## 2023-03-28 ENCOUNTER — Ambulatory Visit: Payer: Medicare Other | Admitting: Urology

## 2023-03-28 DIAGNOSIS — N3281 Overactive bladder: Secondary | ICD-10-CM | POA: Diagnosis not present

## 2023-03-28 NOTE — Patient Instructions (Signed)

## 2023-04-02 NOTE — Progress Notes (Signed)
PTNS  Session # 11  Health & Social Factors: No changes  Caffeine: 1 Alcohol: 0 Daytime voids #per day: 6 Night-time voids #per night: 3 Urgency: strong Incontinence Episodes #per day: 2 Ankle used: Left Treatment Setting: 7 Feeling/ Response: Sensory  Comments: Patient tolerated procedure  Performed By: Michiel Cowboy, PA-C   Follow Up: 1 week for number 12 out of 12 weekly PTNS is  She also had a follow-up CT scan for surveillance of her renal cell carcinoma after undergoing a left partial nephrectomy.  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  No follow-up was recommended.  She will be scheduled to see Dr. Lonna Cobb in 1 year for surveillance CT.

## 2023-04-04 ENCOUNTER — Ambulatory Visit: Payer: Medicare Other | Admitting: Urology

## 2023-04-04 DIAGNOSIS — N3281 Overactive bladder: Secondary | ICD-10-CM | POA: Diagnosis not present

## 2023-04-04 IMAGING — DX DG CHEST 1V PORT
1 series · 1 of 1 positions shown · non-contrast
Comparison: 02/09/2017

CLINICAL DATA: weakness, eval for infiltrate

EXAM:
PORTABLE CHEST 1 VIEW

[chest ap]
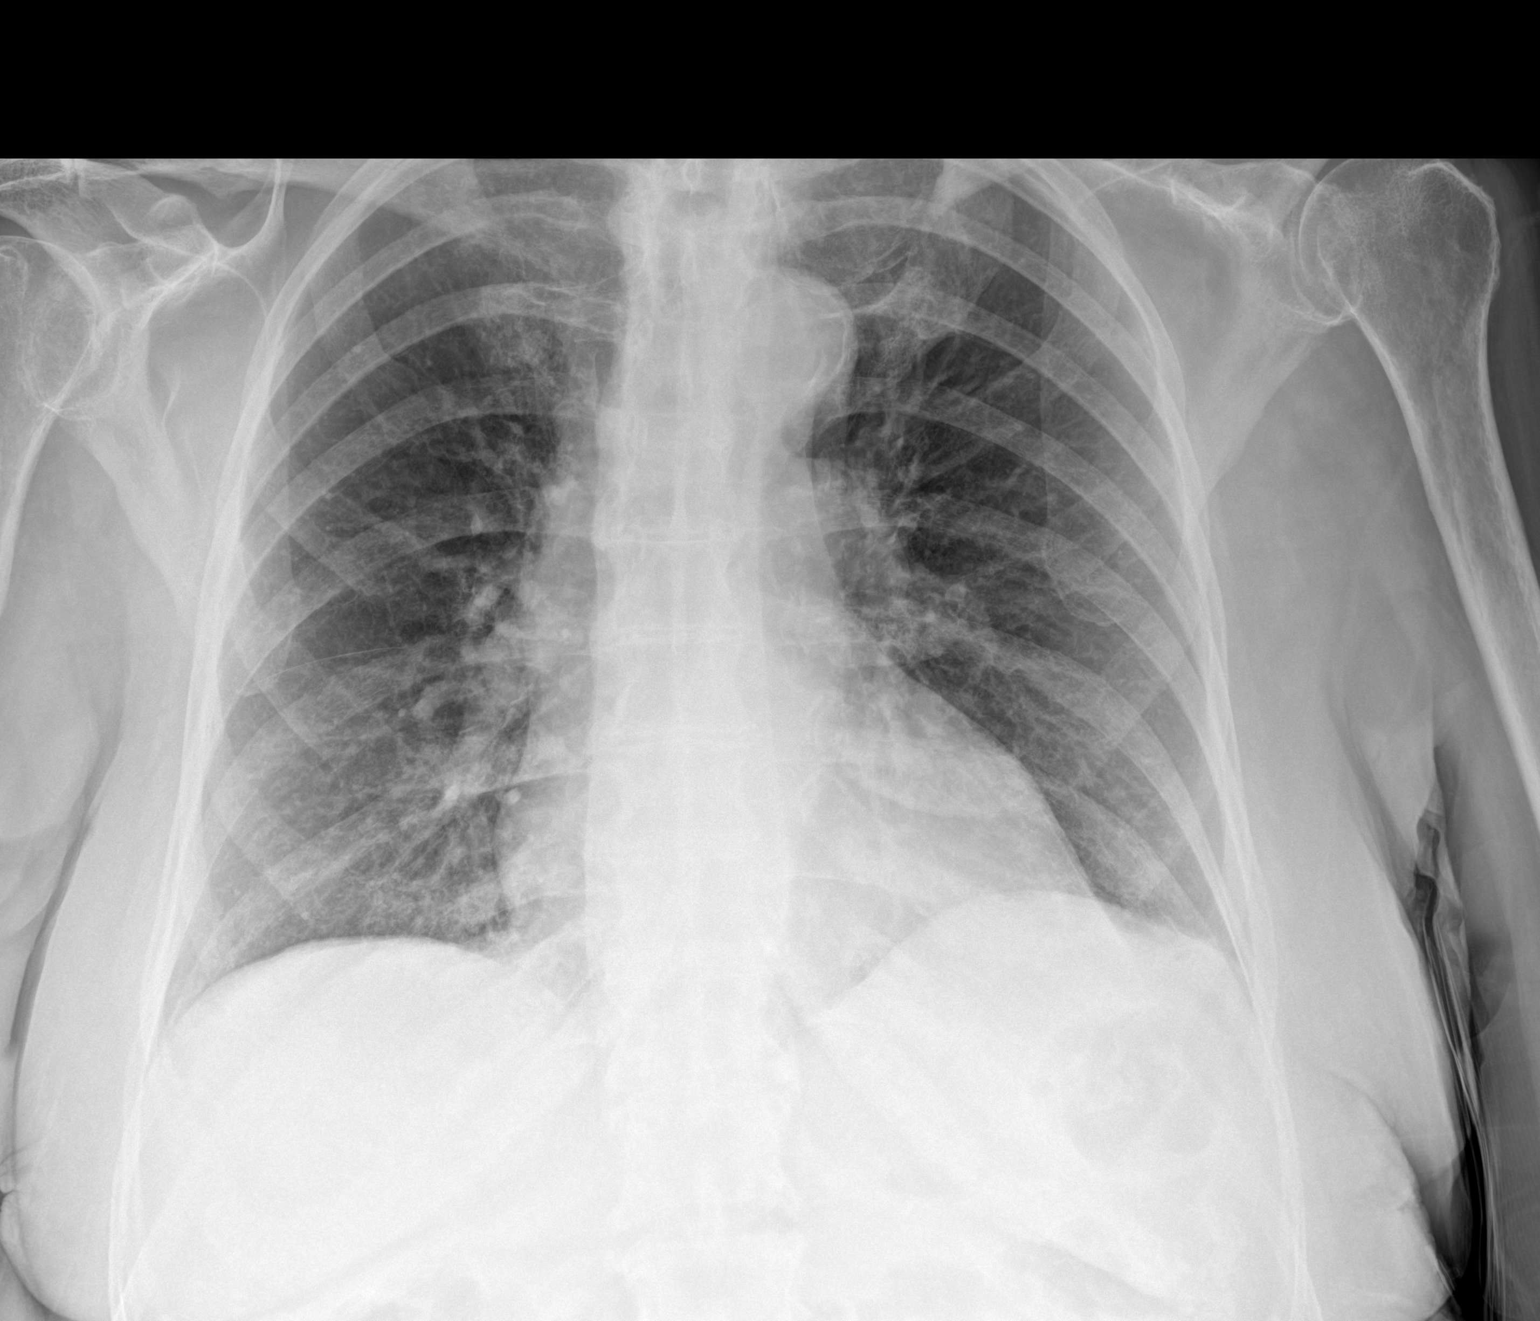

[1 of 1 positions shown; findings below may reference images not displayed]

FINDINGS: The heart size and mediastinal contours are within normal limits.
Atherosclerotic calcification of the aortic knob. Mildly prominent
bibasilar interstitial markings is similar in appearance to prior.
No focal airspace consolidation, pleural effusion, or pneumothorax.
Degenerative changes of the bilateral AC joints.
IMPRESSION: No active disease.

## 2023-04-04 NOTE — Patient Instructions (Signed)

## 2023-04-07 NOTE — Progress Notes (Unsigned)
PTNS  Session # 12  Health & Social Factors: no change Caffeine: 1 cup Alcohol: 0 Daytime voids #per day: 6-7 Night-time voids #per night: 2-3 Urgency: strong Incontinence Episodes #per day: 2-3 Ankle used: right Treatment Setting: 5 Feeling/ Response: sensory Comments: pt tolerated well  Performed By: Randa Lynn, RMA  Follow Up: 1 month

## 2023-04-09 ENCOUNTER — Ambulatory Visit: Payer: Medicare Other | Admitting: Urology

## 2023-04-09 DIAGNOSIS — N3281 Overactive bladder: Secondary | ICD-10-CM

## 2023-05-01 NOTE — Progress Notes (Signed)
05/02/2023 11:20 AM   Vanessa Esparza Aug 24, 1940 [id number]  Referring provider: Jerl Mina, MD [address] [place],  [place] [address]  Urological history: 1. 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  Overactive bladder with urge incontinence -Myrbetriq 50 mg daily  3. High risk hematuria -non-smoker -hx of RCC - see #1 -CTU (04/2022) - see #1 -cysto (01/2022) - NED     Chief Complaint  Patient presents with   Follow-up    Discuss PTNS   HPI: Vanessa Esparza is a 82 y.o. female who presents today for follow up after PTNS.    Previous records reviewed.   She said the PTNS " was not worth a flip and was a complete waist of time."  She noticed no benefit after undergoing 12 weekly treatments.  She states she still has the same amount of incontinence, frequency and urgency.  She continues to take Myrbetriq 50 mg daily.  She did feel the Myrbetriq is helpful either.  She also did not find the Gemtesa helpful.    Patient denies any modifying or aggravating factors.  Patient denies any recent UTI's, gross hematuria, dysuria or suprapubic/flank pain.  Patient denies any fevers, chills, nausea or vomiting.    PMH: Past Medical History:  Diagnosis Date   Anemia    Arthritis    Collagen vascular disease (HCC)    Rhematoid Arthritis   Diabetes mellitus without complication (HCC)    GERD (gastroesophageal reflux disease)    Gout    Hyperlipidemia    Hypertension    Hypothyroidism    Meningioma (HCC)    Psoriasis    Renal cell carcinoma, left (HCC) 05/2011   Left Partial Nephrectomy   Seizure (HCC)    Seizures (HCC)    LAST SEIZURE ON 02-08-09    Surgical History: Past Surgical History:  Procedure Laterality Date   ABDOMINAL HYSTERECTOMY     APPENDECTOMY     BACK SURGERY     LUMBAR   BRAIN MENINGIOMA EXCISION     BRAIN SURGERY     CRANIOTOMY WITH EXCISION OF  SUPRATENTORIAL MENINGIOMA   COLONOSCOPY     COLONOSCOPY WITH PROPOFOL N/A 01/28/2017   Procedure: COLONOSCOPY WITH PROPOFOL;  Surgeon: Christena Deem, MD;  Location: [facility] ENDOSCOPY;  Service: Endoscopy;  Laterality: N/A;   COLONOSCOPY WITH PROPOFOL N/A 01/01/2021   Procedure: COLONOSCOPY WITH PROPOFOL;  Surgeon: Jaynie Collins, DO;  Location: [facility] ENDOSCOPY;  Service: Gastroenterology;  Laterality: N/A;   CRANIOTOMY     w/ excision supratentorial meningioma   ESOPHAGOGASTRODUODENOSCOPY     ESOPHAGOGASTRODUODENOSCOPY (EGD) WITH PROPOFOL N/A 01/28/2017   Procedure: ESOPHAGOGASTRODUODENOSCOPY (EGD) WITH PROPOFOL;  Surgeon: Christena Deem, MD;  Location: [facility] ENDOSCOPY;  Service: Endoscopy;  Laterality: N/A;   ESOPHAGOGASTRODUODENOSCOPY (EGD) WITH PROPOFOL N/A 01/01/2021   Procedure: ESOPHAGOGASTRODUODENOSCOPY (EGD) WITH PROPOFOL;  Surgeon: Jaynie Collins, DO;  Location: [facility] ENDOSCOPY;  Service: Gastroenterology;  Laterality: N/A;   HEMORRHOID SURGERY     KNEE ARTHROSCOPY Right 09/21/2015   Procedure: ARTHROSCOPY KNEE, LATERAL AND MEDIAL MENISECTOMY;  Surgeon: Kennedy Bucker, MD;  Location: [facility] ORS;  Service: Orthopedics;  Laterality: Right;   LAPAROSCOPIC BILATERAL SALPINGO OOPHERECTOMY Bilateral 04/12/2019   Procedure: LAPAROSCOPIC BILATERAL SALPINGO OOPHORECTOMY;  Surgeon: Ward, Elenora Fender, MD;  Location: [facility] ORS;  Service: Gynecology;  Laterality: Bilateral;   PARTIAL NEPHRECTOMY Left    DUE TO RENAL CELL CARCINOMA  REDUCTION MAMMAPLASTY Bilateral    THYROID SURGERY     TOTAL KNEE ARTHROPLASTY Right 06/30/2018   Procedure: TOTAL KNEE ARTHROPLASTY-RIGHT;  Surgeon: Kennedy Bucker, MD;  Location: [facility] ORS;  Service: Orthopedics;  Laterality: Right;    Home Medications:  Allergies as of 05/02/2023       Reactions   Arava [leflunomide] Swelling   Swelling tongue and ulcers in mouth    Methotrexate Derivatives Swelling   Tongue swelling   Statins Other (See Comments)    BLOATED FEELING, CHEST FULLNESS        Medication List        Accurate as of May 02, 2023 11:20 AM. If you have any questions, ask your nurse or doctor.          amLODipine 5 MG tablet Commonly known as: NORVASC Take 5 mg by mouth daily.   calcium carbonate 600 MG Tabs tablet Commonly known as: OS-CAL Take 600 mg by mouth daily.   cefUROXime 500 MG tablet Commonly known as: CEFTIN Take 1 tablet (500 mg total) by mouth 2 (two) times daily with a meal.   feeding supplement Liqd Take 237 mLs by mouth 3 (three) times daily between meals.   fexofenadine 180 MG tablet Commonly known as: ALLEGRA Take 180 mg by mouth daily.   Fluocinolone Acetonide Scalp 0.01 % Oil Apply topically.   fluocinonide 0.05 % external solution Commonly known as: LIDEX Apply topically 2 (two) times daily as needed.   glipiZIDE 5 MG 24 hr tablet Commonly known as: GLUCOTROL XL Take 5 mg by mouth daily.   glucose blood test strip USE 1 STRIP TO CHECK GLUCOSE TWICE DAILY   levothyroxine 150 MCG tablet Commonly known as: SYNTHROID Take 150 mcg by mouth daily.   lisinopril 20 MG tablet Commonly known as: ZESTRIL Take 1 tablet (20 mg total) by mouth daily.   mirabegron ER 50 MG Tb24 tablet Commonly known as: MYRBETRIQ Take 1 tablet (50 mg total) by mouth daily.   multivitamin with minerals Tabs tablet Take 1 tablet by mouth daily.   omeprazole 40 MG capsule Commonly known as: PRILOSEC Take 40 mg by mouth daily.        Allergies:  Allergies  Allergen Reactions   Arava [Leflunomide] Swelling    Swelling tongue and ulcers in mouth    Methotrexate Derivatives Swelling    Tongue swelling   Statins Other (See Comments)    BLOATED FEELING, CHEST FULLNESS    Family History: Family History  Problem Relation Age of Onset   Breast cancer Other    Breast cancer Mother     Social History:  reports that she has never smoked. She has never been exposed to tobacco smoke.  She has never used smokeless tobacco. She reports that she does not drink alcohol and does not use drugs.  ROS: Pertinent ROS in HPI  Physical Exam: BP (!) 180/74   Pulse 75   Ht 5\' 9"  (1.753 m)   Wt 151 lb (68.5 kg)   BMI 22.30 kg/m   Constitutional:  Well nourished. Alert and oriented, No acute distress. HEENT: [place] AT, moist mucus membranes.  Trachea midline Cardiovascular: No clubbing, cyanosis, or edema. Respiratory: Normal respiratory effort, no increased work of breathing. Neurologic: Grossly intact, no focal deficits, moving all 4 extremities. Psychiatric: Normal mood and affect.  Laboratory Data: Hemoglobin A1C Order: [id number] Component Ref Range & Units 1 mo ago  Hemoglobin A1C 4.2 - 5.6 % 5.9 High   Average Blood  Glucose (Calc) mg/dL 782  Resulting Agency [facility]  Narrative Performed by [facility] - LAB Normal Range:    4.2 - 5.6% Increased Risk:  5.7 - 6.4% Diabetes:        >= 6.5% Glycemic Control for adults with diabetes:  <7%    Specimen Collected: 03/31/23 08:48   Performed by: [facility] Last Resulted: 03/31/23 13:17  Received From: [facility]  Result Received: 04/01/23 01:02   Comprehensive Metabolic Panel (CMP) Order: [id number] Component Ref Range & Units 1 mo ago  Glucose 70 - 110 mg/dL 578  Sodium 469 - 629 mmol/L 142  Potassium 3.6 - 5.1 mmol/L 4.4  Chloride 97 - 109 mmol/L 104  Carbon Dioxide (CO2) 22.0 - 32.0 mmol/L 31.9  Urea Nitrogen (BUN) 7 - 25 mg/dL 18  Creatinine 0.6 - 1.1 mg/dL 0.8  Glomerular Filtration Rate (eGFR) >60 mL/min/1.73sq m 74  Comment: CKD-EPI (2021) does not include patient's race in the calculation of eGFR.  Monitoring changes of plasma creatinine and eGFR over time is useful for monitoring kidney function.  Interpretive Ranges for eGFR (CKD-EPI 2021):  eGFR:       >60 mL/min/1.73 sq. m - Normal eGFR:       30-59 mL/min/1.73 sq. m - Moderately  Decreased eGFR:       15-29 mL/min/1.73 sq. m  - Severely Decreased eGFR:       < 15 mL/min/1.73 sq. m  - Kidney Failure   Note: These eGFR calculations do not apply in acute situations when eGFR is changing rapidly or patients on dialysis.  Calcium 8.7 - 10.3 mg/dL 9.8  AST 8 - 39 U/L 20  ALT 5 - 38 U/L 17  Alk Phos (alkaline Phosphatase) 34 - 104 U/L 65  Albumin 3.5 - 4.8 g/dL 3.8  Bilirubin, Total 0.3 - 1.2 mg/dL 0.4  Protein, Total 6.1 - 7.9 g/dL 5.8 Low   A/G Ratio 1.0 - 5.0 gm/dL 1.9  Resulting Agency [facility]   Specimen Collected: 03/31/23 08:48   Performed by: [facility] Last Resulted: 03/31/23 17:42  Received From: [facility]  Result Received: 04/01/23 01:02    Lipid Panel w/calc LDL Order: [id number] Component Ref Range & Units 1 mo ago  Cholesterol, Total 100 - 200 mg/dL 010 High   Triglyceride 35 - 199 mg/dL 272  HDL (High Density Lipoprotein) Cholesterol 35.0 - 85.0 mg/dL 53.6  LDL Calculated 0 - 130 mg/dL 644 High   VLDL Cholesterol mg/dL 30  Cholesterol/HDL Ratio 4.5  Resulting Agency [facility]   Specimen Collected: 03/31/23 08:48   Performed by: [facility] Last Resulted: 03/31/23 17:43  Received From: [facility]  Result Received: 04/01/23 01:02   CBC w/auto Differential (3 Part) Order: [id number] Component Ref Range & Units 1 mo ago  WBC (White Blood Cell Count) 4.1 - 10.2 10^3/uL 4.2  RBC (Red Blood Cell Count) 4.04 - 5.48 10^6/uL 4.38  Hemoglobin 12.0 - 15.0 gm/dL 63.8 Low   Hematocrit 75.6 - 47.0 % 36  MCV (Mean Corpuscular Volume) 80.0 - 100.0 fl 82.2  MCH (Mean Corpuscular Hemoglobin) 27.0 - 31.2 pg 26 Low   MCHC (Mean Corpuscular Hemoglobin Concentration) 32.0 - 36.0 gm/dL 43.3 Low   Platelet Count 150 - 450 10^3/uL 293  RDW-CV (Red Cell Distribution Width) 11.6 - 14.8 % 13  MPV (Mean Platelet  Volume) 9.4 - 12.4 fl 9.5   Neutrophils 1.50 - 7.80 10^3/uL 2.9  Lymphocytes 1.00 - 3.60 10^3/uL 1  Mixed Count 0.10 - 0.90 10^3/uL 0.3  Neutrophil % 32.0 - 70.0 % 68.2  Lymphocyte % 10.0 - 50.0 % 24.3  Mixed % 3.0 - 14.4 % 7.5  Resulting Agency [facility]   Specimen Collected: 03/31/23 08:48   Performed by: [facility] Last Resulted: 03/31/23 09:42  Received From: [facility]  Result Received: 04/01/23 01:02   Thyroid Stimulating-Hormone (TSH) Order: [id number] Component Ref Range & Units 2 mo ago  Thyroid Stimulating Hormone (TSH) 0.450-5.330 uIU/ml uIU/mL 4.279  Comment: Reference Range for Pregnant Females >= 18 yrs old: Normal Range for 1st trimester: 0.05-3.70 ulU/ml Normal Range for 2nd trimester: 0.31-4.35 ulU/ml  Resulting Agency [facility]   Specimen Collected: 02/20/23 08:56   Performed by: [facility] Last Resulted: 02/20/23 14:49  Received From: [facility]  Result Received: 02/21/23 10:45       Urinalysis Urinalysis w/Microscopic Order: [id number] Component Ref Range & Units 1 mo ago  Color Colorless, Straw, Light Yellow, Yellow, Dark Yellow Yellow  Clarity Clear Clear  Specific Gravity 1.005 - 1.030 1.023  pH, Urine 5.0 - 8.0 7  Protein, Urinalysis Negative mg/dL 3+ Abnormal   Glucose, Urinalysis Negative mg/dL Negative  Ketones, Urinalysis Negative mg/dL Negative  Blood, Urinalysis Negative Negative  Nitrite, Urinalysis Negative Negative  Leukocyte Esterase, Urinalysis Negative 1+ Abnormal   Bilirubin, Urinalysis Negative Negative  Urobilinogen, Urinalysis 0.2 - 1.0 mg/dL 0.2  WBC, UA <=5 /hpf 13 High   Red Blood Cells, Urinalysis <=3 /hpf 5 High   Bacteria, Urinalysis 0 - 5 /hpf 0-5  Squamous Epithelial Cells, Urinalysis /hpf 2  Resulting Agency [facility]   Specimen Collected: 03/31/23 08:48   Performed by: [facility]  Resulted: 03/31/23 12:20  Received From: [facility]  Result Received: 04/01/23 01:02  I have reviewed the labs.   Pertinent Imaging: N/A  Assessment & Plan:    1. Overactive bladder (Primary) -She failed Myrbetriq/Gemtesa -She failed PTNS  -we discussed Botox, but she is hesitant because she has a friend who is being seen at [facility] who is having Botox treatments and she has to catheterize herself at times and the Botox only last about 2 months for her friend -we discussed having an appointment with Dr. Sherron Monday to see if she is a candidate for InterStim, but she deferred -I gave her reading materials on both the Botox and InterStim and she would like to follow-up in 1 month to discuss this further or with the decision  2. Left RCC -renal protocol CT did not demonstrate any recurrence -repeat one year  3. High risk hematuria -non-smoker -work up in (2023) - NED -no reports of gross heme  Return in 1 month (on 06/02/2023) for follow-up.  These notes generated with voice recognition software. I apologize for typographical errors.  Cloretta Ned  [facility] [address]  [address] [place], [place] [address] [phone]

## 2023-05-02 ENCOUNTER — Encounter: Payer: Self-pay | Admitting: Urology

## 2023-05-02 ENCOUNTER — Ambulatory Visit: Payer: Medicare Other | Admitting: Urology

## 2023-05-02 VITALS — BP 180/74 | HR 75 | Ht 69.0 in | Wt 151.0 lb

## 2023-05-02 DIAGNOSIS — C642 Malignant neoplasm of left kidney, except renal pelvis: Secondary | ICD-10-CM

## 2023-05-02 DIAGNOSIS — Z85528 Personal history of other malignant neoplasm of kidney: Secondary | ICD-10-CM | POA: Diagnosis not present

## 2023-05-02 DIAGNOSIS — Z87898 Personal history of other specified conditions: Secondary | ICD-10-CM

## 2023-05-02 DIAGNOSIS — N3281 Overactive bladder: Secondary | ICD-10-CM

## 2023-05-02 DIAGNOSIS — R319 Hematuria, unspecified: Secondary | ICD-10-CM

## 2023-06-02 NOTE — Progress Notes (Deleted)
06/03/2023 9:06 AM   Vanessa Esparza [birth year] [id number]  Referring provider: Jerl Mina, MD [address] [place],  [place] [address]  Urological history: 1. 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  Overactive bladder with urge incontinence -Myrbetriq 50 mg daily  3. High risk hematuria -non-smoker -hx of RCC - see #1 -CTU (04/2022) - see #1 -cysto (01/2022) - NED     No chief complaint on file.  HPI: Vanessa Esparza is a 83 y.o. female who presents today for one month follow up.      Previous records reviewed.   At her visit on 05/02/2023, she said the PTNS " was not worth a flip and was a complete waist of time."  She noticed no benefit after undergoing 12 weekly treatments.  She states she still has the same amount of incontinence, frequency and urgency.  She continues to take Myrbetriq 50 mg daily.  She did feel the Myrbetriq is helpful either.  She also did not find the Gemtesa helpful.   Patient denies any modifying or aggravating factors.  Patient denies any recent UTI's, gross hematuria, dysuria or suprapubic/flank pain.  Patient denies any fevers, chills, nausea or vomiting.   We discussed alternative treatment options for her OAB, specifically InterStim and Botox.  She was given reading materials and stated that she would like to research these options further and would return in 1 month with a decision.    PMH: Past Medical History:  Diagnosis Date   Anemia    Arthritis    Collagen vascular disease (HCC)    Rhematoid Arthritis   Diabetes mellitus without complication (HCC)    GERD (gastroesophageal reflux disease)    Gout    Hyperlipidemia    Hypertension    Hypothyroidism    Meningioma (HCC)    Psoriasis    Renal cell carcinoma, left (HCC) 05/2011   Left Partial Nephrectomy   Seizure (HCC)    Seizures (HCC)    LAST SEIZURE ON 02-08-09    Surgical  History: Past Surgical History:  Procedure Laterality Date   ABDOMINAL HYSTERECTOMY     APPENDECTOMY     BACK SURGERY     LUMBAR   BRAIN MENINGIOMA EXCISION     BRAIN SURGERY     CRANIOTOMY WITH EXCISION OF SUPRATENTORIAL MENINGIOMA   COLONOSCOPY     COLONOSCOPY WITH PROPOFOL N/A 01/28/2017   Procedure: COLONOSCOPY WITH PROPOFOL;  Surgeon: Christena Deem, MD;  Location: [facility] ENDOSCOPY;  Service: Endoscopy;  Laterality: N/A;   COLONOSCOPY WITH PROPOFOL N/A 01/01/2021   Procedure: COLONOSCOPY WITH PROPOFOL;  Surgeon: Jaynie Collins, DO;  Location[facility] ENDOSCOPY;  Service: Gastroenterology;  Laterality: N/A;   CRANIOTOMY     w/ excision supratentorial meningioma   ESOPHAGOGASTRODUODENOSCOPY     ESOPHAGOGASTRODUODENOSCOPY (EGD) WITH PROPOFOL N/A 01/28/2017   Procedure: ESOPHAGOGASTRODUODENOSCOPY (EGD) WITH PROPOFOL;  Surgeon: Christena Deem, MD;  Location: [facility] ENDOSCOPY;  Service: Endoscopy;  Laterality: N/A;   ESOPHAGOGASTRODUODENOSCOPY (EGD) WITH PROPOFOL N/A 01/01/2021   Procedure: ESOPHAGOGASTRODUODENOSCOPY (EGD) WITH PROPOFOL;  Surgeon: Jaynie Collins, DO;  Location: [facility] ENDOSCOPY;  Service: Gastroenterology;  Laterality: N/A;   HEMORRHOID SURGERY     KNEE ARTHROSCOPY Right 09/21/2015   Procedure: ARTHROSCOPY KNEE, LATERAL AND MEDIAL MENISECTOMY;  Surgeon: Kennedy Bucker, MD;  Location: [facility] ORS;  Service: Orthopedics;  Laterality: Right;   LAPAROSCOPIC BILATERAL SALPINGO OOPHERECTOMY Bilateral 04/12/2019  Procedure: LAPAROSCOPIC BILATERAL SALPINGO OOPHORECTOMY;  Surgeon: Ward, Elenora Fender, MD;  Location: [facility] ORS;  Service: Gynecology;  Laterality: Bilateral;   PARTIAL NEPHRECTOMY Left    DUE TO RENAL CELL CARCINOMA   REDUCTION MAMMAPLASTY Bilateral    THYROID SURGERY     TOTAL KNEE ARTHROPLASTY Right 06/30/2018   Procedure: TOTAL KNEE ARTHROPLASTY-RIGHT;  Surgeon: Kennedy Bucker, MD;  Location: [facility] ORS;  Service: Orthopedics;  Laterality: Right;    Home  Medications:  Allergies as of 06/03/2023       Reactions   Arava [leflunomide] Swelling   Swelling tongue and ulcers in mouth    Methotrexate Derivatives Swelling   Tongue swelling   Statins Other (See Comments)   BLOATED FEELING, CHEST FULLNESS        Medication List        Accurate as of June 02, 2023  9:06 AM. If you have any questions, ask your nurse or doctor.          amLODipine 5 MG tablet Commonly known as: NORVASC Take 5 mg by mouth daily.   calcium carbonate 600 MG Tabs tablet Commonly known as: OS-CAL Take 600 mg by mouth daily.   cefUROXime 500 MG tablet Commonly known as: CEFTIN Take 1 tablet (500 mg total) by mouth 2 (two) times daily with a meal.   feeding supplement Liqd Take 237 mLs by mouth 3 (three) times daily between meals.   fexofenadine 180 MG tablet Commonly known as: ALLEGRA Take 180 mg by mouth daily.   Fluocinolone Acetonide Scalp 0.01 % Oil Apply topically.   fluocinonide 0.05 % external solution Commonly known as: LIDEX Apply topically 2 (two) times daily as needed.   glipiZIDE 5 MG 24 hr tablet Commonly known as: GLUCOTROL XL Take 5 mg by mouth daily.   glucose blood test strip USE 1 STRIP TO CHECK GLUCOSE TWICE DAILY   levothyroxine 150 MCG tablet Commonly known as: SYNTHROID Take 150 mcg by mouth daily.   lisinopril 20 MG tablet Commonly known as: ZESTRIL Take 1 tablet (20 mg total) by mouth daily.   mirabegron ER 50 MG Tb24 tablet Commonly known as: MYRBETRIQ Take 1 tablet (50 mg total) by mouth daily.   multivitamin with minerals Tabs tablet Take 1 tablet by mouth daily.   omeprazole 40 MG capsule Commonly known as: PRILOSEC Take 40 mg by mouth daily.        Allergies:  Allergies  Allergen Reactions   Arava [Leflunomide] Swelling    Swelling tongue and ulcers in mouth    Methotrexate Derivatives Swelling    Tongue swelling   Statins Other (See Comments)    BLOATED FEELING, CHEST FULLNESS     Family History: Family History  Problem Relation Age of Onset   Breast cancer Other    Breast cancer Mother     Social History:  reports that she has never smoked. She has never been exposed to tobacco smoke. She has never used smokeless tobacco. She reports that she does not drink alcohol and does not use drugs.  ROS: Pertinent ROS in HPI  Physical Exam: There were no vitals taken for this visit.  Constitutional:  Well nourished. Alert and oriented, No acute distress. HEENT:  AT, moist mucus membranes.  Trachea midline, no masses. Cardiovascular: No clubbing, cyanosis, or edema. Respiratory: Normal respiratory effort, no increased work of breathing. GU: No CVA tenderness.  No bladder fullness or masses.  Recession of labia minora, dry, pale vulvar vaginal mucosa and loss of  mucosal ridges and folds.  Normal urethral meatus, no lesions, no prolapse, no discharge.   No urethral masses, tenderness and/or tenderness. No bladder fullness, tenderness or masses. *** vagina mucosa, *** estrogen effect, no discharge, no lesions, *** pelvic support, *** cystocele and *** rectocele noted.  No cervical motion tenderness.  Uterus is freely mobile and non-fixed.  No adnexal/parametria masses or tenderness noted.  Anus and perineum are without rashes or lesions.   ***  Neurologic: Grossly intact, no focal deficits, moving all 4 extremities. Psychiatric: Normal mood and affect.    Laboratory Data: N/A  Pertinent Imaging: N/A  Assessment & Plan:    1. Overactive bladder (Primary) -She failed Myrbetriq/Gemtesa -She failed PTNS  -we discussed Botox, but she is hesitant because she has a friend who is being seen at [facility] who is having Botox treatments and she has to catheterize herself at times and the Botox only last about 2 months for her friend -we discussed having an appointment with Dr. Sherron Monday to see if she is a candidate for InterStim, but she deferred -I gave her reading  materials on both the Botox and InterStim and she would like to follow-up in 1 month to discuss this further or with the decision  2. Left RCC -renal protocol CT did not demonstrate any recurrence -repeat one year (03/2024)   3. High risk hematuria -non-smoker -work up in (2023) - NED -no reports of gross heme  No follow-ups on file.  These notes generated with voice recognition software. I apologize for typographical errors.  Cloretta Ned  [facility] [address]  [address] [place], [place] [address] [phone]

## 2023-06-03 ENCOUNTER — Ambulatory Visit: Payer: Medicare Other | Admitting: Urology

## 2023-06-03 DIAGNOSIS — C642 Malignant neoplasm of left kidney, except renal pelvis: Secondary | ICD-10-CM

## 2023-06-03 DIAGNOSIS — N3281 Overactive bladder: Secondary | ICD-10-CM

## 2023-06-03 DIAGNOSIS — R319 Hematuria, unspecified: Secondary | ICD-10-CM

## 2023-06-09 NOTE — Progress Notes (Unsigned)
06/10/2023 12:27 PM   Vanessa Esparza 1941/04/07 [id number]  Referring provider: Jerl Mina, MD [address] [place],  [place] [address]  Urological history: 1. 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  Overactive bladder with urge incontinence -Myrbetriq 50 mg daily  3. High risk hematuria -non-smoker -hx of RCC - see #1 -CTU (04/2022) - see #1 -cysto (01/2022) - NED     No chief complaint on file.  HPI: Vanessa Esparza is a 83 y.o. female who presents today for one month follow up.      Previous records reviewed.   At her visit on 05/02/2023, she said the PTNS " was not worth a flip and was a complete waist of time."  She noticed no benefit after undergoing 12 weekly treatments.  She states she still has the same amount of incontinence, frequency and urgency.  She continues to take Myrbetriq 50 mg daily.  She did feel the Myrbetriq is helpful either.  She also did not find the Gemtesa helpful.   Patient denies any modifying or aggravating factors.  Patient denies any recent UTI's, gross hematuria, dysuria or suprapubic/flank pain.  Patient denies any fevers, chills, nausea or vomiting.   We discussed alternative treatment options for her OAB, specifically InterStim and Botox.  She was given reading materials and stated that she would like to research these options further and would return in 1 month with a decision.    PMH: Past Medical History:  Diagnosis Date   Anemia    Arthritis    Collagen vascular disease (HCC)    Rhematoid Arthritis   Diabetes mellitus without complication (HCC)    GERD (gastroesophageal reflux disease)    Gout    Hyperlipidemia    Hypertension    Hypothyroidism    Meningioma (HCC)    Psoriasis    Renal cell carcinoma, left (HCC) 05/2011   Left Partial Nephrectomy   Seizure (HCC)    Seizures (HCC)    LAST SEIZURE ON 02-08-09    Surgical  History: Past Surgical History:  Procedure Laterality Date   ABDOMINAL HYSTERECTOMY     APPENDECTOMY     BACK SURGERY     LUMBAR   BRAIN MENINGIOMA EXCISION     BRAIN SURGERY     CRANIOTOMY WITH EXCISION OF SUPRATENTORIAL MENINGIOMA   COLONOSCOPY     COLONOSCOPY WITH PROPOFOL N/A 01/28/2017   Procedure: COLONOSCOPY WITH PROPOFOL;  Surgeon: Christena Deem, MD;  Location: [facility] ENDOSCOPY;  Service: Endoscopy;  Laterality: N/A;   COLONOSCOPY WITH PROPOFOL N/A 01/01/2021   Procedure: COLONOSCOPY WITH PROPOFOL;  Surgeon: Jaynie Collins, DO;  Location: [facility] ENDOSCOPY;  Service: Gastroenterology;  Laterality: N/A;   CRANIOTOMY     w/ excision supratentorial meningioma   ESOPHAGOGASTRODUODENOSCOPY     ESOPHAGOGASTRODUODENOSCOPY (EGD) WITH PROPOFOL N/A 01/28/2017   Procedure: ESOPHAGOGASTRODUODENOSCOPY (EGD) WITH PROPOFOL;  Surgeon: Christena Deem, MD;  Location: [facility] ENDOSCOPY;  Service: Endoscopy;  Laterality: N/A;   ESOPHAGOGASTRODUODENOSCOPY (EGD) WITH PROPOFOL N/A 01/01/2021   Procedure: ESOPHAGOGASTRODUODENOSCOPY (EGD) WITH PROPOFOL;  Surgeon: Jaynie Collins, DO;  Location: [facility] ENDOSCOPY;  Service: Gastroenterology;  Laterality: N/A;   HEMORRHOID SURGERY     KNEE ARTHROSCOPY Right 09/21/2015   Procedure: ARTHROSCOPY KNEE, LATERAL AND MEDIAL MENISECTOMY;  Surgeon: Kennedy Bucker, MD;  Location: [facility] ORS;  Service: Orthopedics;  Laterality: Right;   LAPAROSCOPIC BILATERAL SALPINGO OOPHERECTOMY Bilateral 04/12/2019  Procedure: LAPAROSCOPIC BILATERAL SALPINGO OOPHORECTOMY;  Surgeon: Ward, Elenora Fender, MD;  Location: [facility] ORS;  Service: Gynecology;  Laterality: Bilateral;   PARTIAL NEPHRECTOMY Left    DUE TO RENAL CELL CARCINOMA   REDUCTION MAMMAPLASTY Bilateral    THYROID SURGERY     TOTAL KNEE ARTHROPLASTY Right 06/30/2018   Procedure: TOTAL KNEE ARTHROPLASTY-RIGHT;  Surgeon: Kennedy Bucker, MD;  Location: [facility] ORS;  Service: Orthopedics;  Laterality: Right;    Home  Medications:  Allergies as of 06/10/2023       Reactions   Arava [leflunomide] Swelling   Swelling tongue and ulcers in mouth    Methotrexate Derivatives Swelling   Tongue swelling   Statins Other (See Comments)   BLOATED FEELING, CHEST FULLNESS        Medication List        Accurate as of June 09, 2023 12:27 PM. If you have any questions, ask your nurse or doctor.          amLODipine 5 MG tablet Commonly known as: NORVASC Take 5 mg by mouth daily.   calcium carbonate 600 MG Tabs tablet Commonly known as: OS-CAL Take 600 mg by mouth daily.   cefUROXime 500 MG tablet Commonly known as: CEFTIN Take 1 tablet (500 mg total) by mouth 2 (two) times daily with a meal.   feeding supplement Liqd Take 237 mLs by mouth 3 (three) times daily between meals.   fexofenadine 180 MG tablet Commonly known as: ALLEGRA Take 180 mg by mouth daily.   Fluocinolone Acetonide Scalp 0.01 % Oil Apply topically.   fluocinonide 0.05 % external solution Commonly known as: LIDEX Apply topically 2 (two) times daily as needed.   glipiZIDE 5 MG 24 hr tablet Commonly known as: GLUCOTROL XL Take 5 mg by mouth daily.   glucose blood test strip USE 1 STRIP TO CHECK GLUCOSE TWICE DAILY   levothyroxine 150 MCG tablet Commonly known as: SYNTHROID Take 150 mcg by mouth daily.   lisinopril 20 MG tablet Commonly known as: ZESTRIL Take 1 tablet (20 mg total) by mouth daily.   mirabegron ER 50 MG Tb24 tablet Commonly known as: MYRBETRIQ Take 1 tablet (50 mg total) by mouth daily.   multivitamin with minerals Tabs tablet Take 1 tablet by mouth daily.   omeprazole 40 MG capsule Commonly known as: PRILOSEC Take 40 mg by mouth daily.        Allergies:  Allergies  Allergen Reactions   Arava [Leflunomide] Swelling    Swelling tongue and ulcers in mouth    Methotrexate Derivatives Swelling    Tongue swelling   Statins Other (See Comments)    BLOATED FEELING, CHEST FULLNESS     Family History: Family History  Problem Relation Age of Onset   Breast cancer Other    Breast cancer Mother     Social History:  reports that she has never smoked. She has never been exposed to tobacco smoke. She has never used smokeless tobacco. She reports that she does not drink alcohol and does not use drugs.  ROS: Pertinent ROS in HPI  Physical Exam: There were no vitals taken for this visit.  Constitutional:  Well nourished. Alert and oriented, No acute distress. HEENT: [place] AT, moist mucus membranes.  Trachea midline, no masses. Cardiovascular: No clubbing, cyanosis, or edema. Respiratory: Normal respiratory effort, no increased work of breathing. GU: No CVA tenderness.  No bladder fullness or masses.  Recession of labia minora, dry, pale vulvar vaginal mucosa and loss of mucosal  ridges and folds.  Normal urethral meatus, no lesions, no prolapse, no discharge.   No urethral masses, tenderness and/or tenderness. No bladder fullness, tenderness or masses. *** vagina mucosa, *** estrogen effect, no discharge, no lesions, *** pelvic support, *** cystocele and *** rectocele noted.  No cervical motion tenderness.  Uterus is freely mobile and non-fixed.  No adnexal/parametria masses or tenderness noted.  Anus and perineum are without rashes or lesions.   ***  Neurologic: Grossly intact, no focal deficits, moving all 4 extremities. Psychiatric: Normal mood and affect.    Laboratory Data: N/A  Pertinent Imaging: N/A  Assessment & Plan:    1. Overactive bladder (Primary) -She failed Myrbetriq/Gemtesa -She failed PTNS  -we discussed Botox, but she is hesitant because she has a friend who is being seen at [facility] who is having Botox treatments and she has to catheterize herself at times and the Botox only last about 2 months for her friend -we discussed having an appointment with Dr. Sherron Monday to see if she is a candidate for InterStim, but she deferred -I gave her reading  materials on both the Botox and InterStim and she would like to follow-up in 1 month to discuss this further or with the decision  2. Left RCC -renal protocol CT did not demonstrate any recurrence -repeat one year (03/2024)   3. High risk hematuria -non-smoker -work up in (2023) - NED -no reports of gross heme  No follow-ups on file.  These notes generated with voice recognition software. I apologize for typographical errors.  Cloretta Ned  [facility] [address]  [address] [place], [place] [address] [phone]

## 2023-06-09 NOTE — Telephone Encounter (Signed)
Spoke with patient and scheduled appt

## 2023-06-10 ENCOUNTER — Encounter: Payer: Self-pay | Admitting: Urology

## 2023-06-10 ENCOUNTER — Ambulatory Visit: Payer: Medicare Other | Admitting: Urology

## 2023-06-10 VITALS — BP 153/70 | HR 67 | Ht 69.0 in | Wt 155.0 lb

## 2023-06-10 DIAGNOSIS — C642 Malignant neoplasm of left kidney, except renal pelvis: Secondary | ICD-10-CM

## 2023-06-10 DIAGNOSIS — N3281 Overactive bladder: Secondary | ICD-10-CM | POA: Diagnosis not present

## 2023-06-10 DIAGNOSIS — Z87898 Personal history of other specified conditions: Secondary | ICD-10-CM | POA: Diagnosis not present

## 2023-06-10 DIAGNOSIS — Z85528 Personal history of other malignant neoplasm of kidney: Secondary | ICD-10-CM | POA: Diagnosis not present

## 2023-06-10 DIAGNOSIS — R319 Hematuria, unspecified: Secondary | ICD-10-CM

## 2023-06-10 MED ORDER — MIRABEGRON ER 50 MG PO TB24
50.0000 mg | ORAL_TABLET | Freq: Every day | ORAL | 3 refills | Status: AC
Start: 2023-06-10 — End: ?

## 2023-11-08 ENCOUNTER — Other Ambulatory Visit: Payer: Self-pay | Admitting: Urology

## 2023-11-08 DIAGNOSIS — N3281 Overactive bladder: Secondary | ICD-10-CM

## 2024-03-15 ENCOUNTER — Ambulatory Visit
Admission: RE | Admit: 2024-03-15 | Discharge: 2024-03-15 | Disposition: A | Discharge status: Home / Self Care | Source: Ambulatory Visit | Attending: Urology | Admitting: Urology

## 2024-03-15 DIAGNOSIS — C642 Malignant neoplasm of left kidney, except renal pelvis: Secondary | ICD-10-CM | POA: Diagnosis present

## 2024-03-15 MED ORDER — IOHEXOL 300 MG/ML  SOLN
80.0000 mL | Freq: Once | INTRAMUSCULAR | Status: AC | PRN
  Administered 2024-03-15: 80 mL via INTRAVENOUS

## 2024-04-30 ENCOUNTER — Ambulatory Visit: Payer: Self-pay | Admitting: Urology

## 2024-04-30 ENCOUNTER — Encounter: Payer: Self-pay | Admitting: Urology

## 2024-04-30 VITALS — BP 194/73 | HR 69 | Ht 69.0 in | Wt 161.0 lb

## 2024-04-30 DIAGNOSIS — N3941 Urge incontinence: Secondary | ICD-10-CM | POA: Diagnosis not present

## 2024-04-30 DIAGNOSIS — N3281 Overactive bladder: Secondary | ICD-10-CM

## 2024-04-30 LAB — MICROSCOPIC EXAMINATION: RBC, Urine: NONE SEEN /HPF (ref 0–2)

## 2024-04-30 LAB — URINALYSIS, COMPLETE
Bilirubin, UA: POSITIVE — AB
Glucose, UA: NEGATIVE
Leukocytes,UA: NEGATIVE
Nitrite, UA: NEGATIVE
RBC, UA: NEGATIVE
Specific Gravity, UA: >1.03 — ABNORMAL HIGH (ref 1.005–1.030)
Urobilinogen, Ur: 0.2 mg/dL (ref 0.2–1.0)
pH, UA: 5.5 (ref 5.0–7.5)

## 2024-04-30 MED ORDER — MIRABEGRON ER 50 MG PO TB24: 50.0000 mg | ORAL_TABLET | Freq: Every day | ORAL | 3 refills | Status: AC

## 2024-04-30 NOTE — Progress Notes (Signed)
 "  04/30/2024 4:19 PM   Vanessa Esparza [birth year] [id number]  Referring provider: Valora Lynwood FALCON, MD [address] [place],  [place] [address]  Chief Complaint  Patient presents with   Over Active Bladder   Urologic history:   1. 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  Overactive bladder with urge incontinence -Myrbetriq  50 mg daily   HPI: Vanessa Esparza is a 83 y.o. female who presents for annual follow-up.  Refer to Clotilda McGowan's previous note 06/10/2023 for a recent clinical summary Stable lower urinary tract symptoms on Myrbetriq  Denies dysuria, gross hematuria No flank, abdominal or pelvic pain   PMH: Past Medical History:  Diagnosis Date   Anemia    Arthritis    Collagen vascular disease    Rhematoid Arthritis   Diabetes mellitus without complication (HCC)    GERD (gastroesophageal reflux disease)    Gout    Hyperlipidemia    Hypertension    Hypothyroidism    Meningioma (HCC)    Psoriasis    Renal cell carcinoma, left (HCC) 05/2011   Left Partial Nephrectomy   Seizure (HCC)    Seizures (HCC)    LAST SEIZURE ON 02-08-09    Surgical History: Past Surgical History:  Procedure Laterality Date   ABDOMINAL HYSTERECTOMY     APPENDECTOMY     BACK SURGERY     LUMBAR   BRAIN MENINGIOMA EXCISION     BRAIN SURGERY     CRANIOTOMY WITH EXCISION OF SUPRATENTORIAL MENINGIOMA   COLONOSCOPY     COLONOSCOPY WITH PROPOFOL  N/A 01/28/2017   Procedure: COLONOSCOPY WITH PROPOFOL ;  Surgeon: Gaylyn Gladis PENNER, MD;  Location: [facility] ENDOSCOPY;  Service: Endoscopy;  Laterality: N/A;   COLONOSCOPY WITH PROPOFOL  N/A 01/01/2021   Procedure: COLONOSCOPY WITH PROPOFOL ;  Surgeon: Onita Elspeth Sharper, DO;  Location: [facility] ENDOSCOPY;  Service: Gastroenterology;  Laterality: N/A;   CRANIOTOMY     w/ excision supratentorial meningioma   ESOPHAGOGASTRODUODENOSCOPY     ESOPHAGOGASTRODUODENOSCOPY  (EGD) WITH PROPOFOL  N/A 01/28/2017   Procedure: ESOPHAGOGASTRODUODENOSCOPY (EGD) WITH PROPOFOL ;  Surgeon: Gaylyn Gladis PENNER, MD;  Location: [facility] ENDOSCOPY;  Service: Endoscopy;  Laterality: N/A;   ESOPHAGOGASTRODUODENOSCOPY (EGD) WITH PROPOFOL  N/A 01/01/2021   Procedure: ESOPHAGOGASTRODUODENOSCOPY (EGD) WITH PROPOFOL ;  Surgeon: Onita Elspeth Sharper, DO;  Location: [facility] ENDOSCOPY;  Service: Gastroenterology;  Laterality: N/A;   HEMORRHOID SURGERY     KNEE ARTHROSCOPY Right 09/21/2015   Procedure: ARTHROSCOPY KNEE, LATERAL AND MEDIAL MENISECTOMY;  Surgeon: Sharper Flake, MD;  Location: [facility] ORS;  Service: Orthopedics;  Laterality: Right;   LAPAROSCOPIC BILATERAL SALPINGO OOPHERECTOMY Bilateral 04/12/2019   Procedure: LAPAROSCOPIC BILATERAL SALPINGO OOPHORECTOMY;  Surgeon: Ward, Mitzie BROCKS, MD;  Location: [facility] ORS;  Service: Gynecology;  Laterality: Bilateral;   PARTIAL NEPHRECTOMY Left    DUE TO RENAL CELL CARCINOMA   REDUCTION MAMMAPLASTY Bilateral    THYROID SURGERY     TOTAL KNEE ARTHROPLASTY Right 06/30/2018   Procedure: TOTAL KNEE ARTHROPLASTY-RIGHT;  Surgeon: Flake Sharper, MD;  Location: [facility] ORS;  Service: Orthopedics;  Laterality: Right;    Home Medications:  Allergies as of 04/30/2024       Reactions   Arava [leflunomide] Swelling   Swelling tongue and ulcers in mouth    Methotrexate And Trimetrexate Swelling   Tongue swelling   Statins Other (See Comments)   BLOATED FEELING, CHEST FULLNESS        Medication List  Accurate as of April 30, 2024  4:19 PM. If you have any questions, ask your nurse or doctor.          STOP taking these medications    amLODipine 5 MG tablet Commonly known as: NORVASC Stopped by: Glendia Barba, MD   calcium  carbonate 600 MG Tabs tablet Commonly known as: OS-CAL Stopped by: Glendia Barba, MD   feeding supplement Liqd Stopped by: Glendia Barba, MD   fexofenadine 180 MG tablet Commonly known as: ALLEGRA Stopped by: Terance Pomplun, MD   Fluocinolone Acetonide Scalp 0.01 % Oil Stopped by: Glendia Barba, MD   fluocinonide 0.05 % external solution Commonly known as: LIDEX Stopped by: Glendia Barba, MD       TAKE these medications    Apremilast 30 MG Tabs Take 1 tablet by mouth 2 (two) times daily.   glipiZIDE  5 MG 24 hr tablet Commonly known as: GLUCOTROL  XL Take 5 mg by mouth daily.   glucose blood test strip USE 1 STRIP TO CHECK GLUCOSE TWICE DAILY   levothyroxine  150 MCG tablet Commonly known as: SYNTHROID  Take 150 mcg by mouth daily.   lisinopril  20 MG tablet Commonly known as: ZESTRIL  Take 1 tablet (20 mg total) by mouth daily.   mirabegron  ER 50 MG Tb24 tablet Commonly known as: MYRBETRIQ  Take 1 tablet (50 mg total) by mouth daily.   multivitamin with minerals Tabs tablet Take 1 tablet by mouth daily.   omeprazole 40 MG capsule Commonly known as: PRILOSEC Take 40 mg by mouth daily.   triamcinolone cream 0.1 % Commonly known as: KENALOG Apply topically 2 (two) times daily.        Allergies: Allergies[1]  Family History: Family History  Problem Relation Age of Onset   Breast cancer Other    Breast cancer Mother     Social History:  reports that she has never smoked. She has never been exposed to tobacco smoke. She has never used smokeless tobacco. She reports that she does not drink alcohol and does not use drugs.   Physical Exam: BP (!) 194/73   Pulse 69   Ht 5' 9 (1.753 m)   Wt 161 lb (73 kg)   BMI 23.78 kg/m   Constitutional:  Alert, No acute distress. HEENT: [place] AT Respiratory: Normal respiratory effort, no increased work of breathing. Psychiatric: Normal mood and affect.  Laboratory Data:  Urinalysis Microscopy 0-5 WBC/0 RBC/0-10 epis    Assessment & Plan:    1. Overactive bladder with urge incontinence Stable Myrbetriq  refilled Continue annual follow-up   Glendia JAYSON Barba, MD  [facility] [address], [address] [place], [place] [address] [phone]     [1]  Allergies Allergen Reactions   Arava [Leflunomide] Swelling    Swelling tongue and ulcers in mouth    Methotrexate And Trimetrexate Swelling    Tongue swelling   Statins Other (See Comments)    BLOATED FEELING, CHEST FULLNESS   "

## 2024-06-11 ENCOUNTER — Other Ambulatory Visit: Payer: Self-pay

## 2024-06-11 ENCOUNTER — Emergency Department

## 2024-06-11 ENCOUNTER — Inpatient Hospital Stay
Admission: EM | Admit: 2024-06-11 | Discharge: 2024-06-14 | DRG: 956 | Disposition: A | Attending: Obstetrics and Gynecology | Admitting: Obstetrics and Gynecology

## 2024-06-11 DIAGNOSIS — N3281 Overactive bladder: Secondary | ICD-10-CM | POA: Diagnosis present

## 2024-06-11 DIAGNOSIS — D62 Acute posthemorrhagic anemia: Secondary | ICD-10-CM | POA: Diagnosis not present

## 2024-06-11 DIAGNOSIS — F0390 Unspecified dementia without behavioral disturbance: Secondary | ICD-10-CM | POA: Diagnosis present

## 2024-06-11 DIAGNOSIS — W19XXXA Unspecified fall, initial encounter: Secondary | ICD-10-CM | POA: Diagnosis present

## 2024-06-11 DIAGNOSIS — S065XAA Traumatic subdural hemorrhage with loss of consciousness status unknown, initial encounter: Secondary | ICD-10-CM

## 2024-06-11 DIAGNOSIS — Z86018 Personal history of other benign neoplasm: Secondary | ICD-10-CM | POA: Diagnosis not present

## 2024-06-11 DIAGNOSIS — Z96651 Presence of right artificial knee joint: Secondary | ICD-10-CM | POA: Diagnosis present

## 2024-06-11 DIAGNOSIS — E039 Hypothyroidism, unspecified: Secondary | ICD-10-CM | POA: Diagnosis present

## 2024-06-11 DIAGNOSIS — Z79890 Hormone replacement therapy (postmenopausal): Secondary | ICD-10-CM

## 2024-06-11 DIAGNOSIS — E119 Type 2 diabetes mellitus without complications: Secondary | ICD-10-CM | POA: Diagnosis present

## 2024-06-11 DIAGNOSIS — I10 Essential (primary) hypertension: Secondary | ICD-10-CM | POA: Diagnosis present

## 2024-06-11 DIAGNOSIS — S76011A Strain of muscle, fascia and tendon of right hip, initial encounter: Secondary | ICD-10-CM | POA: Diagnosis present

## 2024-06-11 DIAGNOSIS — M65919 Unspecified synovitis and tenosynovitis, unspecified shoulder: Secondary | ICD-10-CM | POA: Diagnosis present

## 2024-06-11 DIAGNOSIS — M069 Rheumatoid arthritis, unspecified: Secondary | ICD-10-CM | POA: Diagnosis present

## 2024-06-11 DIAGNOSIS — L4050 Arthropathic psoriasis, unspecified: Secondary | ICD-10-CM | POA: Diagnosis present

## 2024-06-11 DIAGNOSIS — S72001A Fracture of unspecified part of neck of right femur, initial encounter for closed fracture: Secondary | ICD-10-CM | POA: Diagnosis present

## 2024-06-11 DIAGNOSIS — E785 Hyperlipidemia, unspecified: Secondary | ICD-10-CM | POA: Diagnosis present

## 2024-06-11 DIAGNOSIS — G40909 Epilepsy, unspecified, not intractable, without status epilepticus: Secondary | ICD-10-CM | POA: Diagnosis present

## 2024-06-11 DIAGNOSIS — Z905 Acquired absence of kidney: Secondary | ICD-10-CM | POA: Diagnosis not present

## 2024-06-11 DIAGNOSIS — Z7984 Long term (current) use of oral hypoglycemic drugs: Secondary | ICD-10-CM | POA: Diagnosis not present

## 2024-06-11 DIAGNOSIS — S06A0XA Traumatic brain compression without herniation, initial encounter: Secondary | ICD-10-CM | POA: Diagnosis present

## 2024-06-11 DIAGNOSIS — Z90710 Acquired absence of both cervix and uterus: Secondary | ICD-10-CM

## 2024-06-11 DIAGNOSIS — Z85528 Personal history of other malignant neoplasm of kidney: Secondary | ICD-10-CM | POA: Diagnosis not present

## 2024-06-11 DIAGNOSIS — S065X0A Traumatic subdural hemorrhage without loss of consciousness, initial encounter: Secondary | ICD-10-CM | POA: Diagnosis present

## 2024-06-11 DIAGNOSIS — Z79899 Other long term (current) drug therapy: Secondary | ICD-10-CM | POA: Diagnosis not present

## 2024-06-11 DIAGNOSIS — Z888 Allergy status to other drugs, medicaments and biological substances status: Secondary | ICD-10-CM

## 2024-06-11 DIAGNOSIS — I160 Hypertensive urgency: Secondary | ICD-10-CM | POA: Diagnosis present

## 2024-06-11 DIAGNOSIS — Z803 Family history of malignant neoplasm of breast: Secondary | ICD-10-CM | POA: Diagnosis not present

## 2024-06-11 DIAGNOSIS — L409 Psoriasis, unspecified: Secondary | ICD-10-CM | POA: Insufficient documentation

## 2024-06-11 LAB — CBC WITH DIFFERENTIAL/PLATELET
Abs Immature Granulocytes: 0.05 10*3/uL (ref 0.00–0.07)
Basophils Absolute: 0 10*3/uL (ref 0.0–0.1)
Basophils Relative: 0 %
Eosinophils Absolute: 0.1 10*3/uL (ref 0.0–0.5)
Eosinophils Relative: 3 %
HCT: 29.3 % — ABNORMAL LOW (ref 36.0–46.0)
Hemoglobin: 9.1 g/dL — ABNORMAL LOW (ref 12.0–15.0)
Immature Granulocytes: 1 %
Lymphocytes Relative: 19 %
Lymphs Abs: 0.9 10*3/uL (ref 0.7–4.0)
MCH: 23.6 pg — ABNORMAL LOW (ref 26.0–34.0)
MCHC: 31.1 g/dL (ref 30.0–36.0)
MCV: 76.1 fL — ABNORMAL LOW (ref 80.0–100.0)
Monocytes Absolute: 0.3 10*3/uL (ref 0.1–1.0)
Monocytes Relative: 5 %
Neutro Abs: 3.7 10*3/uL (ref 1.7–7.7)
Neutrophils Relative %: 72 %
Platelets: 312 10*3/uL (ref 150–400)
RBC: 3.85 MIL/uL — ABNORMAL LOW (ref 3.87–5.11)
RDW: 14 % (ref 11.5–15.5)
WBC: 5.1 10*3/uL (ref 4.0–10.5)
nRBC: 0 % (ref 0.0–0.2)

## 2024-06-11 LAB — BASIC METABOLIC PANEL WITH GFR
Anion gap: 13 (ref 5–15)
BUN: 22 mg/dL (ref 8–23)
CO2: 24 mmol/L (ref 22–32)
Calcium: 9.8 mg/dL (ref 8.9–10.3)
Chloride: 102 mmol/L (ref 98–111)
Creatinine, Ser: 0.76 mg/dL (ref 0.44–1.00)
GFR, Estimated: >60 mL/min
Glucose, Bld: 115 mg/dL — ABNORMAL HIGH (ref 70–99)
Potassium: 3.6 mmol/L (ref 3.5–5.1)
Sodium: 139 mmol/L (ref 135–145)

## 2024-06-11 MED ORDER — CEFAZOLIN SODIUM-DEXTROSE 2-4 GM/100ML-% IV SOLN: 2.0000 g | INTRAVENOUS | Status: DC

## 2024-06-11 MED ORDER — LORAZEPAM 2 MG/ML IJ SOLN: 1.0000 mg | INTRAMUSCULAR | Status: DC | PRN

## 2024-06-11 MED ORDER — NICARDIPINE HCL IN NACL 20-0.86 MG/200ML-% IV SOLN
3.0000 mg/h | INTRAVENOUS | Status: DC
  Administered 2024-06-11: 5 mg/h via INTRAVENOUS
  Administered 2024-06-12: 4 mg/h via INTRAVENOUS
  Filled 2024-06-11 (×2): qty 200

## 2024-06-11 NOTE — Assessment & Plan Note (Signed)
 Accidental fall Preoperative clearance Pain control N.p.o. from midnight Plans for hip repair in the a.m.-Dr. Earnestine Blanch Further orders per orthopedics SCD for DVT prophylaxis Preop clearance:  -Dr. Katrina stated once subdural stable on follow-up CT can proceed with surgery -Patient evaluated and considered to have low risk of perioperative cardiopulmonary events.  No acute cardiovascular contraindications identified.  No active cardiac ischemia, no electrolyte disturbances or severe anemia or acute kidney injury and she is hemodynamically stable.  -Recommend proceeding with surgery once feasible.   -Postop VTE prophylaxis per orthopedics, suggest prior clearance for anticoagulation from neurosurgery

## 2024-06-11 NOTE — Progress Notes (Signed)
 Full consult note and discussion with patient to follow tomorrow AM.  Called by ED staff. Imaging reviewed.  - Plan for surgery tomorrow morning - NPO after midnight - Hold anticoagulation - Admit to Hospitalist team.

## 2024-06-11 NOTE — ED Provider Notes (Signed)
 "  [facility] Provider Note    Event Date/Time   First MD Initiated Contact with Patient 06/11/24 2123     (approximate)   History   Fall   HPI  Vanessa Esparza is a 84 y.o. female  who presents to the emergency department today after a fall. The patient was trying to get out of a chair when she got her legs tangles in the legs of the chair. Hit her head on the fall and is complaining of pain to her right upper leg. States she had been in her normal state of health prior to the fall. Denies any recent chest pain, shortness of breath, fevers or chills.        Physical Exam   Triage Vital Signs: ED Triage Vitals  Encounter Vitals Group     BP 06/11/24 2123 (!) 197/75     Girls Systolic BP Percentile --      Girls Diastolic BP Percentile --      Boys Systolic BP Percentile --      Boys Diastolic BP Percentile --      Pulse Rate 06/11/24 2122 84     Resp 06/11/24 2122 18     Temp 06/11/24 2123 98.3 F (36.8 C)     Temp Source 06/11/24 2122 Oral     SpO2 06/11/24 2122 95 %     Weight --      Height --      Head Circumference --      Peak Flow --      Pain Score --      Pain Loc --      Pain Education --      Exclude from Growth Chart --     Most recent vital signs: Vitals:   06/11/24 2122 06/11/24 2123  BP:  (!) 197/75  Pulse: 84   Resp: 18   Temp:  98.3 F (36.8 C)  SpO2: 95%    General: Awake, alert, oriented.  CV:  Good peripheral perfusion. Regular rate and rhythm. Resp:  Normal effort. Lungs clear. Abd:  No distention.  Other:  Right upper leg tender to palpitation   ED Results / Procedures / Treatments   Labs (all labs ordered are listed, but only abnormal results are displayed) Labs Reviewed  CBC WITH DIFFERENTIAL/PLATELET - Abnormal; Notable for the following components:      Result Value   RBC 3.85 (*)    Hemoglobin 9.1 (*)    HCT 29.3 (*)    MCV 76.1 (*)    MCH 23.6 (*)    All other components within normal  limits  BASIC METABOLIC PANEL WITH GFR - Abnormal; Notable for the following components:   Glucose, Bld 115 (*)    All other components within normal limits     EKG  None   RADIOLOGY I independently interpreted and visualized the CT head. My interpretation: sdh Radiology interpretation:  IMPRESSION:  1. Left cerebral convexity subdural hematoma measuring up to 4 mm, without  significant mass effect or midline shift.    I independently interpreted and visualized the CT cervical spine. My interpretation: No fracture Radiology interpretation:  IMPRESSION:  1. No evidence of acute traumatic injury.    I independently interpreted and visualized the right hip. My interpretation: hip fracture  PROCEDURES:  Critical Care performed: Yes  CRITICAL CARE Performed by: Guadalupe Eagles   Total critical care time: 30 minutes  Critical care time was exclusive of separately  billable procedures and treating other patients.  Critical care was necessary to treat or prevent imminent or life-threatening deterioration.  Critical care was time spent personally by me on the following activities: development of treatment plan with patient and/or surrogate as well as nursing, discussions with consultants, evaluation of patient's response to treatment, examination of patient, obtaining history from patient or surrogate, ordering and performing treatments and interventions, ordering and review of laboratory studies, ordering and review of radiographic studies, pulse oximetry and re-evaluation of patient's condition.   Procedures    MEDICATIONS ORDERED IN ED: Medications - No data to display   IMPRESSION / MDM / ASSESSMENT AND PLAN / ED COURSE  I reviewed the triage vital signs and the nursing notes.                              Differential diagnosis includes, but is not limited to, hip fracture, dislocation, contusion, ICH  Patient's presentation is most consistent with acute  presentation with potential threat to life or bodily function.   Patient presented to the emergency department today with complaints of right leg pain after mechanical fall.  Patient is tender around the right hip.  Neurovascularly intact distally.  X-rays consistent with a right hip fracture.  Discussed with Dr. Tobie with orthopedics who would like to take her to the OR tomorrow.  In addition patient did state that she hit her head.  Did get a head and cervical spine CT.  Head CT is concerning for subdural hematoma.  Discussed with Dr. Katrina who recommended 6-hour repeat.  If stable she would be cleared for the OR from a neurosurgical perspective.  Given patient was hypertensive here did place her on medication to help bring it down given head bleed.  Will discuss with hospitalist service for admission.     FINAL CLINICAL IMPRESSION(S) / ED DIAGNOSES   Final diagnoses:  Fall, initial encounter  Closed fracture of right hip, initial encounter (HCC)  SDH (subdural hematoma) (HCC)      Note:  This document was prepared using Dragon voice recognition software and may include unintentional dictation errors.    Floy Roberts, MD 06/11/24 [phone]  "

## 2024-06-11 NOTE — Assessment & Plan Note (Signed)
 Started on Cardene  in the ED for BP 200/84, in the setting of a traumatic subdural hematoma Continue Cardene  and titrate to SBP to 110-140 Wean Cardene  as tolerated to home meds of lisinopril 

## 2024-06-11 NOTE — ED Triage Notes (Addendum)
 Mecahnical all in her living room. States she did hit the R side of her head and c/o R hip/ leg pain. Noted shortening of R leg. EMS gave 50mcg Fentanyl . Denies LOC/ Denies Blood thinners/Denies CP/SHOB

## 2024-06-12 ENCOUNTER — Inpatient Hospital Stay

## 2024-06-12 ENCOUNTER — Encounter: Admission: EM | Disposition: A | Payer: Self-pay | Source: Home / Self Care | Attending: Obstetrics and Gynecology

## 2024-06-12 ENCOUNTER — Inpatient Hospital Stay: Admitting: Registered Nurse

## 2024-06-12 DIAGNOSIS — W19XXXA Unspecified fall, initial encounter: Secondary | ICD-10-CM

## 2024-06-12 DIAGNOSIS — S72001A Fracture of unspecified part of neck of right femur, initial encounter for closed fracture: Principal | ICD-10-CM

## 2024-06-12 DIAGNOSIS — S065XAA Traumatic subdural hemorrhage with loss of consciousness status unknown, initial encounter: Secondary | ICD-10-CM

## 2024-06-12 DIAGNOSIS — Z905 Acquired absence of kidney: Secondary | ICD-10-CM

## 2024-06-12 DIAGNOSIS — L409 Psoriasis, unspecified: Secondary | ICD-10-CM | POA: Insufficient documentation

## 2024-06-12 LAB — GLUCOSE, CAPILLARY
Glucose-Capillary: 155 mg/dL — ABNORMAL HIGH (ref 70–99)
Glucose-Capillary: 173 mg/dL — ABNORMAL HIGH (ref 70–99)

## 2024-06-12 LAB — HEMOGLOBIN A1C
Hgb A1c MFr Bld: 5.9 % — ABNORMAL HIGH (ref 4.8–5.6)
Mean Plasma Glucose: 122.63 mg/dL

## 2024-06-12 LAB — CBG MONITORING, ED
Glucose-Capillary: 130 mg/dL — ABNORMAL HIGH (ref 70–99)
Glucose-Capillary: 148 mg/dL — ABNORMAL HIGH (ref 70–99)
Glucose-Capillary: 160 mg/dL — ABNORMAL HIGH (ref 70–99)

## 2024-06-12 MED ORDER — SODIUM CHLORIDE 0.9 % IV SOLN: INTRAVENOUS | Status: DC

## 2024-06-12 MED ORDER — BISACODYL 10 MG RE SUPP: 10.0000 mg | Freq: Every day | RECTAL | Status: DC | PRN

## 2024-06-12 MED ORDER — SUGAMMADEX SODIUM 200 MG/2ML IV SOLN
INTRAVENOUS | Status: DC | PRN
  Administered 2024-06-12: 140.6 mg via INTRAVENOUS

## 2024-06-12 MED ORDER — SODIUM CHLORIDE 0.9 % IV SOLN: INTRAVENOUS | Status: DC | PRN

## 2024-06-12 MED ORDER — CEFAZOLIN SODIUM-DEXTROSE 2-4 GM/100ML-% IV SOLN
2.0000 g | Freq: Four times a day (QID) | INTRAVENOUS | Status: AC
  Administered 2024-06-12 (×2): 2 g via INTRAVENOUS
  Filled 2024-06-12 (×2): qty 100

## 2024-06-12 MED ORDER — 0.9 % SODIUM CHLORIDE (POUR BTL) OPTIME
TOPICAL | Status: DC | PRN
  Administered 2024-06-12: 500 mL

## 2024-06-12 MED ORDER — OXYCODONE HCL 5 MG PO TABS: 5.0000 mg | ORAL_TABLET | ORAL | Status: DC | PRN

## 2024-06-12 MED ORDER — SENNA 8.6 MG PO TABS
1.0000 | ORAL_TABLET | Freq: Two times a day (BID) | ORAL | Status: DC
  Administered 2024-06-14: 8.6 mg via ORAL
  Filled 2024-06-12 (×3): qty 1

## 2024-06-12 MED ORDER — PANTOPRAZOLE SODIUM 40 MG PO TBEC
40.0000 mg | DELAYED_RELEASE_TABLET | Freq: Every day | ORAL | Status: DC
  Administered 2024-06-13 – 2024-06-14 (×2): 40 mg via ORAL
  Filled 2024-06-12 (×3): qty 1

## 2024-06-12 MED ORDER — HYDROCHLOROTHIAZIDE 12.5 MG PO TABS
12.5000 mg | ORAL_TABLET | Freq: Every day | ORAL | Status: DC
  Administered 2024-06-12 – 2024-06-14 (×3): 12.5 mg via ORAL
  Filled 2024-06-12 (×3): qty 1

## 2024-06-12 MED ORDER — TRIAMCINOLONE ACETONIDE 0.1 % EX CREA
TOPICAL_CREAM | Freq: Two times a day (BID) | CUTANEOUS | Status: DC
  Filled 2024-06-12: qty 15

## 2024-06-12 MED ORDER — ONDANSETRON HCL 4 MG/2ML IJ SOLN
INTRAMUSCULAR | Status: AC
  Filled 2024-06-12: qty 2

## 2024-06-12 MED ORDER — OXYCODONE HCL 5 MG/5ML PO SOLN: 5.0000 mg | Freq: Once | ORAL | Status: DC | PRN

## 2024-06-12 MED ORDER — ACETAMINOPHEN 500 MG PO TABS
1000.0000 mg | ORAL_TABLET | Freq: Three times a day (TID) | ORAL | Status: DC
  Administered 2024-06-12 – 2024-06-14 (×6): 1000 mg via ORAL
  Filled 2024-06-12 (×6): qty 2

## 2024-06-12 MED ORDER — METOCLOPRAMIDE HCL 5 MG/ML IJ SOLN: 5.0000 mg | Freq: Three times a day (TID) | INTRAMUSCULAR | Status: DC | PRN

## 2024-06-12 MED ORDER — DROPERIDOL 2.5 MG/ML IJ SOLN
INTRAMUSCULAR | Status: AC
  Filled 2024-06-12: qty 2

## 2024-06-12 MED ORDER — ONDANSETRON HCL 4 MG/2ML IJ SOLN: 4.0000 mg | Freq: Four times a day (QID) | INTRAMUSCULAR | Status: DC | PRN

## 2024-06-12 MED ORDER — ONDANSETRON HCL 4 MG/2ML IJ SOLN
INTRAMUSCULAR | Status: DC | PRN
  Administered 2024-06-12: 4 mg via INTRAVENOUS

## 2024-06-12 MED ORDER — APREMILAST 30 MG PO TABS
1.0000 | ORAL_TABLET | Freq: Two times a day (BID) | ORAL | Status: DC
  Administered 2024-06-13 – 2024-06-14 (×2): 30 mg via ORAL
  Filled 2024-06-12 (×6): qty 1

## 2024-06-12 MED ORDER — DEXAMETHASONE SOD PHOSPHATE PF 10 MG/ML IJ SOLN
INTRAMUSCULAR | Status: AC
  Filled 2024-06-12: qty 1

## 2024-06-12 MED ORDER — ROCURONIUM BROMIDE 100 MG/10ML IV SOLN
INTRAVENOUS | Status: DC | PRN
  Administered 2024-06-12: 50 mg via INTRAVENOUS
  Administered 2024-06-12: 20 mg via INTRAVENOUS

## 2024-06-12 MED ORDER — CHLORHEXIDINE GLUCONATE CLOTH 2 % EX PADS
6.0000 | MEDICATED_PAD | Freq: Every day | CUTANEOUS | Status: DC
  Filled 2024-06-12: qty 6

## 2024-06-12 MED ORDER — HYDROMORPHONE HCL 1 MG/ML IJ SOLN
0.2000 mg | INTRAMUSCULAR | Status: DC | PRN
  Administered 2024-06-14: 0.4 mg via INTRAVENOUS
  Filled 2024-06-12: qty 0.5

## 2024-06-12 MED ORDER — ACETAMINOPHEN 10 MG/ML IV SOLN
INTRAVENOUS | Status: DC | PRN
  Administered 2024-06-12: 1000 mg via INTRAVENOUS

## 2024-06-12 MED ORDER — METOCLOPRAMIDE HCL 5 MG PO TABS: 5.0000 mg | ORAL_TABLET | Freq: Three times a day (TID) | ORAL | Status: DC | PRN

## 2024-06-12 MED ORDER — TRANEXAMIC ACID-NACL 1000-0.7 MG/100ML-% IV SOLN
INTRAVENOUS | Status: DC | PRN
  Administered 2024-06-12: 1000 mg via INTRAVENOUS

## 2024-06-12 MED ORDER — LEVETIRACETAM (KEPPRA) 500 MG/5 ML ADULT IV PUSH
500.0000 mg | Freq: Two times a day (BID) | INTRAVENOUS | Status: DC
  Administered 2024-06-12 – 2024-06-14 (×5): 500 mg via INTRAVENOUS
  Filled 2024-06-12 (×6): qty 5

## 2024-06-12 MED ORDER — MENTHOL 3 MG MT LOZG: 1.0000 | LOZENGE | OROMUCOSAL | Status: DC | PRN

## 2024-06-12 MED ORDER — BUPIVACAINE HCL (PF) 0.5 % IJ SOLN
INTRAMUSCULAR | Status: AC
  Filled 2024-06-12: qty 30

## 2024-06-12 MED ORDER — HYDROCODONE-ACETAMINOPHEN 5-325 MG PO TABS: 1.0000 | ORAL_TABLET | Freq: Four times a day (QID) | ORAL | Status: DC | PRN

## 2024-06-12 MED ORDER — METHOCARBAMOL 1000 MG/10ML IJ SOLN: 500.0000 mg | Freq: Four times a day (QID) | INTRAMUSCULAR | Status: DC | PRN

## 2024-06-12 MED ORDER — EPHEDRINE SULFATE-NACL 50-0.9 MG/10ML-% IV SOSY
PREFILLED_SYRINGE | INTRAVENOUS | Status: DC | PRN
  Administered 2024-06-12 (×2): 15 mg via INTRAVENOUS

## 2024-06-12 MED ORDER — POLYETHYLENE GLYCOL 3350 17 G PO PACK
34.0000 g | PACK | Freq: Every day | ORAL | Status: DC
  Administered 2024-06-12: 34 g via ORAL

## 2024-06-12 MED ORDER — LIDOCAINE HCL (CARDIAC) PF 100 MG/5ML IV SOSY
PREFILLED_SYRINGE | INTRAVENOUS | Status: DC | PRN
  Administered 2024-06-12: 70 mg via INTRAVENOUS

## 2024-06-12 MED ORDER — TRANEXAMIC ACID-NACL 1000-0.7 MG/100ML-% IV SOLN
INTRAVENOUS | Status: AC
  Filled 2024-06-12: qty 100

## 2024-06-12 MED ORDER — CEFAZOLIN SODIUM-DEXTROSE 2-4 GM/100ML-% IV SOLN
INTRAVENOUS | Status: AC
  Filled 2024-06-12: qty 100

## 2024-06-12 MED ORDER — PROPOFOL 10 MG/ML IV BOLUS
INTRAVENOUS | Status: AC
  Filled 2024-06-12: qty 20

## 2024-06-12 MED ORDER — PHENOL 1.4 % MT LIQD: 1.0000 | OROMUCOSAL | Status: DC | PRN

## 2024-06-12 MED ORDER — PROPOFOL 10 MG/ML IV BOLUS
INTRAVENOUS | Status: DC | PRN
  Administered 2024-06-12: 130 mg via INTRAVENOUS

## 2024-06-12 MED ORDER — KETOROLAC TROMETHAMINE 15 MG/ML IJ SOLN
INTRAMUSCULAR | Status: AC
  Filled 2024-06-12: qty 1

## 2024-06-12 MED ORDER — FLEET ENEMA RE ENEM: 1.0000 | ENEMA | Freq: Once | RECTAL | Status: DC | PRN

## 2024-06-12 MED ORDER — DROPERIDOL 2.5 MG/ML IJ SOLN
0.6250 mg | INTRAMUSCULAR | Status: AC
  Administered 2024-06-12: 0.625 mg via INTRAVENOUS

## 2024-06-12 MED ORDER — LISINOPRIL 20 MG PO TABS
40.0000 mg | ORAL_TABLET | Freq: Every day | ORAL | Status: DC
  Administered 2024-06-12 – 2024-06-14 (×3): 40 mg via ORAL
  Filled 2024-06-12 (×2): qty 2
  Filled 2024-06-12: qty 4

## 2024-06-12 MED ORDER — ROCURONIUM BROMIDE 10 MG/ML (PF) SYRINGE
PREFILLED_SYRINGE | INTRAVENOUS | Status: AC
  Filled 2024-06-12: qty 10

## 2024-06-12 MED ORDER — SENNA 8.6 MG PO TABS: 1.0000 | ORAL_TABLET | Freq: Every day | ORAL | Status: DC

## 2024-06-12 MED ORDER — LIDOCAINE HCL (PF) 2 % IJ SOLN
INTRAMUSCULAR | Status: AC
  Filled 2024-06-12: qty 5

## 2024-06-12 MED ORDER — KETOROLAC TROMETHAMINE 15 MG/ML IJ SOLN
7.5000 mg | Freq: Four times a day (QID) | INTRAMUSCULAR | Status: AC
  Administered 2024-06-12 – 2024-06-13 (×4): 7.5 mg via INTRAVENOUS
  Filled 2024-06-12 (×3): qty 1

## 2024-06-12 MED ORDER — LEVOTHYROXINE SODIUM 50 MCG PO TABS
150.0000 ug | ORAL_TABLET | Freq: Every day | ORAL | Status: DC
  Administered 2024-06-13 – 2024-06-14 (×2): 150 ug via ORAL
  Filled 2024-06-12 (×2): qty 3

## 2024-06-12 MED ORDER — FENTANYL CITRATE (PF) 100 MCG/2ML IJ SOLN
INTRAMUSCULAR | Status: AC
  Filled 2024-06-12: qty 2

## 2024-06-12 MED ORDER — BUPIVACAINE LIPOSOME 1.3 % IJ SUSP
INTRAMUSCULAR | Status: DC | PRN
  Administered 2024-06-12: 40 mL via INTRAMUSCULAR

## 2024-06-12 MED ORDER — FENTANYL CITRATE (PF) 100 MCG/2ML IJ SOLN: 25.0000 ug | INTRAMUSCULAR | Status: DC | PRN

## 2024-06-12 MED ORDER — CEFAZOLIN SODIUM-DEXTROSE 2-4 GM/100ML-% IV SOLN
2.0000 g | Freq: Four times a day (QID) | INTRAVENOUS | Status: DC
  Administered 2024-06-12: 2 g via INTRAVENOUS

## 2024-06-12 MED ORDER — TRANEXAMIC ACID-NACL 1000-0.7 MG/100ML-% IV SOLN
1000.0000 mg | Freq: Once | INTRAVENOUS | Status: AC
  Administered 2024-06-12: 1000 mg via INTRAVENOUS

## 2024-06-12 MED ORDER — METHOCARBAMOL 500 MG PO TABS: 500.0000 mg | ORAL_TABLET | Freq: Four times a day (QID) | ORAL | Status: DC | PRN

## 2024-06-12 MED ORDER — MORPHINE SULFATE (PF) 2 MG/ML IV SOLN
2.0000 mg | INTRAVENOUS | Status: DC | PRN
  Administered 2024-06-12 (×2): 2 mg via INTRAVENOUS
  Filled 2024-06-12 (×2): qty 1

## 2024-06-12 MED ORDER — OXYCODONE HCL 5 MG PO TABS: 5.0000 mg | ORAL_TABLET | Freq: Once | ORAL | Status: DC | PRN

## 2024-06-12 MED ORDER — ENOXAPARIN SODIUM 40 MG/0.4ML IJ SOSY
40.0000 mg | PREFILLED_SYRINGE | INTRAMUSCULAR | Status: DC
  Administered 2024-06-13 – 2024-06-14 (×2): 40 mg via SUBCUTANEOUS
  Filled 2024-06-12: qty 0.4

## 2024-06-12 MED ORDER — FENTANYL CITRATE (PF) 100 MCG/2ML IJ SOLN
INTRAMUSCULAR | Status: DC | PRN
  Administered 2024-06-12 (×2): 50 ug via INTRAVENOUS

## 2024-06-12 MED ORDER — INSULIN ASPART 100 UNIT/ML IJ SOLN
0.0000 [IU] | INTRAMUSCULAR | Status: DC
  Administered 2024-06-12 – 2024-06-14 (×5): 2 [IU] via SUBCUTANEOUS
  Filled 2024-06-12 (×5): qty 2
  Filled 2024-06-12: qty 1

## 2024-06-12 MED ORDER — ACETAMINOPHEN 10 MG/ML IV SOLN
INTRAVENOUS | Status: AC
  Filled 2024-06-12: qty 100

## 2024-06-12 MED ORDER — DEXAMETHASONE SOD PHOSPHATE PF 10 MG/ML IJ SOLN
INTRAMUSCULAR | Status: DC | PRN
  Administered 2024-06-12: 5 mg via INTRAVENOUS

## 2024-06-12 MED ORDER — MIRABEGRON ER 50 MG PO TB24
50.0000 mg | ORAL_TABLET | Freq: Every day | ORAL | Status: DC
  Filled 2024-06-12: qty 1

## 2024-06-12 MED ORDER — ZOLPIDEM TARTRATE 5 MG PO TABS: 5.0000 mg | ORAL_TABLET | Freq: Every evening | ORAL | Status: DC | PRN

## 2024-06-12 MED ORDER — ONDANSETRON HCL 4 MG/2ML IJ SOLN
4.0000 mg | Freq: Once | INTRAMUSCULAR | Status: AC | PRN
  Administered 2024-06-12: 4 mg via INTRAVENOUS

## 2024-06-12 MED ORDER — ONDANSETRON HCL 4 MG PO TABS: 4.0000 mg | ORAL_TABLET | Freq: Four times a day (QID) | ORAL | Status: DC | PRN

## 2024-06-12 MED ORDER — OXYCODONE HCL 5 MG PO TABS: 2.5000 mg | ORAL_TABLET | ORAL | Status: DC | PRN

## 2024-06-12 MED ORDER — TRAMADOL HCL 50 MG PO TABS
50.0000 mg | ORAL_TABLET | Freq: Four times a day (QID) | ORAL | Status: DC | PRN
  Administered 2024-06-14: 50 mg via ORAL
  Filled 2024-06-12: qty 1

## 2024-06-12 MED ORDER — MELATONIN 5 MG PO TABS: 5.0000 mg | ORAL_TABLET | Freq: Every evening | ORAL | Status: DC | PRN

## 2024-06-12 MED ORDER — SODIUM CHLORIDE 0.9 % IR SOLN
Status: DC | PRN
  Administered 2024-06-12: 3000 mL

## 2024-06-12 MED ORDER — SENNOSIDES-DOCUSATE SODIUM 8.6-50 MG PO TABS: 1.0000 | ORAL_TABLET | Freq: Every evening | ORAL | Status: DC | PRN

## 2024-06-12 NOTE — Plan of Care (Signed)

## 2024-06-12 NOTE — Transfer of Care (Addendum)
 Immediate Anesthesia Transfer of Care Note  Patient: Vanessa Esparza  Procedure(s) Performed: Procedures: HEMIARTHROPLASTY (BIPOLAR) HIP, POSTERIOR APPROACH FOR FRACTURE (Right)  Patient Location: PACU  Anesthesia Type:General  Level of Consciousness: sedated  Airway & Oxygen Therapy: Patient Spontanous Breathing and Patient connected to face mask oxygen  Post-op Assessment: Report given to RN and Post -op Vital signs reviewed and stable  Post vital signs: Reviewed and stable  Last Vitals:  Vitals:   06/12/24 1356 06/12/24 1400  BP: (!) 132/57 130/60  Pulse: 73 68  Resp: 10 10  Temp: (!) 36.3 C   SpO2: 100% 100%    Complications: No apparent anesthesia complications

## 2024-06-12 NOTE — Assessment & Plan Note (Signed)
 No acute issues suspected

## 2024-06-12 NOTE — Anesthesia Postprocedure Evaluation (Signed)
"   Anesthesia Post Note  Patient: CYBELE MAULE  Procedure(s) Performed: HEMIARTHROPLASTY (BIPOLAR) HIP, POSTERIOR APPROACH FOR FRACTURE (Right: Hip)  Patient location during evaluation: PACU Anesthesia Type: General Level of consciousness: awake and alert Pain management: pain level controlled Vital Signs Assessment: post-procedure vital signs reviewed and stable Respiratory status: spontaneous breathing, nonlabored ventilation, respiratory function stable and patient connected to nasal cannula oxygen Cardiovascular status: blood pressure returned to baseline and stable Postop Assessment: no apparent nausea or vomiting Anesthetic complications: no   No notable events documented.   Last Vitals:  Vitals:   06/12/24 1530 06/12/24 1602  BP: (!) 128/58 (!) 143/59  Pulse: 71 71  Resp: 10 18  Temp: (!) 36.4 C (!) 36.2 C  SpO2: 94% 95%    Last Pain:  Vitals:   06/12/24 1530  TempSrc:   PainSc: 0-No pain                 Shau-Shau Melia      "

## 2024-06-12 NOTE — Assessment & Plan Note (Signed)
 Sliding scale insulin  coverage

## 2024-06-12 NOTE — ED Notes (Signed)
CBG 160 

## 2024-06-12 NOTE — ED Notes (Signed)
 Medications that son brought in were dropped off with pharmacy.

## 2024-06-12 NOTE — Consult Note (Signed)
 "   Consult requested by:  Dr. Cyrena  Consult requested for:  Subdural hematoma  Primary Physician:  Valora Lynwood FALCON, MD  History of Present Illness: 06/12/2024 Ms. Vanessa Esparza is here with a chief complaint of fall.  She had a mechanical fall where she broke her hip and hit her head.  No LOC.  She presented to the ER where she was noted to be at baseline neurologically but to have operative hip fracture.  She does have some neck pain currently.  The symptoms are causing a significant impact on the patient's life.   I have utilized the care everywhere function in epic to review the outside records available from external health systems.  Review of Systems:  A 10 point review of systems is negative, except for the pertinent positives and negatives detailed in the HPI.  Past Medical History: Past Medical History:  Diagnosis Date   Anemia    Arthritis    Collagen vascular disease    Rhematoid Arthritis   Diabetes mellitus without complication (HCC)    GERD (gastroesophageal reflux disease)    Gout    Hyperlipidemia    Hypertension    Hypothyroidism    Meningioma (HCC)    Psoriasis    Renal cell carcinoma, left (HCC) 05/2011   Left Partial Nephrectomy   Seizure (HCC)    Seizures (HCC)    LAST SEIZURE ON 02-08-09    Past Surgical History: Past Surgical History:  Procedure Laterality Date   ABDOMINAL HYSTERECTOMY     APPENDECTOMY     BACK SURGERY     LUMBAR   BRAIN MENINGIOMA EXCISION     BRAIN SURGERY     CRANIOTOMY WITH EXCISION OF SUPRATENTORIAL MENINGIOMA   COLONOSCOPY     COLONOSCOPY WITH PROPOFOL  N/A 01/28/2017   Procedure: COLONOSCOPY WITH PROPOFOL ;  Surgeon: Gaylyn Gladis PENNER, MD;  Location: [facility] ENDOSCOPY;  Service: Endoscopy;  Laterality: N/A;   COLONOSCOPY WITH PROPOFOL  N/A 01/01/2021   Procedure: COLONOSCOPY WITH PROPOFOL ;  Surgeon: Onita Elspeth Sharper, DO;  Location: [facility] ENDOSCOPY;  Service: Gastroenterology;  Laterality: N/A;   CRANIOTOMY      w/ excision supratentorial meningioma   ESOPHAGOGASTRODUODENOSCOPY     ESOPHAGOGASTRODUODENOSCOPY (EGD) WITH PROPOFOL  N/A 01/28/2017   Procedure: ESOPHAGOGASTRODUODENOSCOPY (EGD) WITH PROPOFOL ;  Surgeon: Gaylyn Gladis PENNER, MD;  Location: [facility] ENDOSCOPY;  Service: Endoscopy;  Laterality: N/A;   ESOPHAGOGASTRODUODENOSCOPY (EGD) WITH PROPOFOL  N/A 01/01/2021   Procedure: ESOPHAGOGASTRODUODENOSCOPY (EGD) WITH PROPOFOL ;  Surgeon: Onita Elspeth Sharper, DO;  Location: [facility] ENDOSCOPY;  Service: Gastroenterology;  Laterality: N/A;   HEMORRHOID SURGERY     KNEE ARTHROSCOPY Right 09/21/2015   Procedure: ARTHROSCOPY KNEE, LATERAL AND MEDIAL MENISECTOMY;  Surgeon: Sharper Flake, MD;  Location: [facility] ORS;  Service: Orthopedics;  Laterality: Right;   LAPAROSCOPIC BILATERAL SALPINGO OOPHERECTOMY Bilateral 04/12/2019   Procedure: LAPAROSCOPIC BILATERAL SALPINGO OOPHORECTOMY;  Surgeon: Ward, Mitzie BROCKS, MD;  Location: [facility] ORS;  Service: Gynecology;  Laterality: Bilateral;   PARTIAL NEPHRECTOMY Left    DUE TO RENAL CELL CARCINOMA   REDUCTION MAMMAPLASTY Bilateral    THYROID SURGERY     TOTAL KNEE ARTHROPLASTY Right 06/30/2018   Procedure: TOTAL KNEE ARTHROPLASTY-RIGHT;  Surgeon: Flake Sharper, MD;  Location: [facility] ORS;  Service: Orthopedics;  Laterality: Right;    Allergies: Allergies as of 06/11/2024 - Review Complete 06/11/2024  Allergen Reaction Noted   Arava [leflunomide] Swelling 06/17/2018   Methotrexate and trimetrexate Swelling 03/26/2018   Statins Other (See Comments) 09/12/2015    Medications: Active Medications[1]  Social History: Social History[2]  Family Medical History: Family History  Problem Relation Age of Onset   Breast cancer Other    Breast cancer Mother     Physical Examination: Vitals:   06/12/24 0645 06/12/24 0650  BP: (!) 148/65 (!) 153/61  Pulse: 84 86  Resp: 19 18  Temp:    SpO2: 97% 98%    General: Patient is in no apparent distress. Attention to examination is  appropriate.  Neck:   Supple.  Full range of motion.  Respiratory: Patient is breathing without any difficulty.   NEUROLOGICAL:     Awake, alert, oriented to person, place, and time.  Speech is clear and fluent.  Cranial Nerves: Pupils equal round and reactive to light.  Facial tone is symmetric.  Facial sensation is symmetric. Shoulder shrug is symmetric. Tongue protrusion is midline.  There is no pronator drift.  Strength: Side Biceps Triceps Deltoid Interossei Grip Wrist Ext. Wrist Flex.  R 5 5 5 5 5 5 5   L 5 5 5 5 5 5 5    Side Iliopsoas Quads Hamstring PF DF EHL  R - - - 5 5 5   L 5 5 5 5 5 5    Reflexes are 2+ and symmetric at the biceps, triceps, brachioradialis, patella and achilles.   Hoffman's is absent.   Bilateral upper and lower extremity sensation is intact to light touch.    No evidence of dysmetria noted.  Gait is untested.     Medical Decision Making  Imaging: CT Head 06/12/2024 IMPRESSION: 1. No significant interval change in 5 mm left cerebral convexity subdural hematoma with mild mass effect, sulcal effacement, and stable 3 mm left to right shift. 2. Postsurgical changes from right temporal craniotomy with recurrent intracranial and extracranial meningioma, and chronic right temporal encephalomalacia.   Electronically signed by: Vanessa Hoard MD 06/12/2024 05:52 AM EST RP Workstation: [id number]  I have personally reviewed the images and agree with the above interpretation.  Assessment and Plan: Vanessa Esparza is a pleasant 84 y.o. female with minor head injury with stable findings of subdural hematoma on repeat head CT.  - keppra  500 BID for 1 week - ok to proceed for hip surgery - no additional scanning unless patienth as a change in neurological status - OK for DVT prophylaxis when indicated otherwise - No follow-up needed  I have communicated my recommendations to the requesting physician and coordinated care to facilitate these  recommendations.     [name] MD, [facility] Neurosurgery     [1]  Current Meds  Medication Sig   ammonium lactate (LAC-HYDRIN) 12 % lotion Apply 1 Application topically daily.   Apremilast  30 MG TABS Take 1 tablet by mouth 2 (two) times daily.   calcium  carbonate (OSCAL) 1500 (600 Ca) MG TABS tablet Take 600 mg of elemental calcium  by mouth 2 (two) times daily with a meal.   fluocinonide (LIDEX) 0.05 % external solution Apply 1 Application topically 2 (two) times daily as needed.   glipiZIDE  (GLUCOTROL  XL) 5 MG 24 hr tablet Take 5 mg by mouth daily.   hydrochlorothiazide  (HYDRODIURIL ) 12.5 MG tablet Take 12.5 mg by mouth daily.   ibuprofen  (ADVIL ) 200 MG tablet Take 200 mg by mouth every 6 (six) hours as needed for mild pain (pain score 1-3).   levothyroxine  (SYNTHROID ) 150 MCG tablet Take 150 mcg by mouth daily.   lisinopril  (ZESTRIL ) 40 MG tablet Take 40 mg by mouth daily.   mirabegron  ER (MYRBETRIQ ) 50 MG TB24  tablet Take 1 tablet (50 mg total) by mouth daily.   Multiple Vitamin (MULTIVITAMIN WITH MINERALS) TABS tablet Take 1 tablet by mouth daily.   omeprazole (PRILOSEC) 40 MG capsule Take 40 mg by mouth daily.   triamcinolone  cream (KENALOG ) 0.1 % Apply topically 2 (two) times daily.  [2]  Social History Tobacco Use   Smoking status: Never    Passive exposure: Never   Smokeless tobacco: Never  Vaping Use   Vaping status: Never Used  Substance Use Topics   Alcohol use: No   Drug use: No   "

## 2024-06-12 NOTE — Anesthesia Preprocedure Evaluation (Signed)
"                                    Anesthesia Evaluation  Patient identified by MRN, date of birth, ID band Patient awake    Reviewed: Allergy & Precautions, NPO status , Patient's Chart, lab work & pertinent test results  History of Anesthesia Complications Negative for: history of anesthetic complications  Airway Mallampati: III  TM Distance: >3 FB Neck ROM: Full    Dental no notable dental hx. (+) Teeth Intact, Caps   Pulmonary neg pulmonary ROS, neg sleep apnea, neg COPD, Patient abstained from smoking.Not current smoker   Pulmonary exam normal breath sounds clear to auscultation       Cardiovascular Exercise Tolerance: Good METShypertension, Pt. on medications (-) CAD and (-) Past MI (-) dysrhythmias  Rhythm:Regular Rate:Normal - Systolic murmurs    Neuro/Psych Seizures -, Well Controlled,  History of crani for meningioma  negative psych ROS   GI/Hepatic Neg liver ROS,GERD  Medicated and Controlled,,  Endo/Other  diabetes, Type 2, Oral Hypoglycemic AgentsHypothyroidism    Renal/GU Renal disease (renal CA, S/P partial nephrectomy)     Musculoskeletal  (+) Arthritis ,    Abdominal   Peds  Hematology  (+) Blood dyscrasia, anemia   Anesthesia Other Findings Past Medical History: No date: Anemia No date: Arthritis No date: Collagen vascular disease (HCC)     Comment:  Rhematoid Arthritis No date: Diabetes mellitus without complication (HCC) No date: GERD (gastroesophageal reflux disease) No date: Gout No date: Hyperlipidemia No date: Hypertension No date: Hypothyroidism No date: Meningioma (HCC) No date: Psoriasis 05/2011: Renal cell carcinoma, left (HCC)     Comment:  Left Partial Nephrectomy No date: Seizure (HCC) No date: Seizures (HCC)     Comment:  LAST SEIZURE ON 02-08-09  Reproductive/Obstetrics                              Anesthesia Physical Anesthesia Plan  ASA: 3  Anesthesia Plan: General    Post-op Pain Management:    Induction: Intravenous  PONV Risk Score and Plan: 3 and Ondansetron   Airway Management Planned: Oral ETT  Additional Equipment: None  Intra-op Plan:   Post-operative Plan: Extubation in OR  Informed Consent: I have reviewed the patients History and Physical, chart, labs and discussed the procedure including the risks, benefits and alternatives for the proposed anesthesia with the patient or authorized representative who has indicated his/her understanding and acceptance.     Dental advisory given  Plan Discussed with: CRNA  Anesthesia Plan Comments: (Discussed risks of anesthesia with patient, including possibility of difficulty with spontaneous ventilation under anesthesia necessitating airway intervention, PONV, and rare risks such as cardiac or respiratory or neurological events, and allergic reactions. Patient understands.)         Anesthesia Quick Evaluation  "

## 2024-06-12 NOTE — Progress Notes (Addendum)
 " PROGRESS NOTE    Vanessa Esparza  FMW:[id number] DOB: 01/16/1941 DOA: 06/11/2024 PCP: Valora Lynwood FALCON, MD     Brief Narrative:   Vanessa Esparza is a 84 y.o. 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  Patient was trying to get up out of a chair when her legs hooked on the chair and she fell onto the right side, hitting her hip as well as her head.  She did not lose consciousness.  She was previously in her usual state of health.  No recent seizures. In the ED hypertensive to 200/84 with otherwise normal vitals Labs notable for hemoglobin of 9.2, down from 10.2 a month ago but otherwise unremarkable CT head left cerebral convexity subdural Thoma 4 mm without significant mass effect or midline shift Hip x-ray confirming right hip fracture CT C-spine nonacute and chest x-ray nonacute   The ED provider spoke with Dr. Earnestine Blanch who would like to take patient to the OR in the a.m. ED provider also spoke with neurosurgeon, Dr. Katrina who recommended a 6-hour follow-up and if stable can proceed with surgery   Due to elevated blood pressure patient started on Cardene  drip  Assessment & Plan:   Principal Problem:   Closed right hip fracture, initial encounter [facility]) Active Problems:   Accidental fall   Hypertensive urgency   Traumatic subdural hematoma (HCC)   Essential hypertension   Seizure disorder (HCC)   Acquired hypothyroidism   Diabetes mellitus type 2, uncomplicated (HCC)   History of meningioma s/p resection   Renal cell carcinoma with H/O partial nephrectomy   Psoriasis  # Acute displaced right femoral neck fracture No hx CAD nor symptoms of such. Neurovascularly intact. - plan for  operative repair today - pain control, bowel regimen, trend hgb - pt/ot consults tomorrow  # Subdural hematoma 2/2 fall. 5 mm left cerebral convexity hematoma with mild mass effect, 3 mm shift. Stable on repeat head CT. Neurosurgery has weighed in, advises no further w/u assuming no new neurologic symptoms - keppra  500 bid for 1 week  # Recurrent meningioma # History seizure Previously on anti-epileptic - keppra  as above  # Hypertensive urgency Resolved with cardene  gtt - d/c cardene  - resume home hydrochlorothiazide , lisinopril   # Foley status Placed in ER, apparently for convenience (baseline incontinence), will need to be discontinued after surgery  # Psoriatic arthritis - home apremilast   # Hypothyroid - home levothyroxine   # OAB - hold home myrbetriq  until foley successfully removed  # T2DM Mild glucose elevation - SSI for now  # Debility Will likely need rehab. Ambulates independently at baseline though with some dysfunction.   DVT prophylaxis: SCDs for now Code Status: full Family Communication: son at bedside (lives with husband)  Level of care: Stepdown Status is: Inpatient Remains inpatient appropriate because: severity of illness    Consultants:  Neurosurgery (signed off) orthopedics  Procedures: pending  Antimicrobials:  Peri-operative    Subjective: Stable right hip pain  Objective: Vitals:   06/12/24 0815 06/12/24 0845 06/12/24 0900 06/12/24 0923  BP: 139/65 132/62 (!) 143/61   Pulse: 77 76 80   Resp: 16 (!) 9 19   Temp:    97.7 F (36.5 C)  TempSrc:    Oral  SpO2:  97% 97% 98%     Intake/Output Summary (Last 24 hours) at 06/12/2024 0924 Last data filed at 06/12/2024 0743 Gross per 24 hour  Intake 120 ml  Output --  Net 120 ml   There were no vitals filed for this visit.  Examination:  General exam: Appears calm and comfortable  Respiratory system: Clear to auscultation. Respiratory effort normal. Cardiovascular system:  S1 & S2 heard, RRR.   Gastrointestinal system: Abdomen is nondistended, soft and nontender. N  Central nervous system: Alert and oriented. No focal neurological deficits. Extremities: external rotation right leg, distal sensation intact, distal pulses intact Skin: No visible rashes, lesions or ulcers Psychiatry: Judgement and insight appear normal. Mood & affect appropriate.     Data Reviewed: I have personally reviewed following labs and imaging studies  CBC: Recent Labs  Lab 06/11/24 2253  WBC 5.1  NEUTROABS 3.7  HGB 9.1*  HCT 29.3*  MCV 76.1*  PLT 312   Basic Metabolic Panel: Recent Labs  Lab 06/11/24 2253  NA 139  K 3.6  CL 102  CO2 24  GLUCOSE 115*  BUN 22  CREATININE 0.76  CALCIUM  9.8   GFR: CrCl cannot be calculated (Unknown ideal weight.). Liver Function Tests: No results for input(s): AST, ALT, ALKPHOS, BILITOT, PROT, ALBUMIN in the last 168 hours. No results for input(s): LIPASE, AMYLASE in the last 168 hours. No results for input(s): AMMONIA in the last 168 hours. Coagulation Profile: No results for input(s): INR, PROTIME in the last 168 hours. Cardiac Enzymes: No results for input(s): CKTOTAL, CKMB, CKMBINDEX, TROPONINI in the last 168 hours. BNP (last 3 results) No results for input(s): PROBNP in the last 8760 hours. HbA1C: No results for input(s): HGBA1C in the last 72 hours. CBG: Recent Labs  Lab 06/12/24 0052 06/12/24 0741  GLUCAP 148* 160*   Lipid Profile: No results for input(s): CHOL, HDL, LDLCALC, TRIG, CHOLHDL, LDLDIRECT in the last 72 hours. Thyroid Function Tests: No results for input(s): TSH, T4TOTAL, FREET4, T3FREE, THYROIDAB in the last 72 hours. Anemia Panel: No results for input(s): VITAMINB12, FOLATE, FERRITIN, TIBC, IRON, RETICCTPCT in the last 72 hours. Urine analysis:    Component Value Date/Time   COLORURINE YELLOW 11/08/2021 1621   APPEARANCEUR Clear  04/30/2024 1055   LABSPEC 1.025 11/08/2021 1621   LABSPEC 1.026 04/25/2014 1237   PHURINE 7.0 11/08/2021 1621   GLUCOSEU Negative 04/30/2024 1055   GLUCOSEU Negative 04/25/2014 1237   HGBUR SMALL (A) 11/08/2021 1621   BILIRUBINUR Positive (A) 04/30/2024 1055   BILIRUBINUR Negative 04/25/2014 1237   KETONESUR 5 (A) 11/08/2021 1621   PROTEINUR 3+ (A) 04/30/2024 1055   PROTEINUR 30 (A) 11/08/2021 1621   NITRITE Negative 04/30/2024 1055   NITRITE NEGATIVE 11/08/2021 1621   LEUKOCYTESUR Negative 04/30/2024 1055   LEUKOCYTESUR MODERATE (A) 11/08/2021 1621   LEUKOCYTESUR Negative 04/25/2014 1237   Sepsis Labs: @LABRCNTIP (procalcitonin:4,lacticidven:4)  )No results found for this or any previous visit (from the past 240 hours).       Radiology Studies: CT HEAD WO CONTRAST ( ) Result Date: 06/12/2024 EXAM: CT HEAD WITHOUT CONTRAST 06/12/2024 04:24:12 AM TECHNIQUE: CT of the head was performed without the administration of intravenous contrast. Automated exposure control, iterative reconstruction, and/or weight based adjustment of the mA/kV was utilized to reduce the radiation dose to as low as reasonably achievable. COMPARISON: 06/11/2024 CLINICAL HISTORY: Subdural hematoma; follow-up study. FINDINGS: BRAIN AND VENTRICLES: Unchanged 5 mm left cerebral convexity subdural hematoma. Mild mass effect with sulcal effacement. Mild 3 mm  left to right shift, stable. Sequela of mild chronic microvascular ischemic change. Postsurgical changes from right temporal craniotomy. Recurrent intracranial meningioma again noted. Chronic encephalomalacia within the underlying right temporal lobe. No evidence of acute infarct. No hydrocephalus. ORBITS: Bilateral lens replacement. SINUSES: No acute abnormality. SOFT TISSUES AND SKULL: Postsurgical changes from right temporal craniotomy. Recurrent extracranial meningioma again noted. No skull fracture. IMPRESSION: 1. No significant interval change in 5 mm left  cerebral convexity subdural hematoma with mild mass effect, sulcal effacement, and stable 3 mm left to right shift. 2. Postsurgical changes from right temporal craniotomy with recurrent intracranial and extracranial meningioma, and chronic right temporal encephalomalacia. Electronically signed by: Morene Hoard MD 06/12/2024 05:52 AM EST RP Workstation: [id number]   DG Chest 1 View Result Date: 06/11/2024 EXAM: 1 VIEW XRAY OF THE CHEST 06/11/2024 10:13:09 PM COMPARISON: Chest x-ray dated 08/26/2021. CLINICAL HISTORY: Fall. FINDINGS: LUNGS AND PLEURA: No focal pulmonary opacity. No pleural effusion. No pneumothorax. HEART AND MEDIASTINUM: Calcified aorta. No acute abnormality of the cardiac and mediastinal silhouettes. BONES AND SOFT TISSUES: Calcified fibroadenoma in right breast. Calcific density is seen in the lateral right chest wall measuring 4.8 x 3.1 cm. No acute osseous abnormality. IMPRESSION: 1. No acute cardiopulmonary process. 2. Aortic atherosclerosis. 3. Calcifications in the right breast. Electronically signed by: Greig Pique MD 06/11/2024 11:43 PM EST RP Workstation: [id number]   DG Hip Unilat W or Wo Pelvis 2-3 Views Right Result Date: 06/11/2024 EXAM: 2 or 3 VIEW(S) XRAY OF THE RIGHT HIP 06/11/2024 10:13:09 PM COMPARISON: None available. CLINICAL HISTORY: Fall. FINDINGS: BONES AND JOINTS: Acute superiorly displaced and impacted right femoral neck fracture. No malalignment. LUMBAR SPINE: Degenerative changes of the visualized lower lumbar spine. SOFT TISSUES: Vascular calcifications. IMPRESSION: 1. Acute superiorly displaced and impacted right femoral neck fracture. Electronically signed by: Greig Pique MD 06/11/2024 11:41 PM EST RP Workstation: [id number]   CT Cervical Spine Wo Contrast Result Date: 06/11/2024 EXAM: CT CERVICAL SPINE WITHOUT CONTRAST 06/11/2024 09:50:13 PM TECHNIQUE: CT of the cervical spine was performed without the administration of intravenous contrast.  Multiplanar reformatted images are provided for review. Automated exposure control, iterative reconstruction, and/or weight based adjustment of the mA/kV was utilized to reduce the radiation dose to as low as reasonably achievable. COMPARISON: None available. CLINICAL HISTORY: fall FINDINGS: BONES AND ALIGNMENT: No acute fracture or traumatic malalignment. DEGENERATIVE CHANGES: Age commensurate multilevel spondylosis and disc space height loss. Moderate facet arthropathy on the left at C2-C3 and C3-C4. Calcification of the posterior longitudinal ligament at C6-C7 causes moderate effacement of the ventral thecal sac. SOFT TISSUES: No prevertebral soft tissue swelling. IMPRESSION: 1. No evidence of acute traumatic injury. Electronically signed by: Norman Gatlin MD 06/11/2024 10:09 PM EST RP Workstation: [id number]   CT Head Wo Contrast Result Date: 06/11/2024 EXAM: CT HEAD WITHOUT CONTRAST 06/11/2024 09:50:13 PM TECHNIQUE: CT of the head was performed without the administration of intravenous contrast. Automated exposure control, iterative reconstruction, and/or weight based adjustment of the mA/kV was utilized to reduce the radiation dose to as low as reasonably achievable. COMPARISON: 11/08/2021 CLINICAL HISTORY: Fall. FINDINGS: BRAIN AND VENTRICLES: Left cerebral convexity subdural hematoma measuring up to 4 mm. Right temporal encephalomalacia. Atherosclerotic calcifications within the cavernous internal carotid arteries. No evidence of acute infarct. No hydrocephalus. No mass effect or midline shift. ORBITS: Bilateral lens replacement noted. SINUSES: Partial right mastoid air cell effusion. SOFT TISSUES AND SKULL: Similar changes of right temporal craniotomy for mass resection. Heterogeneous calcification along the inner and outer tables of  the calvarium along the inferior aspect of the craniotomy site with associated adjacent soft tissue thickening. No acute soft tissue abnormality. No skull fracture.  Critical value/emergent results were called by telephone at the time of interpretation on 1 / 30 / 26 at 10:05 pm to Dr. Floy, who verbally acknowledged these results. IMPRESSION: 1. Left cerebral convexity subdural hematoma measuring up to 4 mm, without significant mass effect or midline shift. Electronically signed by: Norman Gatlin MD 06/11/2024 10:07 PM EST RP Workstation: [id number]        Scheduled Meds:  Apremilast   1 tablet Oral BID   Chlorhexidine  Gluconate Cloth  6 each Topical Daily   insulin  aspart  0-9 Units Subcutaneous Q4H   levETIRAcetam   500 mg Intravenous Q12H   levothyroxine   150 mcg Oral Q0600   mirabegron  ER  50 mg Oral Daily   pantoprazole   40 mg Oral Daily   triamcinolone  cream   Topical BID   Continuous Infusions:   ceFAZolin  (ANCEF ) IV     niCARDipine  4 mg/hr (06/12/24 0413)     LOS: 1 day     Devaughn KATHEE Ban, MD [facility]   If 7PM-7AM, please contact night-coverage [email or web address] Password [facility] 06/12/2024, 9:24 AM     "

## 2024-06-12 NOTE — Assessment & Plan Note (Signed)
 Neurosurgery was consulted from the ED, recommended repeat CT head in 6 hours and okay for surgery once stable Follow-up CT head Neurologic checks Fall precautions

## 2024-06-12 NOTE — Anesthesia Procedure Notes (Signed)
 Procedure Name: Intubation Date/Time: 06/12/2024 11:35 AM  Performed by: Tod Handing, CRNAPre-anesthesia Checklist: Patient identified, Patient being monitored, Timeout performed, Emergency Drugs available and Suction available Patient Re-evaluated:Patient Re-evaluated prior to induction Oxygen Delivery Method: Circle system utilized Preoxygenation: Pre-oxygenation with 100% oxygen Induction Type: IV induction Ventilation: Mask ventilation without difficulty Laryngoscope Size: Mac and 3 Grade View: Grade I Tube type: Oral Tube size: 7.0 mm Number of attempts: 1 Airway Equipment and Method: Stylet Placement Confirmation: ETT inserted through vocal cords under direct vision, positive ETCO2 and breath sounds checked- equal and bilateral Secured at: 22 cm Tube secured with: Tape Dental Injury: Teeth and Oropharynx as per pre-operative assessment

## 2024-06-12 NOTE — H&P (Signed)
 " History and Physical    Patient: Vanessa Esparza FMW:[id number] DOB: [birth year] DOA: 06/11/2024 DOS: the patient was seen and examined on 06/12/2024 PCP: Valora Lynwood FALCON, MD  Patient coming from: Home  Chief Complaint:  Chief Complaint  Patient presents with   Fall    HPI: VALINCIA TOUCH is a 84 y.o. 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  Patient was trying to get up out of a chair when her legs hooked on the chair and she fell onto the right side, hitting her hip as well as her head.  She did not lose consciousness.  She was previously in her usual state of health.  No recent seizures. In the ED hypertensive to 200/84 with otherwise normal vitals Labs notable for hemoglobin of 9.2, down from 10.2 a month ago but otherwise unremarkable CT head left cerebral convexity subdural Thoma 4 mm without significant mass effect or midline shift Hip x-ray confirming right hip fracture CT C-spine nonacute and chest x-ray nonacute  The ED provider spoke with Dr. Earnestine Blanch who would like to take patient to the OR in the a.m. ED provider also spoke with neurosurgeon, Dr. Katrina who recommended a 6-hour follow-up and if stable can proceed with surgery  Due to elevated blood pressure patient started on Cardene  drip  Admission requested     Past Medical History:  Diagnosis Date   Anemia    Arthritis    Collagen vascular disease    Rhematoid Arthritis   Diabetes mellitus without complication (HCC)    GERD (gastroesophageal reflux disease)    Gout    Hyperlipidemia    Hypertension    Hypothyroidism    Meningioma (HCC)    Psoriasis    Renal cell carcinoma, left (HCC) 05/2011   Left Partial  Nephrectomy   Seizure (HCC)    Seizures (HCC)    LAST SEIZURE ON 02-08-09   Past Surgical History:  Procedure Laterality Date   ABDOMINAL HYSTERECTOMY     APPENDECTOMY     BACK SURGERY     LUMBAR   BRAIN MENINGIOMA EXCISION     BRAIN SURGERY     CRANIOTOMY WITH EXCISION OF SUPRATENTORIAL MENINGIOMA   COLONOSCOPY     COLONOSCOPY WITH PROPOFOL  N/A 01/28/2017   Procedure: COLONOSCOPY WITH PROPOFOL ;  Surgeon: Gaylyn Gladis PENNER, MD;  Location: [facility] ENDOSCOPY;  Service: Endoscopy;  Laterality: N/A;   COLONOSCOPY WITH PROPOFOL  N/A 01/01/2021   Procedure: COLONOSCOPY WITH PROPOFOL ;  Surgeon: Onita Elspeth Sharper, DO;  Location: [facility] ENDOSCOPY;  Service: Gastroenterology;  Laterality: N/A;   CRANIOTOMY     w/ excision supratentorial meningioma   ESOPHAGOGASTRODUODENOSCOPY     ESOPHAGOGASTRODUODENOSCOPY (EGD) WITH PROPOFOL  N/A 01/28/2017   Procedure: ESOPHAGOGASTRODUODENOSCOPY (EGD) WITH PROPOFOL ;  Surgeon: Gaylyn Gladis PENNER, MD;  Location: [facility] ENDOSCOPY;  Service: Endoscopy;  Laterality: N/A;   ESOPHAGOGASTRODUODENOSCOPY (EGD) WITH PROPOFOL  N/A 01/01/2021   Procedure: ESOPHAGOGASTRODUODENOSCOPY (EGD) WITH PROPOFOL ;  Surgeon: Onita Elspeth Sharper, DO;  Location: [facility] ENDOSCOPY;  Service: Gastroenterology;  Laterality: N/A;   HEMORRHOID SURGERY     KNEE ARTHROSCOPY Right 09/21/2015   Procedure: ARTHROSCOPY KNEE, LATERAL AND MEDIAL MENISECTOMY;  Surgeon: Sharper Flake, MD;  Location: [facility] ORS;  Service: Orthopedics;  Laterality: Right;   LAPAROSCOPIC BILATERAL SALPINGO OOPHERECTOMY Bilateral  04/12/2019   Procedure: LAPAROSCOPIC BILATERAL SALPINGO OOPHORECTOMY;  Surgeon: Ward, Mitzie BROCKS, MD;  Location: [facility] ORS;  Service: Gynecology;  Laterality: Bilateral;   PARTIAL NEPHRECTOMY Left    DUE TO RENAL CELL CARCINOMA   REDUCTION MAMMAPLASTY Bilateral    THYROID SURGERY     TOTAL KNEE ARTHROPLASTY Right 06/30/2018   Procedure: TOTAL KNEE ARTHROPLASTY-RIGHT;  Surgeon: Kathlynn Sharper, MD;  Location:  [facility] ORS;  Service: Orthopedics;  Laterality: Right;   Social History:  reports that she has never smoked. She has never been exposed to tobacco smoke. She has never used smokeless tobacco. She reports that she does not drink alcohol and does not use drugs.  Allergies[1]  Family History  Problem Relation Age of Onset   Breast cancer Other    Breast cancer Mother     Prior to Admission medications  Medication Sig Start Date End Date Taking? Authorizing Provider  ammonium lactate (LAC-HYDRIN) 12 % lotion Apply 1 Application topically daily. 05/26/24  Yes [provider]  fluocinonide (LIDEX) 0.05 % external solution Apply 1 Application topically 2 (two) times daily as needed. 05/26/24  Yes [provider]  lisinopril  (ZESTRIL ) 40 MG tablet Take 40 mg by mouth daily. 06/09/24  Yes [provider]  Apremilast  30 MG TABS Take 1 tablet by mouth 2 (two) times daily. 04/15/24   [provider]  glipiZIDE  (GLUCOTROL  XL) 5 MG 24 hr tablet Take 5 mg by mouth daily. 09/07/20   [provider]  glucose blood test strip USE 1 STRIP TO CHECK GLUCOSE TWICE DAILY 03/20/18   [provider]  hydrochlorothiazide  (HYDRODIURIL ) 12.5 MG tablet Take 12.5 mg by mouth daily.    [provider]  levothyroxine  (SYNTHROID ) 150 MCG tablet Take 150 mcg by mouth daily. 02/09/23   [provider]  mirabegron  ER (MYRBETRIQ ) 50 MG TB24 tablet Take 1 tablet (50 mg total) by mouth daily. 04/30/24   Stoioff, Glendia BROCKS, MD  Multiple Vitamin (MULTIVITAMIN WITH MINERALS) TABS tablet Take 1 tablet by mouth daily. 08/31/21   Patel, Sona, MD  omeprazole (PRILOSEC) 40 MG capsule Take 40 mg by mouth daily. 10/09/21   [provider]  triamcinolone  cream (KENALOG ) 0.1 % Apply topically 2 (two) times daily. 06/03/23   [provider]    Physical Exam: Vitals:   06/11/24 2338 06/11/24 2340 06/11/24 2345 06/11/24 2350  BP: (!) 196/82 (!) 194/76 (!) 176/74  (!) 170/70  Pulse: 82 85 86 87  Resp:    17  Temp:      TempSrc:      SpO2: 97% 99% 98% 98%   Physical Exam Vitals and nursing note reviewed.  Constitutional:      General: She is not in acute distress. HENT:     Head: Normocephalic and atraumatic.  Cardiovascular:     Rate and Rhythm: Normal rate and regular rhythm.     Heart sounds: Normal heart sounds.  Pulmonary:     Effort: Pulmonary effort is normal.     Breath sounds: Normal breath sounds.  Abdominal:     Palpations: Abdomen is soft.     Tenderness: There is no abdominal tenderness.  Neurological:     Mental Status: Mental status is at baseline.     Labs on Admission: I have personally reviewed following labs and imaging studies  CBC: Recent Labs  Lab 06/11/24 2253  WBC 5.1  NEUTROABS 3.7  HGB 9.1*  HCT 29.3*  MCV 76.1*  PLT 312  Basic Metabolic Panel: Recent Labs  Lab 06/11/24 2253  NA 139  K 3.6  CL 102  CO2 24  GLUCOSE 115*  BUN 22  CREATININE 0.76  CALCIUM  9.8   GFR: CrCl cannot be calculated (Unknown ideal weight.). Liver Function Tests: No results for input(s): AST, ALT, ALKPHOS, BILITOT, PROT, ALBUMIN in the last 168 hours. No results for input(s): LIPASE, AMYLASE in the last 168 hours. No results for input(s): AMMONIA in the last 168 hours. Coagulation Profile: No results for input(s): INR, PROTIME in the last 168 hours. Cardiac Enzymes: No results for input(s): CKTOTAL, CKMB, CKMBINDEX, TROPONINI in the last 168 hours. BNP (last 3 results) No results for input(s): PROBNP in the last 8760 hours. HbA1C: No results for input(s): HGBA1C in the last 72 hours. CBG: No results for input(s): GLUCAP in the last 168 hours. Lipid Profile: No results for input(s): CHOL, HDL, LDLCALC, TRIG, CHOLHDL, LDLDIRECT in the last 72 hours. Thyroid Function Tests: No results for input(s): TSH, T4TOTAL, FREET4, T3FREE, THYROIDAB in the last 72  hours. Anemia Panel: No results for input(s): VITAMINB12, FOLATE, FERRITIN, TIBC, IRON, RETICCTPCT in the last 72 hours. Urine analysis:    Component Value Date/Time   COLORURINE YELLOW 11/08/2021 1621   APPEARANCEUR Clear 04/30/2024 1055   LABSPEC 1.025 11/08/2021 1621   LABSPEC 1.026 04/25/2014 1237   PHURINE 7.0 11/08/2021 1621   GLUCOSEU Negative 04/30/2024 1055   GLUCOSEU Negative 04/25/2014 1237   HGBUR SMALL (A) 11/08/2021 1621   BILIRUBINUR Positive (A) 04/30/2024 1055   BILIRUBINUR Negative 04/25/2014 1237   KETONESUR 5 (A) 11/08/2021 1621   PROTEINUR 3+ (A) 04/30/2024 1055   PROTEINUR 30 (A) 11/08/2021 1621   NITRITE Negative 04/30/2024 1055   NITRITE NEGATIVE 11/08/2021 1621   LEUKOCYTESUR Negative 04/30/2024 1055   LEUKOCYTESUR MODERATE (A) 11/08/2021 1621   LEUKOCYTESUR Negative 04/25/2014 1237    Radiological Exams on Admission: DG Chest 1 View Result Date: 06/11/2024 EXAM: 1 VIEW XRAY OF THE CHEST 06/11/2024 10:13:09 PM COMPARISON: Chest x-ray dated 08/26/2021. CLINICAL HISTORY: Fall. FINDINGS: LUNGS AND PLEURA: No focal pulmonary opacity. No pleural effusion. No pneumothorax. HEART AND MEDIASTINUM: Calcified aorta. No acute abnormality of the cardiac and mediastinal silhouettes. BONES AND SOFT TISSUES: Calcified fibroadenoma in right breast. Calcific density is seen in the lateral right chest wall measuring 4.8 x 3.1 cm. No acute osseous abnormality. IMPRESSION: 1. No acute cardiopulmonary process. 2. Aortic atherosclerosis. 3. Calcifications in the right breast. Electronically signed by: Greig Pique MD 06/11/2024 11:43 PM EST RP Workstation: [id number]   DG Hip Unilat W or Wo Pelvis 2-3 Views Right Result Date: 06/11/2024 EXAM: 2 or 3 VIEW(S) XRAY OF THE RIGHT HIP 06/11/2024 10:13:09 PM COMPARISON: None available. CLINICAL HISTORY: Fall. FINDINGS: BONES AND JOINTS: Acute superiorly displaced and impacted right femoral neck fracture. No malalignment. LUMBAR  SPINE: Degenerative changes of the visualized lower lumbar spine. SOFT TISSUES: Vascular calcifications. IMPRESSION: 1. Acute superiorly displaced and impacted right femoral neck fracture. Electronically signed by: Greig Pique MD 06/11/2024 11:41 PM EST RP Workstation: [id number]   CT Cervical Spine Wo Contrast Result Date: 06/11/2024 EXAM: CT CERVICAL SPINE WITHOUT CONTRAST 06/11/2024 09:50:13 PM TECHNIQUE: CT of the cervical spine was performed without the administration of intravenous contrast. Multiplanar reformatted images are provided for review. Automated exposure control, iterative reconstruction, and/or weight based adjustment of the mA/kV was utilized to reduce the radiation dose to as low as reasonably achievable. COMPARISON: None available. CLINICAL HISTORY: fall FINDINGS: BONES AND ALIGNMENT: No acute  fracture or traumatic malalignment. DEGENERATIVE CHANGES: Age commensurate multilevel spondylosis and disc space height loss. Moderate facet arthropathy on the left at C2-C3 and C3-C4. Calcification of the posterior longitudinal ligament at C6-C7 causes moderate effacement of the ventral thecal sac. SOFT TISSUES: No prevertebral soft tissue swelling. IMPRESSION: 1. No evidence of acute traumatic injury. Electronically signed by: Norman Gatlin MD 06/11/2024 10:09 PM EST RP Workstation: [id number]   CT Head Wo Contrast Result Date: 06/11/2024 EXAM: CT HEAD WITHOUT CONTRAST 06/11/2024 09:50:13 PM TECHNIQUE: CT of the head was performed without the administration of intravenous contrast. Automated exposure control, iterative reconstruction, and/or weight based adjustment of the mA/kV was utilized to reduce the radiation dose to as low as reasonably achievable. COMPARISON: 11/08/2021 CLINICAL HISTORY: Fall. FINDINGS: BRAIN AND VENTRICLES: Left cerebral convexity subdural hematoma measuring up to 4 mm. Right temporal encephalomalacia. Atherosclerotic calcifications within the cavernous internal carotid  arteries. No evidence of acute infarct. No hydrocephalus. No mass effect or midline shift. ORBITS: Bilateral lens replacement noted. SINUSES: Partial right mastoid air cell effusion. SOFT TISSUES AND SKULL: Similar changes of right temporal craniotomy for mass resection. Heterogeneous calcification along the inner and outer tables of the calvarium along the inferior aspect of the craniotomy site with associated adjacent soft tissue thickening. No acute soft tissue abnormality. No skull fracture. Critical value/emergent results were called by telephone at the time of interpretation on 1 / 30 / 26 at 10:05 pm to Dr. Floy, who verbally acknowledged these results. IMPRESSION: 1. Left cerebral convexity subdural hematoma measuring up to 4 mm, without significant mass effect or midline shift. Electronically signed by: Norman Gatlin MD 06/11/2024 10:07 PM EST RP Workstation: [id number]   Data Reviewed for HPI: Relevant notes from primary care and specialist visits, past discharge summaries as available in EHR, including Care Everywhere. Prior diagnostic testing as pertinent to current admission diagnoses Updated medications and problem lists for reconciliation ED course, including vitals, labs, imaging, treatment and response to treatment Triage notes, nursing and pharmacy notes and ED provider's notes Notable results as noted above in HPI      Assessment and Plan: * Closed right hip fracture, initial encounter (HCC) Accidental fall Preoperative clearance Pain control N.p.o. from midnight Plans for hip repair in the a.m.-Dr. Earnestine Blanch Further orders per orthopedics SCD for DVT prophylaxis Preop clearance:  -Dr. Katrina stated once subdural stable on follow-up CT can proceed with surgery -Patient evaluated and considered to have low risk of perioperative cardiopulmonary events.  No acute cardiovascular contraindications identified.  No active cardiac ischemia, no electrolyte disturbances or  severe anemia or acute kidney injury and she is hemodynamically stable.  -Recommend proceeding with surgery once feasible.   -Postop VTE prophylaxis per orthopedics, suggest prior clearance for anticoagulation from neurosurgery    Traumatic subdural hematoma [facility]) Neurosurgery was consulted from the ED, recommended repeat CT head in 6 hours and okay for surgery once stable Follow-up CT head Neurologic checks Fall precautions  Hypertensive urgency Started on Cardene  in the ED for BP 200/84, in the setting of a traumatic subdural hematoma Continue Cardene  and titrate to SBP to 110-140 Wean Cardene  as tolerated to home meds of lisinopril   Seizure disorder (HCC) Continue home antiepileptics Ativan  as needed seizure  Acquired hypothyroidism Continue levothyroxine   Diabetes mellitus type 2, uncomplicated (HCC) Sliding scale insulin  coverage  Psoriasis Psoriatic arthritis / Synovitis hands, wrist and shoulder Follows with rheumatology Currently on Otezla -started November 2025 Currently on Kenalog  topical twice daily  Renal cell carcinoma with H/O  partial nephrectomy No acute issues suspected  History of meningioma s/p resection No acute issues suspected    DVT prophylaxis: SCD  Consults: ortho, Dr Tobie  Advance Care Planning:   Code Status: Prior   Family Communication: none  Disposition Plan: Back to previous home environment  Severity of Illness: The appropriate patient status for this patient is INPATIENT. Inpatient status is judged to be reasonable and necessary in order to provide the required intensity of service to ensure the patient's safety. The patient's presenting symptoms, physical exam findings, and initial radiographic and laboratory data in the context of their chronic comorbidities is felt to place them at high risk for further clinical deterioration. Furthermore, it is not anticipated that the patient will be medically stable for discharge from the  hospital within 2 midnights of admission.   * I certify that at the point of admission it is my clinical judgment that the patient will require inpatient hospital care spanning beyond 2 midnights from the point of admission due to high intensity of service, high risk for further deterioration and high frequency of surveillance required.*  Author: Delayne LULLA Solian, MD 06/12/2024 12:12 AM  For on call review [email or web address].      [1]  Allergies Allergen Reactions   Arava [Leflunomide] Swelling    Swelling tongue and ulcers in mouth    Methotrexate And Trimetrexate Swelling    Tongue swelling   Statins Other (See Comments)    BLOATED FEELING, CHEST FULLNESS   "

## 2024-06-12 NOTE — Op Note (Signed)
 DATE OF SURGERY: 06/12/2024  PREOPERATIVE DIAGNOSIS: Right femoral neck fracture  POSTOPERATIVE DIAGNOSIS:  Right femoral neck fracture Right gluteus medius tear  PROCEDURE:  Right hip hemiarthroplasty Right hip open gluteus medius repair  SURGEON: Earnestine HILARIO Blanch, MD  EBL: 200 cc  COMPONENTS:  Stryker - Accolade C Size 3 Stem Stryker - Unitrax 49mm head with +4 bipolar offset neck Arthrex - Fibertak 2.18mm Double Loaded x 2 Arthrex - SwiveLock 4.71mm x 2   INDICATIONS: Vanessa Esparza is a 84 y.o. female who sustained a displaced femoral neck fracture after a fall. Patient also has history of many R sided trochanteric bursa corticosteroid injections. Risks and benefits of hip hemiarthroplasty were explained to the patient and/or family. Risks include but are not limited to bleeding, infection, injury to tissues, nerves, vessels, periprosthetic infection, dislocation, limb length discrepancy and risks of anesthesia. The patient and/or family understands these risks, has completed an informed consent and wishes to proceed.   PROCEDURE:  The patient was identified in the preoperative holding area and the operative extremity was marked.  The patient was then transferred to the operating room suite and mobilized from the hospital gurney to the operating room table. General anesthesia was administered without complication. The patient was then transitioned to a lateral position.  All bony prominences were padded per protocol.  An axillary roll was placed.  Careful attention was paid to the contralateral side peroneal nerve, which was free from pressure with use of appropriate padding and blankets. A time-out was performed to confirm the patient's identity and the correct laterality of surgery. The patient was then prepped and draped in the usual sterile fashion. Appropriate pre-operative antibiotics were administered. Tranexamic acid  was administered preoperatively.    An incision that  centered on the posterior tip of the greater trochanter with a posterior curve was made. Dissection was carried down through the subcutaneous tissue.  Careful attention was made to maintain hemostasis using electrocautery.  Dissection brought us  to the level of the deep fascia where the gluteus maximus muscle and proximal portion of the IT band were identified.  The proximal region of the IT band was incised in linear fashion and this incision was extended proximally in a curvilinear fashion to split the gluteus maximus muscle parallel to its fibers to minimize bleeding.  This was accomplished using a combination bovie electrocautery as well as blunt dissection.  There was a noted full-thickness tear of the gluteus medius.  The trochanteric bursa was then visualized and dissected from anterior to posterior. A blunt homan retractor was placed beneath the abductors. The piriformis tendon and short external rotators were visualized. Bovie electrocautery was used to cut these with the capsule as one L-shaped flap. This was tagged at the corner with #5 Ethibond. At this point, the femoral neck fracture was visualized. An oscillating saw was used to make a new neck cut approximately 15mm above the lesser tuberosity with the use of a neck cut guide. The head was then freed from its remaining soft tissue attachments and measured. The head trial was then inserted into the acetabulum and sized appropriately.    We then turned our attention to preparing the femoral canal. First, a box cut was performed utilizing the box osteotome. A canal finder was inserted by hand and sequential broaching was then performed. The calcar planer was inserted onto the broach and used to smooth the calcar appropriately.  A trial stem, neutral neck, and head were inserted into the acetabulum and placed  through range of motion. Intraoperative radiographs were taken to assess hardware position and leg lengths. The hip was again dislocated and the  femoral trial components were removed. An appropriately sized cement restrictor was placed. The canal was irrigated with pulse lavage and dried. A cement gun was used to place cement into the femoral canal. Cement was then pressurized. The actual stem was inserted into the femoral canal and then driven onto the calcar. Position was held until the cement hardened. The trial head was then again inserted on the femoral component and found to be appropriate. Trial was removed and the permanent head/neck was Morse tapered onto the femoral stem and then reduced into the acetabulum.  Bipolar construct was used as there was no unipolar +4 available.   The hip stability and length were reassessed and found to be satisfactory.  The wound was then copiously irrigated with normal saline solution. The tagged sutures of the capsule and piriformis were sewn to the gluteus medius tendon. This adequately closed the hip capsule.   Next, attention was turned towards the gluteus medius repair.  The superior aspect of the greater trochanter was cleared of soft tissue.  The double loaded Arthrex fibertak anchors were placed at the superior aspect of the greater trochanter, 1 anteriorly, and 1 more posteriorly.  These would serve as medial row anchors.  Using a free needle, all strands of sutures were passed through the torn edge of the gluteus medius.  The posterior strands were loaded onto a 4.75 mm SwiveLock anchor.  This was then fixed about the posterior aspect just proximal to the vastus ridge as a lateral row anchor.  Similarly the anterior strands were loaded onto another SwiveLock anchor, and this was placed slightly more anteriorly.  This construct achieved excellent reapproximation of the gluteus medius to its footprint without undue tension.  It was stable to the internal and external rotation as well as gentle abduction and adduction.  The IT band and gluteus maximus fascia were then closed with 0-Vicryl in a running,  locked fashion. A mixture of Exparel  and bupivicaine was administered.  The subdermal layer was closed with 2-0 Vicryl in a buried interrupted fashion. Skin was approximated with staples.  Sterile dressing was applied. A knee immobilizer was placed. The patient was mobilized from the lateral position back to supine on the operating room table and then awakened from general anesthesia without complication.   POSTOPERATIVE PLAN: The patient will be WBAT on operative extremity while avoiding active hip abduction to protect the gluteus medius repair.  Recommend using a walker for at least the first 6 weeks.  Lovenox  40mg /day x 4 weeks to start on POD#1. IV Abx x 24 hours. PT/OT on POD#1. Posterior hip precautions.

## 2024-06-12 NOTE — Assessment & Plan Note (Addendum)
 Psoriatic arthritis / Synovitis hands, wrist and shoulder Follows with rheumatology Currently on Otezla -started November 2025 Currently on Kenalog  topical twice daily

## 2024-06-12 NOTE — H&P (Signed)
 H&P reviewed. No significant changes noted.

## 2024-06-12 NOTE — Assessment & Plan Note (Signed)
 Continue levothyroxine 

## 2024-06-12 NOTE — Progress Notes (Signed)
" °   06/12/24 1401  TOC Brief Assessment  Insurance and Status Reviewed  Patient has primary care physician Yes  Home environment has been reviewed From home with spouse  Prior level of function: Independent  Prior/Current Home Services No current home services  Social Drivers of Health Review SDOH reviewed no interventions necessary  Readmission risk has been reviewed Yes  Transition of care needs transition of care needs identified, TOC will continue to follow (May need DME)     ICM following for DC/DME needs. "

## 2024-06-13 DIAGNOSIS — S72001A Fracture of unspecified part of neck of right femur, initial encounter for closed fracture: Secondary | ICD-10-CM | POA: Diagnosis not present

## 2024-06-13 DIAGNOSIS — D62 Acute posthemorrhagic anemia: Secondary | ICD-10-CM | POA: Insufficient documentation

## 2024-06-13 LAB — CBC
HCT: 23.6 % — ABNORMAL LOW (ref 36.0–46.0)
Hemoglobin: 7.4 g/dL — ABNORMAL LOW (ref 12.0–15.0)
MCH: 23.9 pg — ABNORMAL LOW (ref 26.0–34.0)
MCHC: 31.4 g/dL (ref 30.0–36.0)
MCV: 76.4 fL — ABNORMAL LOW (ref 80.0–100.0)
Platelets: 280 10*3/uL (ref 150–400)
RBC: 3.09 MIL/uL — ABNORMAL LOW (ref 3.87–5.11)
RDW: 13.9 % (ref 11.5–15.5)
WBC: 6.4 10*3/uL (ref 4.0–10.5)
nRBC: 0 % (ref 0.0–0.2)

## 2024-06-13 LAB — BASIC METABOLIC PANEL WITH GFR
Anion gap: 10 (ref 5–15)
BUN: 25 mg/dL — ABNORMAL HIGH (ref 8–23)
CO2: 23 mmol/L (ref 22–32)
Calcium: 8.7 mg/dL — ABNORMAL LOW (ref 8.9–10.3)
Chloride: 105 mmol/L (ref 98–111)
Creatinine, Ser: 0.94 mg/dL (ref 0.44–1.00)
GFR, Estimated: >60 mL/min
Glucose, Bld: 118 mg/dL — ABNORMAL HIGH (ref 70–99)
Potassium: 4 mmol/L (ref 3.5–5.1)
Sodium: 137 mmol/L (ref 135–145)

## 2024-06-13 LAB — GLUCOSE, CAPILLARY
Glucose-Capillary: 109 mg/dL — ABNORMAL HIGH (ref 70–99)
Glucose-Capillary: 111 mg/dL — ABNORMAL HIGH (ref 70–99)
Glucose-Capillary: 115 mg/dL — ABNORMAL HIGH (ref 70–99)
Glucose-Capillary: 119 mg/dL — ABNORMAL HIGH (ref 70–99)
Glucose-Capillary: 165 mg/dL — ABNORMAL HIGH (ref 70–99)
Glucose-Capillary: 168 mg/dL — ABNORMAL HIGH (ref 70–99)

## 2024-06-13 MED ORDER — POLYETHYLENE GLYCOL 3350 17 G PO PACK
17.0000 g | PACK | Freq: Every day | ORAL | Status: DC
  Filled 2024-06-13: qty 1

## 2024-06-13 NOTE — Evaluation (Signed)
 Physical Therapy Evaluation Patient Details Name: Vanessa Esparza MRN: [id number] DOB: 10/01/1940 Today's Date: 06/13/2024  History of Present Illness  Pt is s/p R hip hemiarthroplasty with glute medius tear repair.  PMH includes: Anemia, arthritis, diabetes, gout, HTN, psoriasis.  Clinical Impression  Pt received in Semi-Fowler's position and agreeable to therapy.  Patient's son in room upon arrival and was able to assist with prior living situation.  Currently son lives 2 hours away, however comes home on the weekend assist parents household duties and getting groceries.  Son also reports that hired caregivers stop by 3 times a week to assist with patient and her husband.  Patient is husband's primary caregiver, however house is set up to be ADA accessible patient's ultimate goal is to return home where she can participate in home health therapies services to improve strength and return to prior level of function.  During session patient performed well however did experience some difficulty in standing from elevated bed upon initial attempt.  Patient required mod assist +2 from therapist and son to come upright, then was able to ambulate to the doorway and return to bed.  Patient stood again in order to transfer more towards head of bed and only required mod assist +1 to perform this.  Patient elected to be left at the edge of the bed while nursing arrived to room in order to check vitals.  Due to patient's living situation and assistance already provided, home health therapy is recommended at this time for patient at discharge.      If plan is discharge home, recommend the following: A little help with walking and/or transfers;A lot of help with bathing/dressing/bathroom;Assistance with cooking/housework;Assist for transportation;Help with stairs or ramp for entrance   Can travel by private vehicle        Equipment Recommendations None recommended by PT  Recommendations for Other Services        Functional Status Assessment Patient has had a recent decline in their functional status and demonstrates the ability to make significant improvements in function in a reasonable and predictable amount of time.     Precautions / Restrictions Precautions Precautions: Posterior Hip Precaution/Restrictions Comments: No R hip abduction due to glute medius repair Required Braces or Orthoses: Knee Immobilizer - Right Restrictions Weight Bearing Restrictions Per Provider Order: No RLE Weight Bearing Per Provider Order: Weight bearing as tolerated      Mobility  Bed Mobility Overal bed mobility: Modified Independent             General bed mobility comments: Pt utilizes hospital bed features to come upright in sitting.    Transfers Overall transfer level: Needs assistance Equipment used: Rolling walker (2 wheels) Transfers: Sit to/from Stand Sit to Stand: Mod assist, +2 physical assistance           General transfer comment: Pt required +2 assist on the first attempt, +1 mod on the second attempt.    Ambulation/Gait Ambulation/Gait assistance: Contact guard assist Gait Distance (Feet): 20 Feet Assistive device: Rolling walker (2 wheels) Gait Pattern/deviations: Step-to pattern Gait velocity: decreased     General Gait Details: Pt with slowed gait pattern, but safe.  Encouraged pt to stay close to walker.  Stairs            Wheelchair Mobility     Tilt Bed    Modified Rankin (Stroke Patients Only)       Balance Overall balance assessment: Mild deficits observed, not formally tested  Pertinent Vitals/Pain Pain Assessment Pain Assessment: Faces Faces Pain Scale: Hurts little more Pain Location: R hip Pain Descriptors / Indicators: Sore Pain Intervention(s): Limited activity within patient's tolerance, Monitored during session, Premedicated before session, Repositioned    Home Living  Family/patient expects to be discharged to:: Private residence Living Arrangements: Spouse/significant other Available Help at Discharge: Family;Available 24 hours/day Type of Home: House Home Access: Stairs to enter;Ramped entrance Entrance Stairs-Rails: Right;Left Entrance Stairs-Number of Steps: 1   Home Layout: One level Home Equipment: Agricultural Consultant (2 wheels);Cane - single point;Shower seat - built in;Grab bars - tub/shower;Grab bars - toilet Additional Comments: Toilet is elevated height with grab bar and arm rests    Prior Function Prior Level of Function : Independent/Modified Independent;Driving;Patient poor historian/Family not available               ADLs Comments: independent ADLs, iADLs and driving. Pt reports main caregiver for husband.     Extremity/Trunk Assessment   Upper Extremity Assessment Upper Extremity Assessment: Overall WFL for tasks assessed    Lower Extremity Assessment Lower Extremity Assessment: Generalized weakness;RLE deficits/detail RLE Deficits / Details: s/p surgical procedure       Communication   Communication Communication: No apparent difficulties    Cognition Arousal: Alert Behavior During Therapy: WFL for tasks assessed/performed   PT - Cognitive impairments: No apparent impairments                         Following commands: Intact       Cueing Cueing Techniques: Verbal cues, Tactile cues, Visual cues     General Comments      Exercises     Assessment/Plan    PT Assessment Patient needs continued PT services  PT Problem List Decreased strength;Decreased activity tolerance;Decreased balance;Decreased mobility;Decreased knowledge of use of DME;Decreased safety awareness;Pain       PT Treatment Interventions DME instruction;Gait training;Functional mobility training;Therapeutic activities;Therapeutic exercise;Balance training;Neuromuscular re-education;Patient/family education    PT Goals (Current  goals can be found in the Care Plan section)  Acute Rehab PT Goals Patient Stated Goal: to return home with husband PT Goal Formulation: With patient/family Time For Goal Achievement: 06/27/24 Potential to Achieve Goals: Good    Frequency 7X/week     Co-evaluation               AM-PAC PT 6 Clicks Mobility  Outcome Measure Help needed turning from your back to your side while in a flat bed without using bedrails?: A Little Help needed moving from lying on your back to sitting on the side of a flat bed without using bedrails?: A Little Help needed moving to and from a bed to a chair (including a wheelchair)?: A Little Help needed standing up from a chair using your arms (e.g., wheelchair or bedside chair)?: A Lot Help needed to walk in hospital room?: A Little Help needed climbing 3-5 steps with a railing? : A Lot 6 Click Score: 16    End of Session Equipment Utilized During Treatment: Gait belt Activity Tolerance: Patient tolerated treatment well Patient left: in bed;with call bell/phone within reach;with nursing/sitter in room Nurse Communication: Mobility status PT Visit Diagnosis: Unsteadiness on feet (R26.81);Other abnormalities of gait and mobility (R26.89);Repeated falls (R29.6);Muscle weakness (generalized) (M62.81);History of falling (Z91.81);Difficulty in walking, not elsewhere classified (R26.2)    Time: 8862-8789 PT Time Calculation (min) (ACUTE ONLY): 33 min   Charges:   PT Evaluation $PT Eval Low Complexity: 1 Low PT Treatments $  Therapeutic Activity: 8-22 mins PT General Charges $$ ACUTE PT VISIT: 1 Visit       Note: Portions of this document were prepared using Dragon voice recognition software and although reviewed may contain unintentional dictation errors in syntax, grammar, or spelling.   Fonda Simpers, PT, DPT Physical Therapist - [facility]  06/13/24, 2:22 PM

## 2024-06-13 NOTE — Progress Notes (Signed)
" °  Subjective: 1 Day Post-Op Procedures (LRB): HEMIARTHROPLASTY (BIPOLAR) HIP, POSTERIOR APPROACH FOR FRACTURE (Right) Patient reports pain as mild.   Patient is well, and has had no acute complaints or problems Plan is to go home versus Rehab after hospital stay. Negative for chest pain and shortness of breath Fever: no Gastrointestinal: Negative for nausea and vomiting  Objective: Vital signs in last 24 hours: Temp:  [97.1 F (36.2 C)-97.9 F (36.6 C)] 97.8 F (36.6 C) (02/01 0745) Pulse Rate:  [68-80] 73 (02/01 0745) Resp:  [9-20] 20 (02/01 0745) BP: (116-145)/(50-71) 135/71 (02/01 0745) SpO2:  [94 %-100 %] 96 % (02/01 0745) Weight:  [70.3 kg] 70.3 kg (01/31 1100)  Intake/Output from previous day:  Intake/Output Summary (Last 24 hours) at 06/13/2024 0835 Last data filed at 06/12/2024 1817 Gross per 24 hour  Intake 1436.76 ml  Output 400 ml  Net 1036.76 ml    Intake/Output this shift: No intake/output data recorded.  Labs: Recent Labs    06/11/24 2253 06/13/24 0605  HGB 9.1* 7.4*   Recent Labs    06/11/24 2253 06/13/24 0605  WBC 5.1 6.4  RBC 3.85* 3.09*  HCT 29.3* 23.6*  PLT 312 280   Recent Labs    06/11/24 2253 06/13/24 0605  NA 139 137  K 3.6 4.0  CL 102 105  CO2 24 23  BUN 22 25*  CREATININE 0.76 0.94  GLUCOSE 115* 118*  CALCIUM  9.8 8.7*   No results for input(s): LABPT, INR in the last 72 hours.   EXAM General - Patient is Alert and Oriented Extremity - Neurovascular intact Sensation intact distally Dorsiflexion/Plantar flexion intact Compartment soft Dressing/Incision - clean, dry, scant drainage Motor Function - intact, moving foot and toes well on exam.   Past Medical History:  Diagnosis Date   Anemia    Arthritis    Collagen vascular disease    Rhematoid Arthritis   Diabetes mellitus without complication (HCC)    GERD (gastroesophageal reflux disease)    Gout    Hyperlipidemia    Hypertension    Hypothyroidism     Meningioma (HCC)    Psoriasis    Renal cell carcinoma, left (HCC) 05/2011   Left Partial Nephrectomy   Seizure (HCC)    Seizures (HCC)    LAST SEIZURE ON 02-08-09    Assessment/Plan: 1 Day Post-Op Procedures (LRB): HEMIARTHROPLASTY (BIPOLAR) HIP, POSTERIOR APPROACH FOR FRACTURE (Right) Principal Problem:   Closed right hip fracture, initial encounter (HCC) Active Problems:   Diabetes mellitus type 2, uncomplicated (HCC)   Essential hypertension   Acquired hypothyroidism   Accidental fall   History of meningioma s/p resection   Seizure disorder (HCC)   Hypertensive urgency   Traumatic subdural hematoma (HCC)   Renal cell carcinoma with H/O partial nephrectomy   Psoriasis  Estimated body mass index is 22.89 kg/m as calculated from the following:   Height as of 04/30/24: 5' 9 (1.753 m).   Weight as of this encounter: 70.3 kg. Advance diet Up with therapy  Discharge planning: Dressing change as needed.  Follow-up at [facility] orthopedics in 2 weeks for staple removal and x-rays of the right hip.  Acute blood loss anemia.  Status post right hip fracture.  Hemoglobin 7.4.  Watch hemoglobin level for possible transfusion.  DVT Prophylaxis - Lovenox , Foot Pumps, and TED hose Weight-Bearing as tolerated to right leg  Krystal Doyne, PA-C Orthopaedic Surgery 06/13/2024, 8:35 AM  "

## 2024-06-13 NOTE — Plan of Care (Signed)
" °  Problem: Metabolic: Goal: Ability to maintain appropriate glucose levels will improve Outcome: Progressing   Problem: Nutritional: Goal: Maintenance of adequate nutrition will improve Outcome: Progressing   Problem: Elimination: Goal: Will not experience complications related to bowel motility Outcome: Progressing   Problem: Elimination: Goal: Will not experience complications related to urinary retention Outcome: Progressing   Problem: Pain Managment: Goal: General experience of comfort will improve and/or be controlled Outcome: Progressing   "

## 2024-06-14 ENCOUNTER — Other Ambulatory Visit: Payer: Self-pay

## 2024-06-14 ENCOUNTER — Encounter: Payer: Self-pay | Admitting: Internal Medicine

## 2024-06-14 DIAGNOSIS — S72001A Fracture of unspecified part of neck of right femur, initial encounter for closed fracture: Secondary | ICD-10-CM | POA: Diagnosis not present

## 2024-06-14 LAB — GLUCOSE, CAPILLARY
Glucose-Capillary: 102 mg/dL — ABNORMAL HIGH (ref 70–99)
Glucose-Capillary: 104 mg/dL — ABNORMAL HIGH (ref 70–99)
Glucose-Capillary: 115 mg/dL — ABNORMAL HIGH (ref 70–99)
Glucose-Capillary: 116 mg/dL — ABNORMAL HIGH (ref 70–99)
Glucose-Capillary: 152 mg/dL — ABNORMAL HIGH (ref 70–99)

## 2024-06-14 LAB — CBC
HCT: 26.4 % — ABNORMAL LOW (ref 36.0–46.0)
Hemoglobin: 8 g/dL — ABNORMAL LOW (ref 12.0–15.0)
MCH: 23.3 pg — ABNORMAL LOW (ref 26.0–34.0)
MCHC: 30.3 g/dL (ref 30.0–36.0)
MCV: 77 fL — ABNORMAL LOW (ref 80.0–100.0)
Platelets: 303 10*3/uL (ref 150–400)
RBC: 3.43 MIL/uL — ABNORMAL LOW (ref 3.87–5.11)
RDW: 14.5 % (ref 11.5–15.5)
WBC: 6.1 10*3/uL (ref 4.0–10.5)
nRBC: 0 % (ref 0.0–0.2)

## 2024-06-14 MED ORDER — TRAMADOL HCL 50 MG PO TABS: 50.0000 mg | ORAL_TABLET | Freq: Four times a day (QID) | ORAL | 0 refills | Status: DC | PRN

## 2024-06-14 MED ORDER — TRAMADOL HCL 50 MG PO TABS
50.0000 mg | ORAL_TABLET | Freq: Four times a day (QID) | ORAL | 0 refills | Status: AC | PRN
  Filled 2024-06-14: qty 30, 8d supply, fill #0

## 2024-06-14 MED ORDER — OXYCODONE HCL 5 MG PO TABS: 2.5000 mg | ORAL_TABLET | ORAL | 0 refills | Status: DC | PRN

## 2024-06-14 MED ORDER — ENOXAPARIN SODIUM 40 MG/0.4ML IJ SOSY
40.0000 mg | PREFILLED_SYRINGE | INTRAMUSCULAR | 0 refills | Status: AC
  Filled 2024-06-14: qty 11.2, 28d supply, fill #0

## 2024-06-14 MED ORDER — POLYETHYLENE GLYCOL 3350 17 GM/SCOOP PO POWD
17.0000 g | Freq: Every day | ORAL | 0 refills | Status: AC
  Filled 2024-06-14: qty 238, 14d supply, fill #0

## 2024-06-14 MED ORDER — FERROUS SULFATE 325 (65 FE) MG PO TABS
325.0000 mg | ORAL_TABLET | ORAL | 0 refills | Status: AC
  Filled 2024-06-14: qty 60, 120d supply, fill #0

## 2024-06-14 MED ORDER — LEVETIRACETAM 500 MG PO TABS
500.0000 mg | ORAL_TABLET | Freq: Two times a day (BID) | ORAL | 0 refills | Status: AC
  Filled 2024-06-14: qty 12, 6d supply, fill #0

## 2024-06-14 MED ORDER — OXYCODONE HCL 5 MG PO TABS
2.5000 mg | ORAL_TABLET | ORAL | 0 refills | Status: AC | PRN
  Filled 2024-06-14: qty 30, 5d supply, fill #0

## 2024-06-14 MED ORDER — ENOXAPARIN SODIUM 40 MG/0.4ML IJ SOSY: 40.0000 mg | PREFILLED_SYRINGE | INTRAMUSCULAR | 0 refills | Status: DC

## 2024-06-14 NOTE — Discharge Instructions (Addendum)

## 2025-04-29 ENCOUNTER — Ambulatory Visit: Admitting: Urology
